# Patient Record
Sex: Female | Born: 1980 | Race: White | Hispanic: Yes | Marital: Married | Smoking: Never smoker
Health system: Southern US, Community
[De-identification: ages and names within clinical notes are randomized; demographics above are authoritative.]

## PROBLEM LIST (undated history)

## (undated) ENCOUNTER — Inpatient Hospital Stay (HOSPITAL_COMMUNITY): Payer: Self-pay

## (undated) DIAGNOSIS — G939 Disorder of brain, unspecified: Secondary | ICD-10-CM

## (undated) DIAGNOSIS — L723 Sebaceous cyst: Secondary | ICD-10-CM

## (undated) DIAGNOSIS — H02409 Unspecified ptosis of unspecified eyelid: Secondary | ICD-10-CM

## (undated) DIAGNOSIS — O24419 Gestational diabetes mellitus in pregnancy, unspecified control: Secondary | ICD-10-CM

## (undated) DIAGNOSIS — E559 Vitamin D deficiency, unspecified: Secondary | ICD-10-CM

## (undated) DIAGNOSIS — E039 Hypothyroidism, unspecified: Secondary | ICD-10-CM

## (undated) DIAGNOSIS — Z8632 Personal history of gestational diabetes: Secondary | ICD-10-CM

## (undated) HISTORY — DX: Personal history of gestational diabetes: Z86.32

## (undated) HISTORY — DX: Disorder of brain, unspecified: G93.9

## (undated) HISTORY — DX: Hypothyroidism, unspecified: E03.9

## (undated) HISTORY — PX: EYE SURGERY: SHX253

## (undated) HISTORY — DX: Vitamin D deficiency, unspecified: E55.9

## (undated) HISTORY — DX: Unspecified ptosis of unspecified eyelid: H02.409

## (undated) HISTORY — DX: Sebaceous cyst: L72.3

---

## 2004-06-01 ENCOUNTER — Encounter: Admission: RE | Admit: 2004-06-01 | Discharge: 2004-06-01 | Payer: Self-pay | Admitting: Obstetrics and Gynecology

## 2004-08-10 ENCOUNTER — Ambulatory Visit (HOSPITAL_COMMUNITY): Admission: RE | Admit: 2004-08-10 | Discharge: 2004-08-10 | Payer: Self-pay | Admitting: *Deleted

## 2004-08-10 ENCOUNTER — Ambulatory Visit: Payer: Self-pay | Admitting: *Deleted

## 2004-08-12 ENCOUNTER — Inpatient Hospital Stay (HOSPITAL_COMMUNITY): Admission: AD | Admit: 2004-08-12 | Discharge: 2004-08-12 | Payer: Self-pay | Admitting: Obstetrics & Gynecology

## 2004-08-14 ENCOUNTER — Inpatient Hospital Stay (HOSPITAL_COMMUNITY): Admission: AD | Admit: 2004-08-14 | Discharge: 2004-08-16 | Payer: Self-pay | Admitting: Obstetrics & Gynecology

## 2004-09-06 ENCOUNTER — Inpatient Hospital Stay (HOSPITAL_COMMUNITY): Admission: AD | Admit: 2004-09-06 | Discharge: 2004-09-06 | Payer: Self-pay | Admitting: Obstetrics and Gynecology

## 2006-06-13 ENCOUNTER — Emergency Department (HOSPITAL_COMMUNITY): Admission: EM | Admit: 2006-06-13 | Discharge: 2006-06-13 | Payer: Self-pay | Admitting: Family Medicine

## 2006-07-22 ENCOUNTER — Encounter (INDEPENDENT_AMBULATORY_CARE_PROVIDER_SITE_OTHER): Payer: Self-pay | Admitting: *Deleted

## 2006-08-01 ENCOUNTER — Ambulatory Visit: Payer: Self-pay | Admitting: Family Medicine

## 2007-01-18 DIAGNOSIS — N926 Irregular menstruation, unspecified: Secondary | ICD-10-CM | POA: Insufficient documentation

## 2007-01-18 DIAGNOSIS — G5600 Carpal tunnel syndrome, unspecified upper limb: Secondary | ICD-10-CM | POA: Insufficient documentation

## 2007-01-19 ENCOUNTER — Encounter (INDEPENDENT_AMBULATORY_CARE_PROVIDER_SITE_OTHER): Payer: Self-pay | Admitting: *Deleted

## 2007-02-21 ENCOUNTER — Encounter: Payer: Self-pay | Admitting: Family Medicine

## 2007-02-27 ENCOUNTER — Ambulatory Visit: Payer: Self-pay | Admitting: Family Medicine

## 2007-02-27 DIAGNOSIS — L723 Sebaceous cyst: Secondary | ICD-10-CM | POA: Insufficient documentation

## 2007-02-27 HISTORY — DX: Sebaceous cyst: L72.3

## 2007-02-27 LAB — CONVERTED CEMR LAB
Hemoglobin: 14.1 g/dL
MCV: 86.8 fL
RBC: 4.77 M/uL
WBC: 7 10*3/uL

## 2007-03-01 LAB — CONVERTED CEMR LAB: Glucose, Bld: 93 mg/dL (ref 70–99)

## 2007-04-10 ENCOUNTER — Telehealth: Payer: Self-pay | Admitting: *Deleted

## 2007-04-11 ENCOUNTER — Ambulatory Visit: Payer: Self-pay | Admitting: Family Medicine

## 2007-04-11 LAB — CONVERTED CEMR LAB
Glucose, Urine, Semiquant: NEGATIVE
Ketones, urine, test strip: NEGATIVE
Nitrite: NEGATIVE
Urobilinogen, UA: 0.2
pH: 6

## 2007-08-07 ENCOUNTER — Encounter: Payer: Self-pay | Admitting: Family Medicine

## 2007-08-07 ENCOUNTER — Ambulatory Visit: Payer: Self-pay | Admitting: Family Medicine

## 2007-08-10 ENCOUNTER — Encounter: Payer: Self-pay | Admitting: Family Medicine

## 2008-04-03 ENCOUNTER — Telehealth: Payer: Self-pay | Admitting: *Deleted

## 2008-04-04 ENCOUNTER — Encounter (INDEPENDENT_AMBULATORY_CARE_PROVIDER_SITE_OTHER): Payer: Self-pay | Admitting: *Deleted

## 2008-04-15 ENCOUNTER — Telehealth: Payer: Self-pay | Admitting: *Deleted

## 2008-04-18 ENCOUNTER — Ambulatory Visit: Payer: Self-pay | Admitting: Family Medicine

## 2008-04-18 ENCOUNTER — Encounter (INDEPENDENT_AMBULATORY_CARE_PROVIDER_SITE_OTHER): Payer: Self-pay | Admitting: *Deleted

## 2008-04-18 DIAGNOSIS — N898 Other specified noninflammatory disorders of vagina: Secondary | ICD-10-CM | POA: Insufficient documentation

## 2008-04-18 DIAGNOSIS — N912 Amenorrhea, unspecified: Secondary | ICD-10-CM

## 2008-04-18 LAB — CONVERTED CEMR LAB
Beta hcg, urine, semiquantitative: NEGATIVE
GC Probe Amp, Genital: NEGATIVE
Whiff Test: NEGATIVE

## 2008-04-21 ENCOUNTER — Encounter (INDEPENDENT_AMBULATORY_CARE_PROVIDER_SITE_OTHER): Payer: Self-pay | Admitting: *Deleted

## 2008-06-19 ENCOUNTER — Ambulatory Visit: Payer: Self-pay | Admitting: Family Medicine

## 2008-06-19 ENCOUNTER — Telehealth: Payer: Self-pay | Admitting: *Deleted

## 2008-09-30 ENCOUNTER — Ambulatory Visit: Payer: Self-pay | Admitting: Gynecology

## 2008-09-30 ENCOUNTER — Encounter: Payer: Self-pay | Admitting: Gynecology

## 2008-09-30 ENCOUNTER — Other Ambulatory Visit: Admission: RE | Admit: 2008-09-30 | Discharge: 2008-09-30 | Payer: Self-pay | Admitting: Gynecology

## 2008-12-09 ENCOUNTER — Ambulatory Visit: Payer: Self-pay | Admitting: Gynecology

## 2008-12-12 ENCOUNTER — Ambulatory Visit: Payer: Self-pay | Admitting: Gynecology

## 2008-12-16 ENCOUNTER — Ambulatory Visit: Payer: Self-pay | Admitting: Gynecology

## 2009-01-14 ENCOUNTER — Encounter: Payer: Self-pay | Admitting: Family Medicine

## 2009-03-03 ENCOUNTER — Ambulatory Visit (HOSPITAL_COMMUNITY): Admission: RE | Admit: 2009-03-03 | Discharge: 2009-03-03 | Payer: Self-pay | Admitting: Family Medicine

## 2009-07-03 ENCOUNTER — Ambulatory Visit: Payer: Self-pay | Admitting: Obstetrics & Gynecology

## 2009-07-09 ENCOUNTER — Inpatient Hospital Stay (HOSPITAL_COMMUNITY): Admission: AD | Admit: 2009-07-09 | Discharge: 2009-07-12 | Payer: Self-pay | Admitting: Family Medicine

## 2009-07-09 ENCOUNTER — Ambulatory Visit: Payer: Self-pay | Admitting: Obstetrics & Gynecology

## 2009-07-10 ENCOUNTER — Encounter: Payer: Self-pay | Admitting: Obstetrics & Gynecology

## 2009-11-13 ENCOUNTER — Telehealth: Payer: Self-pay | Admitting: Family Medicine

## 2010-05-21 ENCOUNTER — Ambulatory Visit: Payer: Self-pay | Admitting: Gynecology

## 2010-05-21 ENCOUNTER — Other Ambulatory Visit: Admission: RE | Admit: 2010-05-21 | Discharge: 2010-05-21 | Payer: Self-pay | Admitting: Gynecology

## 2010-08-12 ENCOUNTER — Ambulatory Visit: Payer: Self-pay | Admitting: Gynecology

## 2010-09-03 ENCOUNTER — Ambulatory Visit: Payer: Self-pay | Admitting: Gynecology

## 2010-10-06 ENCOUNTER — Ambulatory Visit: Payer: Self-pay | Admitting: Gynecology

## 2010-12-26 ENCOUNTER — Encounter: Payer: Self-pay | Admitting: *Deleted

## 2011-01-31 ENCOUNTER — Ambulatory Visit (INDEPENDENT_AMBULATORY_CARE_PROVIDER_SITE_OTHER): Payer: Self-pay | Admitting: Family Medicine

## 2011-01-31 ENCOUNTER — Encounter: Payer: Self-pay | Admitting: Family Medicine

## 2011-01-31 VITALS — BP 105/71 | HR 75 | Temp 98.0°F | Ht 63.0 in | Wt 163.0 lb

## 2011-01-31 DIAGNOSIS — L290 Pruritus ani: Secondary | ICD-10-CM | POA: Insufficient documentation

## 2011-01-31 MED ORDER — HYDROCORTISONE 2.5 % EX CREA: TOPICAL_CREAM | Freq: Two times a day (BID) | CUTANEOUS | Status: DC

## 2011-01-31 NOTE — Progress Notes (Signed)
  Subjective:    Patient ID: Shelly Greene, female    DOB: 04-09-1981, 30 y.o.   MRN: [id number]  HPI  Rectal itching, comes and goes, worried that she has pin worms.  Itching at night but also during the day, hemorrhoids in past with tags.  Children do not have symptoms.    Review of Systems  Constitutional: Negative for fever.  Genitourinary: Negative for dysuria and vaginal discharge.       Objective:   Physical Exam  Constitutional: She appears well-developed and well-nourished.  Genitourinary:       Rectal exam:  Small tags and minor inflammation          Assessment & Plan:

## 2011-01-31 NOTE — Patient Instructions (Signed)
Clean well with water or wipes (Tucks) Use hydrocortisone for 1-2 weeks then as needed

## 2011-01-31 NOTE — Assessment & Plan Note (Signed)
Discussed cleaning after BM, recommended Tucks, added prn hydrocortisone 2.5%

## 2011-02-26 LAB — CBC
HCT: 35.3 % — ABNORMAL LOW (ref 36.0–46.0)
MCHC: 33.2 g/dL (ref 30.0–36.0)
MCV: 89.6 fL (ref 78.0–100.0)
RDW: 13.6 % (ref 11.5–15.5)

## 2011-04-05 NOTE — Op Note (Signed)
NAME:  Shelly Greene, Shelly Greene         ACCOUNT NO.:  [id number]   MEDICAL RECORD NO.:  [id number]          PATIENT TYPE:  INP   LOCATION:  9124                          FACILITY:  [facility]   PHYSICIAN:  Allie Bossier, MD        DATE OF BIRTH:  Feb 03, 1981   DATE OF PROCEDURE:  07/10/2009  DATE OF DISCHARGE:                               OPERATIVE REPORT   PREOPERATIVE DIAGNOSIS:  Retained placenta after vacuum-assisted vaginal  delivery.   POSTOPERATIVE DIAGNOSIS:  Retained placenta after vacuum-assisted  vaginal delivery.   PROCEDURE:  Manual extraction of placenta.   ANESTHESIA:  Epidural.   ANESTHESIOLOGIST:  Belva Agee, MD   COMPLICATIONS:  None.   ESTIMATED BLOOD LOSS:  800 mL.   SPECIMENS:  Placenta.   DETAILED PROCEDURE AND FINDINGS:  Risks, benefits, and alternatives of  surgery were explained, understood, and accepted.  Consents were signed.  She was taken to the operating room.  Her epidural was bolused for  surgery.  Her vagina was prepped and draped in usual sterile fashion  after adequate anesthesia was assured, I reached then and pulled the  placenta out.  During the prepping process, it was obvious that she was  having some bleeding.  She bled about 300 mL during the vaginal prepping  and then immediately after the placenta was delivered, she bled another  300 or 400 mL.  Pitocin and Methergine quickly controlled this vaginal  bleeding.  The uterus firmed up nicely.  She was taken to recovery room  in stable condition.  Instrument, sponge, and needle counts were  correct.  She tolerated the procedure well.  Please note that I believe  she has the uterine abnormality.  This child and her last child were  both carried on her right hand side.  Leopold's showed no evidence of  baby anywhere near the left side of her abdomen.  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  She was  taken to recovery room in stable condition.      Allie Bossier, MD  Electronically Signed     MCD/MEDQ  D:  07/10/2009  T:  07/11/2009  Job:  [id number]

## 2011-04-11 ENCOUNTER — Ambulatory Visit (INDEPENDENT_AMBULATORY_CARE_PROVIDER_SITE_OTHER): Payer: 59 | Admitting: Family Medicine

## 2011-04-11 ENCOUNTER — Encounter: Payer: Self-pay | Admitting: Family Medicine

## 2011-04-11 VITALS — BP 107/75 | HR 73 | Temp 100.4°F | Ht 63.0 in | Wt 154.9 lb

## 2011-04-11 DIAGNOSIS — J020 Streptococcal pharyngitis: Secondary | ICD-10-CM

## 2011-04-11 DIAGNOSIS — J029 Acute pharyngitis, unspecified: Secondary | ICD-10-CM

## 2011-04-11 LAB — POCT RAPID STREP A (OFFICE): Rapid Strep A Screen: POSITIVE — AB

## 2011-04-11 MED ORDER — AMOXICILLIN-POT CLAVULANATE 875-125 MG PO TABS: 1.0000 | ORAL_TABLET | Freq: Two times a day (BID) | ORAL | Status: AC

## 2011-04-11 NOTE — Patient Instructions (Signed)
Take the medication for 1 week.  We will do a referral for you to talk with an Ears Nose Throat doctor to discuss getting a tonsillectomy.

## 2011-04-11 NOTE — Assessment & Plan Note (Addendum)
Pt has a strep throat. She is concerned about having it so often (has had it multiple times in her lifetime and twice in the last 1 year). She is interested in talking to an ENT MD to discuss tonsillectomy.  ENT referral done.  Pt will take Augmentin for the infection and Chloraseptic spray for the pain.

## 2011-04-11 NOTE — Progress Notes (Signed)
Throat pain: Pt started having some throat pain over the weekend (3 days). Her son was sick 4-5 days ago with a sore throat as well but he never had a hard time eating and already feels better and is back in school. She is having a lot of pain with swallowing, has a fever x 1 day and is currently febrile to 100.4. She has a h/o of strep pharyngitis and has to often get antibiotics.   ROS: neg except as noted in HPI.   PE:  Gen: pt is sittng comfortably, has a speech that sound slightly "hot potatoe" voice.  HEENT: NA/AT, EOMI, Ears have clear canals without TM bulging bilaterally, large swollen tonsils with exudates seen, LAD in tonsillar lymph nodes noted CV: RRR, no murmur Pulm: CTAB, no wheezing or crackles.

## 2011-05-24 ENCOUNTER — Encounter (INDEPENDENT_AMBULATORY_CARE_PROVIDER_SITE_OTHER): Payer: 59 | Admitting: Gynecology

## 2011-05-24 ENCOUNTER — Other Ambulatory Visit: Payer: Self-pay | Admitting: Gynecology

## 2011-05-24 ENCOUNTER — Other Ambulatory Visit (HOSPITAL_COMMUNITY)
Admission: RE | Admit: 2011-05-24 | Discharge: 2011-05-24 | Disposition: A | Discharge status: Home / Self Care | Payer: 59 | Source: Ambulatory Visit | Attending: Gynecology | Admitting: Gynecology

## 2011-05-24 DIAGNOSIS — Z833 Family history of diabetes mellitus: Secondary | ICD-10-CM

## 2011-05-24 DIAGNOSIS — Z124 Encounter for screening for malignant neoplasm of cervix: Secondary | ICD-10-CM | POA: Insufficient documentation

## 2011-05-24 DIAGNOSIS — Z01419 Encounter for gynecological examination (general) (routine) without abnormal findings: Secondary | ICD-10-CM

## 2011-06-08 ENCOUNTER — Ambulatory Visit: Payer: 59 | Admitting: Gynecology

## 2011-06-08 ENCOUNTER — Other Ambulatory Visit (INDEPENDENT_AMBULATORY_CARE_PROVIDER_SITE_OTHER): Payer: 59

## 2011-06-08 DIAGNOSIS — O99810 Abnormal glucose complicating pregnancy: Secondary | ICD-10-CM

## 2011-06-08 DIAGNOSIS — R82998 Other abnormal findings in urine: Secondary | ICD-10-CM

## 2011-06-08 DIAGNOSIS — Z13220 Encounter for screening for lipoid disorders: Secondary | ICD-10-CM

## 2011-06-08 DIAGNOSIS — E039 Hypothyroidism, unspecified: Secondary | ICD-10-CM

## 2011-10-19 ENCOUNTER — Other Ambulatory Visit: Payer: Self-pay | Admitting: Gynecology

## 2012-01-04 ENCOUNTER — Other Ambulatory Visit (INDEPENDENT_AMBULATORY_CARE_PROVIDER_SITE_OTHER): Payer: Self-pay | Admitting: Otolaryngology

## 2012-01-04 DIAGNOSIS — R42 Dizziness and giddiness: Secondary | ICD-10-CM

## 2012-01-10 ENCOUNTER — Ambulatory Visit (INDEPENDENT_AMBULATORY_CARE_PROVIDER_SITE_OTHER): Payer: 59 | Admitting: Family Medicine

## 2012-01-10 ENCOUNTER — Encounter: Payer: Self-pay | Admitting: Family Medicine

## 2012-01-10 ENCOUNTER — Telehealth: Payer: Self-pay | Admitting: Family Medicine

## 2012-01-10 VITALS — BP 110/73 | HR 91 | Temp 99.3°F | Ht 63.0 in | Wt 165.0 lb

## 2012-01-10 DIAGNOSIS — B950 Streptococcus, group A, as the cause of diseases classified elsewhere: Secondary | ICD-10-CM

## 2012-01-10 DIAGNOSIS — J029 Acute pharyngitis, unspecified: Secondary | ICD-10-CM

## 2012-01-10 DIAGNOSIS — J020 Streptococcal pharyngitis: Secondary | ICD-10-CM

## 2012-01-10 LAB — POCT RAPID STREP A (OFFICE): Rapid Strep A Screen: POSITIVE — AB

## 2012-01-10 MED ORDER — AMOXICILLIN 875 MG PO TABS: 875.0000 mg | ORAL_TABLET | Freq: Two times a day (BID) | ORAL | Status: AC

## 2012-01-10 MED ORDER — CHLORPHENIRAMINE-PSEUDOEPH 12-100 MG PO CP24: 1.0000 | ORAL_CAPSULE | Freq: Two times a day (BID) | ORAL | Status: AC

## 2012-01-10 MED ORDER — CHLORPHENIRAMINE-PSEUDOEPH 12-100 MG PO CP24: 1.0000 | ORAL_CAPSULE | Freq: Two times a day (BID) | ORAL | Status: DC

## 2012-01-10 MED ORDER — AMOXICILLIN-POT CLAVULANATE 875-125 MG PO TABS: 1.0000 | ORAL_TABLET | Freq: Two times a day (BID) | ORAL | Status: DC

## 2012-01-10 NOTE — Assessment & Plan Note (Signed)
Although patient had a cough making strep low on the differential, rapid strep was done as point of care triage lab and was found to be positive. Was initially planning on treating for sinusitis since symptoms have been lasting for 3 weeks with waxing and waning potentially with a second sickness syndrome. 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

## 2012-01-10 NOTE — Telephone Encounter (Signed)
Called patient and appointment is scheduled for 4:00 PM today.

## 2012-01-10 NOTE — Telephone Encounter (Signed)
Has fever/chills/congestion/cough - getting worse - has had for 2 weeks Wants to know what she should do. No appt available today

## 2012-01-10 NOTE — Patient Instructions (Signed)
Since you have been having symptoms for 3 weeks, this could be a bacterial sinusitis that we are going to treat with antibiotics that you need to take for 5 days twice daily. Also, you can take a pill called chlorpheniramine with pseudoephedrine that will help with the congestion.

## 2012-01-10 NOTE — Progress Notes (Signed)
Patient ID: Shelly Greene, female   DOB: 09/04/1981, 31 y.o.   MRN: [id number] Patient ID: Shelly Greene    DOB: Apr 08, 1981, 31 y.o.   MRN: [id number] --- Subjective:  Shelly Greene is a 31 y.o.female who presents with 3 week history of cough, congestion, chest congestion and who started having chills, body aches and temporal headache this morning. She has not taken her temperature but thinks she has had chills. She has been eating and drinking normally. Feels like the symptoms wax and wane in terms of severity. Has taken ibuprofen which has stopped being as effective for headache. Started taking zyrtec a couple of days ago. She has not had the flu shot this season.    Objective: Filed Vitals:   01/10/12 1610  BP: 110/73  Pulse: 91  Temp: 99.3 F (37.4 C)    Physical Examination:   General appearance - alert, well appearing, and in no distress. Visibly congested.  Ears - bilateral TM's and external ear canals normal, TM a little dull but no pus seen Nose - normal and patent, no erythema, erythematous turbinates with drainage Mouth - mucous membranes moist, pharynx normal without lesions, no tonsillar exudates, erythematous oropharynx. Neck - supple, no significant adenopathy Face: no pain along sinuses Chest - clear to auscultation, no wheezes, rales or rhonchi, symmetric air entry Heart - normal rate, regular rhythm, normal S1, S2, no murmurs, rubs, clicks or gallops Abdomen - soft, nontender, nondistended, no masses or organomegaly Extremities - peripheral pulses normal, no pedal edema, no clubbing or cyanosis

## 2012-01-11 ENCOUNTER — Other Ambulatory Visit: Payer: 59

## 2012-01-19 ENCOUNTER — Ambulatory Visit
Admission: RE | Admit: 2012-01-19 | Discharge: 2012-01-19 | Disposition: A | Discharge status: Home / Self Care | Payer: 59 | Source: Ambulatory Visit | Attending: Otolaryngology | Admitting: Otolaryngology

## 2012-01-19 DIAGNOSIS — R42 Dizziness and giddiness: Secondary | ICD-10-CM

## 2012-01-19 MED ORDER — GADOBENATE DIMEGLUMINE 529 MG/ML IV SOLN
15.0000 mL | Freq: Once | INTRAVENOUS | Status: AC | PRN
  Administered 2012-01-19: 15 mL via INTRAVENOUS

## 2012-01-23 ENCOUNTER — Other Ambulatory Visit: Payer: Self-pay | Admitting: Gynecology

## 2012-03-01 ENCOUNTER — Telehealth: Payer: Self-pay | Admitting: Family Medicine

## 2012-03-01 NOTE — Telephone Encounter (Signed)
Called pt and she reports, that she has been taking the amoxicillin and finished it. Still congested. Denies fever, sore throat. I advised the pt that we do not prescribe ABX without office visit. Pt does not want to be seen. Advised pt to try OTC medications for congestion. Pt agreed. Lorenda Hatchet, Renato Battles

## 2012-03-01 NOTE — Telephone Encounter (Signed)
Patient says she was seen about a month ago and was given a rx for sinuses but she never got it filled and has lost rx.  She wants to know if she can get another prescription because she is still having sinus problems.

## 2012-03-22 ENCOUNTER — Other Ambulatory Visit: Payer: Self-pay | Admitting: *Deleted

## 2012-03-22 DIAGNOSIS — G9389 Other specified disorders of brain: Secondary | ICD-10-CM

## 2012-03-29 ENCOUNTER — Ambulatory Visit
Admission: RE | Admit: 2012-03-29 | Discharge: 2012-03-29 | Disposition: A | Discharge status: Home / Self Care | Payer: 59 | Source: Ambulatory Visit | Attending: *Deleted | Admitting: *Deleted

## 2012-03-29 ENCOUNTER — Other Ambulatory Visit: Payer: 59

## 2012-03-29 DIAGNOSIS — G9389 Other specified disorders of brain: Secondary | ICD-10-CM

## 2012-03-29 MED ORDER — GADOBENATE DIMEGLUMINE 529 MG/ML IV SOLN
15.0000 mL | Freq: Once | INTRAVENOUS | Status: AC | PRN
  Administered 2012-03-29: 15 mL via INTRAVENOUS

## 2012-03-31 ENCOUNTER — Ambulatory Visit
Admission: RE | Admit: 2012-03-31 | Discharge: 2012-03-31 | Disposition: A | Discharge status: Home / Self Care | Payer: 59 | Source: Ambulatory Visit | Attending: *Deleted | Admitting: *Deleted

## 2012-03-31 DIAGNOSIS — G9389 Other specified disorders of brain: Secondary | ICD-10-CM

## 2012-03-31 MED ORDER — GADOBENATE DIMEGLUMINE 529 MG/ML IV SOLN
15.0000 mL | Freq: Once | INTRAVENOUS | Status: AC | PRN
  Administered 2012-03-31: 15 mL via INTRAVENOUS

## 2012-05-22 ENCOUNTER — Encounter: Payer: Self-pay | Admitting: Family Medicine

## 2012-05-22 ENCOUNTER — Ambulatory Visit (INDEPENDENT_AMBULATORY_CARE_PROVIDER_SITE_OTHER): Payer: 59 | Admitting: Family Medicine

## 2012-05-22 VITALS — BP 99/63 | HR 87 | Temp 98.0°F | Ht 63.0 in | Wt 158.3 lb

## 2012-05-22 DIAGNOSIS — J029 Acute pharyngitis, unspecified: Secondary | ICD-10-CM

## 2012-05-22 DIAGNOSIS — J020 Streptococcal pharyngitis: Secondary | ICD-10-CM

## 2012-05-22 LAB — POCT RAPID STREP A (OFFICE): Rapid Strep A Screen: POSITIVE — AB

## 2012-05-22 MED ORDER — AMOXICILLIN 875 MG PO TABS
875.0000 mg | ORAL_TABLET | Freq: Two times a day (BID) | ORAL | Status: AC
Start: 2012-05-22 — End: 2012-06-01

## 2012-05-22 NOTE — Assessment & Plan Note (Addendum)
Today's presentation clinically consistent with strep pharyngitis.  Will treat amoxicillin 875 bid x 10 days.  Discussed with patient, may consider testing rapid strep next time she is in the office without sore throat to determine if she is a carrier- may be able to avoid repeated antibiotics for cases more consistent with viral pharyngitis.

## 2012-05-22 NOTE — Patient Instructions (Addendum)

## 2012-05-22 NOTE — Progress Notes (Signed)
  Subjective:    Patient ID: Shelly Greene, female    DOB: 1981-07-15, 31 y.o.   MRN: [id number]  HPI Work in for sore throat  Started last night. Woke up this morning with worsening soreness.  No fever, chills, cough.  Feels postnasal drainage, bad taste in mouth.  Has history of positive strep in February with 10 day course of amoxicillin.  Also had a positive in May 2012.  No household contacts sick.    Review of Systemssee HPI     Objective:   Physical Exam GEN: Alert & Oriented, No acute distress HEENT: [place]/AT. EOMI, PERRLA, no conjunctival injection or scleral icterus.  Bilateral tympanic membranes intact without erythema or effusion.  .  Nares without edema or rhinorrhea.  Oropharynx is with erythema , tonsillar swelling,  Left tonsillar exudate.  No anterior or posterior cervical lymphadenopathy. CV:  Regular Rate & Rhythm, no murmur Respiratory:  Normal work of breathing, CTAB Abd:  + BS, soft, no tenderness to palpation Ext: no pre-tibial edema       Assessment & Plan:

## 2012-08-06 ENCOUNTER — Ambulatory Visit (INDEPENDENT_AMBULATORY_CARE_PROVIDER_SITE_OTHER): Payer: 59 | Admitting: Gynecology

## 2012-08-06 ENCOUNTER — Encounter: Payer: Self-pay | Admitting: Gynecology

## 2012-08-06 VITALS — BP 104/72 | Ht 64.0 in | Wt 151.0 lb

## 2012-08-06 DIAGNOSIS — E039 Hypothyroidism, unspecified: Secondary | ICD-10-CM

## 2012-08-06 DIAGNOSIS — G9389 Other specified disorders of brain: Secondary | ICD-10-CM

## 2012-08-06 DIAGNOSIS — G939 Disorder of brain, unspecified: Secondary | ICD-10-CM | POA: Insufficient documentation

## 2012-08-06 DIAGNOSIS — Z01419 Encounter for gynecological examination (general) (routine) without abnormal findings: Secondary | ICD-10-CM

## 2012-08-06 DIAGNOSIS — R635 Abnormal weight gain: Secondary | ICD-10-CM

## 2012-08-06 LAB — CBC WITH DIFFERENTIAL/PLATELET
Basophils Absolute: 0.1 10*3/uL (ref 0.0–0.1)
Eosinophils Absolute: 0.6 10*3/uL (ref 0.0–0.7)
Eosinophils Relative: 10 % — ABNORMAL HIGH (ref 0–5)
Lymphocytes Relative: 33 % (ref 12–46)
MCH: 29.5 pg (ref 26.0–34.0)
MCV: 86.5 fL (ref 78.0–100.0)
Neutrophils Relative %: 49 % (ref 43–77)
Platelets: 216 10*3/uL (ref 150–400)
RDW: 13.3 % (ref 11.5–15.5)
WBC: 5.8 10*3/uL (ref 4.0–10.5)

## 2012-08-06 LAB — LIPID PANEL: LDL Cholesterol: 113 mg/dL — ABNORMAL HIGH (ref 0–99)

## 2012-08-06 NOTE — Patient Instructions (Addendum)
Control del colesterol  Los niveles de colesterol en el organismo estn determinados significativamente por su dieta. Los niveles de colesterol tambin se relacionan con la enfermedad cardaca. El material que sigue ayuda a explicar esta relacin y a analizar qu puede hacer para mantener su corazn sano. No todo el colesterol es malo. Las lipoprotenas de baja densidad (LDL) forman el colesterol "malo". El colesterol malo puede ocasionar depsitos de grasa que se acumulan en el interior de las arterias. Las lipoprotenas de alta densidad (HDL) es el colesterol "bueno". Ayuda a remover el colesterol LDL "malo" de la sangre. El colesterol es un factor de riesgo muy importante para la enfermedad cardaca. Otros factores de riesgo son la hipertensin arterial, el hbito de fumar, el estrs, la herencia y el peso.  El msculo cardaco obtiene el suministro de sangre a travs de las arterias coronarias. Si su colesterol LDL ("malo") est elevado y el HDL ("bueno") es bajo, tiene un factor de riesgo para que se formen depsitos de grasa en las arterias coronarias (los vasos sanguneos que suministran sangre al corazn). Esto hace que haya menos lugar para que la sangre circule. Sin la suficiente sangre y oxgeno, el msculo cardaco no puede funcionar correctamente, y usted podr sentir dolores en el pecho (angina pectoris). Cuando una arteria coronaria se cierra completamente, una parte del msculo cardaco puede morir (infarto de miocardio). CONTROL DEL COLESTEROL Cuando el profesional que lo asiste enva la sangre al laboratorio para conocer el nivel de colesterol, puede realizarle tambin un perfil completo de los lpidos. Con esta prueba, se puede determinar la cantidad total de colesterol, as como los niveles de LDL y HDL. Los triglicridos son un tipo de grasa que circula en la sangre y que tambin puede utilizarse para determinar el riesgo de enfermedad  cardaca. En la siguiente tabla se establecen los nmeros ideales: Prueba: Colesterol total  Menos de 200 mg/dl.  Prueba: LDL "colesterol malo"  Menos de 100 mg/dl.   Menos de 70 mg/dl si tiene riesgo muy elevado de sufrir un ataque cardaco o muerte cardaca sbita.  Prueba: HDL "colesterol bueno"  Mujeres: Ms de 50 mg/dl.   Hombres: Ms de 40 mg/dl.  Prueba: Trigliceridos  Menos de 150 mg/dl.  CONTROL DEL COLESTEROL CON DIETA Aunque factores como el ejercicio y el estilo de vida son importantes, la "primera lnea de ataque" es la dieta. Esto se debe a que se sabe que ciertos alimentos hacen subir el colesterol y otros lo bajan. El objetivo debe ser equilibrar los alimentos, de modo que tengan un efecto sobre el colesterol y, an ms importante, reemplazar las grasas saturadas y trans con otros tipos de grasas, como las monoinsaturadas y las poliinsaturadas y cidos grasos omega-3 . En promedio, una persona no debe consumir ms de 15 a 17 g de grasas saturadas por da. Las grasas saturadas y trans se consideran grasas "malas", ya que elevan el colesterol LDL. Las grasas saturadas se encuentran principalmente en productos animales como carne, manteca y crema. Pero esto no significa que usted debe sacrificar todas sus comidas favoritas. Actualmente, como lo muestra el cuadro que figura al final de este documento, hay sustitutos de buen sabor, bajos en grasas y en colesterol, para la mayora de los alimentos que a usted le gusta comer. Elija aquellos alimentos alternativos que sean bajos en grasas o sin grasas. Elija cortes de carne del cuarto trasero o lomo ya que estos cortes son los que tienen menor cantidad de grasa y colesterol. El pollo (  sin piel), el pescado, la carne de ternera, y la pechuga de pavo molida son excelentes opciones. Elimine las carnes grasosas como los hotdogs o el salami. Los mariscos tienen poco o nada de grasas saturadas. Cuando consuma carne magra, carne de aves de  corral, o pescado, hgalo en porciones de 85 gramos (3 onzas). Las grasas trans tambin se llaman "aceites parcialmente hidrogenados". Son aceites manipulados cientficamente de modo que son slidos a temperatura ambiente, tienen una larga vida y mejoran el sabor y la textura de los alimentos a los que se agregan. Las grasas trans se encuentran en la margarina, masitas, crackers y alimentos horneados.  Para hornear y cocinar, el aceite es un excelente sustituto para la mantequilla. Los aceites monoinsaturados tienen un beneficio particular, ya que se cree que disminuyen el colesterol LDL (colesterol malo) y elevan el HDL. Deber evitar los aceites tropicales saturados como el de coco y el de palma.  Recuerde, adems, que puede comer sin restricciones los grupos de alimentos que son naturalmente libres de grasas saturadas y grasas trans, entre los que se incluyen el pescado, las frutas (excepto el aguacate), verduras, frijoles, cereales (cebada, arroz, cuzcuz, trigo) y las pastas (sin salsas con crema)  IDENTIFIQUE LOS ALIMENTOS QUE DISMINUYEN EL COLESTEROL  Pueden disminuir el colesterol las fibras solubles que estn en las frutas, como las manzanas, en los vegetales como el brcoli, las patatas y las zanahorias; en las legumbres como frijoles, guisantes y lentejas; y en los cereales como la cebada. Los alimentos fortificados con fitosteroles tambin pueden disminuir el colesterol. Debe consumir al menos 2 g de estos alimentos a diario para obtener el efecto de disminucin de colesterol.  En el supermercado, lea las etiquetas de los envases para identificar los alimentos bajos en grasas saturadas, libres de grasas trans y bajos en grasas, . Elija quesos que tengan solo de 2 a 3 g de grasa saturada por onza (28,35 g). Use una margarina que no dae el corazn, libre de grasas trans o aceite parcialmente hidrogenado. Al comprar alimentos horneados (galletitas dulces y galletas) evite el aceite parcialmente  hidrogenado. Los panes y bollos debern ser de granos enteros (harina de maz o de avena entera, en lugar de "harina" o "harina enriquecida"). Compre sopas en lata que no sean cremosas, con bajo contenido de sal y sin grasas adicionadas.  TCNICAS DE PREPARACIN DE LOS ALIMENTOS  Nunca fra los alimentos en aceite abundante. Si debe frer, hgalo en poco aceite y removiendo constantemente, porque as se utilizan muy pocas grasas, o utilice un spray antiadherente. Cuando le sea posible, hierva, hornee o ase las carnes y cocine los vegetales al vapor. En vez de aderezar los vegetales con mantequilla o margarina, utilice limn y hierbas, pur de manzanas y canela (para las calabazas y batatas), yogurt y salsa descremados y aderezos para ensaladas bajos en contenido graso.  BAJO EN GRASAS SATURADAS / SUSTITUTOS BAJOS EN GRASA  Carnes / Grasas saturadas (g)  Evite: Bife, corte graso (3 oz/85 g) / 11 g   Elija: Bife, corte magro (3 oz/85 g) / 4 g   Evite: Hamburguesa (3 oz/85 g) / 7 g   Elija:  Hamburguesa magra (3 oz/85 g) / 5 g   Evite: Jamn (3 oz/85 g) / 6 g   Elija:  Jamn magro (3 oz/85 g) / 2.4 g   Evite: Pollo, con piel (3 oz/85 g), Carne oscura / 4 g   Elija:  Pollo, sin piel (3 oz/85 g), Carne oscura / 2   g   Evite: Pollo, con piel (3 oz/85 g), Carne magra / 2.5 g   Elija: Pollo, sin piel (3 oz/85 g), Carne magra / 1 g  Lcteos / Grasas saturadas (g)  Evite: Leche entera (1 taza) / 5 g   Elija: Leche con bajo contenido de grasa, 2% (1 taza) / 3 g   Elija: Leche con bajo contenido de grasa, 1% (1 taza) / 1.5 g   Elija: Leche descremada (1 taza) / 0.3 g   Evite: Queso duro (1 oz/28 g) / 6 g   Elija: Queso descremado (1 oz/28 g) / 2-3 g   Evite: Queso cottage, 4% grasa (1 taza)/ 6.5 g   Elija: Queso cottage con bajo contenido de grasa, 1% grasa (1 taza)/ 1.5 g   Evite: Helado (1 taza) / 9 g   Elija: Sorbete (1 taza) / 2.5 g   Elija: Yogurt helado sin contenido de grasa  (1 taza) / 0.3 g   Elija: Barras de fruta congeladas / vestigios   Evite: Crema batida (1 cucharada) / 3.5 g   Elija: Batidos glac sin lcteos (1 cucharada) / 1 g  Condimentos / Grasas saturadas (g)  Evite: Mayonesa (1 cucharada) / 2 g   Elija: Mayonesa con bajo contenido de grasa (1 cucharada) / 1 g   Evite: Manteca (1 cucharada) / 7 g   Elija: Margarina extra light (1 cucharada) / 1 g   Evite: Aceite de coco (1 cucharada) / 11.8 g   Elija: Aceite de oliva (1 cucharada) / 1.8 g   Elija: Aceite de maz (1 cucharada) / 1.7 g   Elija: Aceite de crtamo (1 cucharada) / 1.2 g   Elija: Aceite de girasol (1 cucharada) / 1.4 g   Elija: Aceite de soja (1 cucharada) / 2.4 g   Elija: Aceite de canola (1 cucharada) / 1 g  Document Released: 11/07/2005 Document Revised: 07/20/2011 ExitCare Patient Information 2012 [employer].  Ejercicios para perder peso (Exercise to Lose Weight) La actividad fsica y una dieta saludable ayudan a perder peso. El mdico podr sugerirle ejercicios especficos. IDEAS Y CONSEJOS PARA HACER EJERCICIOS  Elija opciones econmicas que disfrute hacer , como caminar, andar en bicicleta o los vdeos para ejercitarse.   Utilice las escaleras en lugar del ascensor.   Camine durante la hora del almuerzo.   Estacione el auto lejos del lugar de trabajo o estudio.   Concurra a un gimnasio o tome clases de gimnasia.   Comience con 5  10 minutos de actividad fsica por da. Ejercite hasta 30 minutos, 4 a 6 das por semana.   Utilice zapatos que tengan un buen soporte y ropas cmodas.   Elongue antes y despus de ejercitar.   Ejercite hasta que aumente la respiracin y el corazn palpite rpido.   Beba agua extra cuando ejercite.   No haga ejercicio hasta lastimarse, sentirse mareado o que le falte mucho el aire.  La actividad fsica puede quemar alrededor de 150 caloras.  Correr 20 cuadras en 15 minutos.   Jugar vley durante 45 a 60 minutos.     Limpiar y encerar el auto durante 45 a 60 minutos.   Jugar ftbol americano de toque.   Caminar 25 cuadras en 35 minutos.   Empujar un cochecito 20 cuadras en 30 minutos.   Jugar baloncesto durante 30 minutos.   Rastrillar hojas secas durante 30 minutos.   Andar en bicicleta 80 cuadras en 30 minutos.   Caminar 30 cuadras en   30 minutos.   Bailar durante 30 minutos.   Quitar la nieve con una pala durante 15 minutos.   Nadar vigorosamente durante 20 minutos.   Subir escaleras durante 15 minutos.   Andar en bicicleta 60 cuadras durante 15 minutos.   Arreglar el jardn entre 30 y 45 minutos.   Saltar a la soga durante 15 minutos.   Limpiar vidrios o pisos durante 45 a 60 minutos.  Document Released: 02/11/2011 Document Revised: 07/20/2011 ExitCare Patient Information 2012 [employer].    Vacuna contra la difteria, el ttanos, y la tos ferina Lo que usted necesita saber (Diphtheria, Tetanus, and Pertussis [DTaP] Vaccine) POR QU VACUNARSE? La difteria, el ttano y la tos ferina son enfermedades graves provocadas por bacterias. La difteria y la tos ferina se contagian de persona a persona. El ttano ingresa al cuerpo a travs de cortes o heridas. La difteria produce un recubrimiento denso en la parte trasera de la garganta.  Puede producir problemas para respirar, parlisis, insuficiencia cardaca, e incluso la muerte.  El ttanos causa contracturas dolorosas de los msculos, a menudo en todo el cuerpo.  Puede ocasionar un "bloqueo" de la mandbula, de modo que es imposible abrir la boca o tragar. El ttanos produce la muerte en alrededor de 2 cada 10 casos.  La tos ferina produce ataques de tos tan fuertes que, en nios, imposibilita comer, beber o respirar. Estos ataques pueden durar semanas.  Puede producir neumona, convulsiones (ataques de espasmos o ausencias), dao cerebral, y la muerte.  La vacuna para la difteria, el ttano y la tos ferina (DTPa) puede  ayudar a prevenir estas enfermedades. La mayor parte de los nios que la reciben estarn protegidos durante toda su niez. Muchos ms nios padeceran estas enfermedades si no fueran vacunados. La DTPa es una versin ms segura de una vacuna anterior denominada DTP. La DTP se ha dejado de usar en los [place]. QUIN DEBE RECIBIR ESTA VACUNA Y CUNDO? Los nios deberan recibir 5 dosis de la vacuna DTPa, una dosis en cada una de las siguientes edades:  2 meses.   4 meses.   6 meses.   15 a 18 meses.   4 a 6 aos.  La vacuna DTPa puede darse en simultneo con otras vacunas. ALGUNOS NIOS NO DEBERAN DARSE LA VACUNA DTPA O DEBERAN ESPERAR  Aquellos nios con trastornos menores, tales como resfros, pueden ser vacunados, pero aquellos con trastornos moderados a graves deberan esperar hasta su recuperacin para recibir la vacuna DTPa.   Cualquier nio que haya tenido una reaccin alrgica grave luego de una dosis de DTPa no debera recibir otra dosis.   Cualquier nio que haya sufrido una enfermedad cerebral o del sistema nervioso luego de 7 das de haber recibido una dosis de la vacuna DTPa, no debera recibir otra dosis.   Hable con el mdico si el nio:   Ha tenido convulsiones o sufri un colapso luego de una dosis de DTPa.   Ha llorado sin parar durante 3 horas o ms luego de una dosis de DTPa.   Ha tenido fiebre mayor a 105 F (40.6 C) luego de una dosis de DTPa.   Pida ms informacin al profesional que lo asiste. Algunos de estos nios podrn recibir una vacuna que no protege para la tos ferina, llamada DT.  NIOS DE MAYOR EDAD Y ADULTOS  La vacuna DTPa no se administra en adolescentes, adultos, o nios mayores a los 7 aos de edad.   Sin embargo, estas personas an   requieren proteccin. Existe una vacuna llamada Tdap, que es similar a la DTPa. Se recomienda una dosis nica de Tdap en personas desde los 11 a los 64 aos de edad. Otra vacuna, llamada Td, provee  proteccin contra el ttanos y la difteria, pero no contra la tos ferina. Se recomienda su aplicacin cada 10 aos.  CULES SON LOS RIESGOS DE LA VACUNA DTPA?  Enfermarse de difteria, ttanos o pertusis es mucho ms peligroso que recibir la vacuna DTPa.   Sin embargo, una vacuna, como cualquier otro medicamento, puede causar problemas serios, como reacciones alrgicas graves. El riesgo de que la vacuna DTPa cause daos graves o la muerte es extremadamente pequeo.  Problemas leves (comunes)  Fiebre (en hasta 1 de cada 4 nios).   Enrojecimiento o inflamacin en el lugar en el que se dio la inyeccin (en hasta 1 de cada 4 nios).   Dolor o sensibilidad en el lugar en el que se dio la inyeccin (en hasta 1 de cada 4 nios).  Estos problemas ocurren ms a menudo luego de la cuarta y quinta dosis de vacuna DTPa que en las dosis anteriores. En ocasiones luego de la cuarta o quinta dosis se observa la inflamacin de la pierna o brazo completo en que se ha dado la inyeccin, y puede durar de 1 a 7 das (en hasta 1 nio de cada 30). Otros problemas leves incluyen:  Irritabilidad (en hasta 1 de cada 3 nios).   Cansancio o falta de apetito (en hasta 1 de cada 10 nios).   Vmitos (en hasta 1 de cada 50 nios).  Estos problemas ocurren generalmente de 1 a 3 das luego de la inyeccin. Problemas moderados (poco frecuentes)  Convulsiones (sacudones o fijacin de la mirada) (en hasta 1 de cada 14.000 nios).   Llanto sin parar durante 3 horas o ms (en hasta 1 nio de cada 1.000).   Fiebre alta, mayor a 105 F (40.6 C) (alrededor de 1 nio cada 16.000).  Problemas graves (muy raros)  Reaccin alrgica grave (menos de 1 por milln de dosis).   Se han informado varios otros problemas graves luego de la aplicacin de la vacuna DTPa. Estos incluyen:   Convulsiones a largo plazo, coma, o reduccin de la conciencia.   Dao permanente al cerebro.  Estos son casos tan poco frecuentes que resulta  difcil saber si fueron provocados por la vacuna. Controlar la fiebre es particularmente importante para los nios que han tenido convulsiones, por cualquier motivo. Tambin es importante si otro miembro de la familia ha tenido convulsiones. Puede reducir la fiebre y el dolor dando al nio un analgsico sin aspirina al recibir la vacuna, y durante las siguientes 24 horas, segn las instrucciones del medicamento. QU PASA SI HAY UNA REACCIN MODERADA O GRAVE? A qu debo prestar atencin? Cualquier cosa extraa o poco comn, como una reaccin alrgica, fiebre alta o comportamiento extrao. Es muy poco comn que ocurran reacciones alrgicas graves con cualquier vacuna. Si se produjera una, sera dentro de los primeros minutos hasta algunas horas luego de la inyeccin. Podr observar dificultad para respirar, ronquera o silbidos al respirar, ronchas, palidez, debilidad, frecuencia cardaca elevada, o mareos. Si ocurrieran fiebre o convulsiones, normalmente sera dentro de la primera semana luego de la inyeccin. Qu debo hacer?  Comunquese con el mdico o lleve inmediatamente a la persona a un mdico.   Diga al mdico lo que ocurri, la fecha y hora en que ocurri, y cundo recibi la vacuna.   Pida al   mdico, enfermera, o al servicio de salud que complete el informe sobre los efectos adversos de la vacuna (Vaccine Adverse Event Reporting System, VAERS). O, bien puede completar el informe a travs del sitio web de VAERS en www.vaers.hhs.gov o llamando al 1-800-822-7967.  VAERS no proporciona consejos mdicos. EL PROGRAMA NACIONAL DE COMPENSACIN POR LESIONES CAUSADAS POR VACUNAS (NATIONAL VACCINE INJURY COMPENSATION PROGRAM)  En el raro caso en que usted o su hijo hayan tenido una reaccin grave a una vacuna, se ha creado un programa federal para ayudarlo a pagar la atencin de los lesionados.   Para obtener detalles acerca del Programa Nacional de Compensacin por Lesiones Causadas por Vacunas,  llame al 1-800-338-2382 o visite el sitio web del programa en www.hrsa.gov/vaccinecompensation  CMO OBTENER MS INFORMACIN?  Consulte con el profesional que lo asiste. Podr darle el prospecto de la vacuna o sugerirle otras fuentes de informacin.   Llame al programa de vacunacin del departamento de salud local o estatal.   Comunquese con los Centers for Disease Control and Prevention (Centros para el control y la prevencin de enfermedades, CDC).   Llame al 1-800-232-4636 (1-800-CDC-INFO).   Visite el sitio web del Programa Nacional de Vacunacin, en www.cdc.gov/vaccines  CDC Diphtheria, Tetanus, and Pertussis-Spanish VIS (04/06/06) Document Released: 02/03/2009 Document Revised: 10/27/2011 ExitCare Patient Information 2012 [employer]. 

## 2012-08-06 NOTE — Progress Notes (Signed)
Shelly Greene 11-14-1981 [id number]   History:    31 y.o.  for annual gyn exam who had a Mirena IUD placed in 2010 and has done well. Patient states that she frequently does her self breast examination. Patient has a history of hypothyroidism for which she is on Synthroid 50 mcg daily. Patient was seen by [facility] neurosurgeon who eventually referred the patient to [facility] as a result of a right cerebellar and brainstem lesion as a result of patient's double vision and dizziness. She scheduled to followup with them in October of this year. Patient has been sitting on the fence whether to proceed with surgery or radiation. She has a followup MRI next month. Patient would know prior history of abnormal Pap smears her last normal Pap smear was in 2012.  Past medical history,surgical history, family history and social history were all reviewed and documented in the EPIC chart.  Gynecologic History No LMP recorded. Patient is not currently having periods (Reason: IUD). Contraception: IUD Last Pap: 2012. Results were: normal Last mammogram: No prior study. Results were: No prior study  Obstetric History OB History    Grav Para Term Preterm Abortions TAB SAB Ect Mult Living   2 2 2       2      # Outc Date GA Lbr Len/2nd Wgt Sex Del Anes PTL Lv   1 TRM            2 TRM                ROS: A ROS was performed and pertinent positives and negatives are included in the history.  GENERAL: No fevers or chills. HEENT: No change in vision, no earache, sore throat or sinus congestion. NECK: No pain or stiffness. CARDIOVASCULAR: No chest pain or pressure. No palpitations. PULMONARY: No shortness of breath, cough or wheeze. GASTROINTESTINAL: No abdominal pain, nausea, vomiting or diarrhea, melena or bright red blood per rectum. GENITOURINARY: No urinary frequency, urgency, hesitancy or dysuria. MUSCULOSKELETAL: No joint or muscle pain, no back pain, no recent trauma. DERMATOLOGIC:  No rash, no itching, no lesions. ENDOCRINE: No polyuria, polydipsia, no heat or cold intolerance. No recent change in weight. HEMATOLOGICAL: No anemia or easy bruising or bleeding. NEUROLOGIC: No headache, seizures, numbness, tingling or weakness. PSYCHIATRIC: No depression, no loss of interest in normal activity or change in sleep pattern.     Exam: chaperone present  BP 104/72  Ht 5\' 4"  (1.626 m)  Wt 151 lb (68.493 kg)  BMI 25.92 kg/m2  Body mass index is 25.92 kg/(m^2).  General appearance : Well developed well nourished female. No acute distress HEENT: Neck supple, trachea midline, no carotid bruits, no thyroidmegaly Lungs: Clear to auscultation, no rhonchi or wheezes, or rib retractions  Heart: Regular rate and rhythm, no murmurs or gallops Breast:Examined in sitting and supine position were symmetrical in appearance, no palpable masses or tenderness,  no skin retraction, no nipple inversion, no nipple discharge, no skin discoloration, no axillary or supraclavicular lymphadenopathy Abdomen: no palpable masses or tenderness, no rebound or guarding Extremities: no edema or skin discoloration or tenderness  Pelvic:  Bartholin, Urethra, Skene Glands: Within normal limits             Vagina: No gross lesions or discharge  Cervix: No gross lesions or discharge, IUD string seen  Uterus  anteverted, normal size, shape and consistency, non-tender and mobile  Adnexa  Without masses or tenderness  Anus and perineum  normal  Rectovaginal  normal sphincter tone without palpated masses or tenderness             Hemoccult not done   MRI report from May 2013:  IMPRESSION:  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     Assessment/Plan:  31 y.o. female for annual exam with history  of hypothyroidism and recent weight gain. The following labs will be drawn today: TSH, CBC, blood sugar, lipid panel, and urinalysis. We discussed the new Pap smear screening guidelines and she will not need one today. I have asked her to obtain a copy of her next month visit with the neurosurgeon at [facility] outlining her full diagnosis and their  plan of followup so that we can update our records. She was encouraged to do her monthly self breast examination.    Ok Edwards MD, 12:21 PM 08/06/2012

## 2012-08-07 LAB — URINALYSIS W MICROSCOPIC + REFLEX CULTURE
Casts: NONE SEEN
Crystals: NONE SEEN
Leukocytes, UA: NEGATIVE
Nitrite: NEGATIVE
Specific Gravity, Urine: 1.022 (ref 1.005–1.030)
pH: 5.5 (ref 5.0–8.0)

## 2012-08-09 ENCOUNTER — Other Ambulatory Visit: Payer: Self-pay | Admitting: Gynecology

## 2012-08-27 ENCOUNTER — Telehealth: Payer: Self-pay | Admitting: *Deleted

## 2012-08-27 NOTE — Telephone Encounter (Signed)
Pt is called requesting to speak with you regarding her neurosurgeon at [facility]. Call back # [phone] pt cell #

## 2012-08-28 NOTE — Telephone Encounter (Signed)
Spoke with patient today as per my conversation with her neurosurgeon at [facility] who has been following patient for brain lesion. Since patient is from [place] and also this being an endemic area for parasites (recent CBC with  Eosinophils elevated 50% of normal) the possibility of this lesion being one of neurocysticercosis should be considered in the differential diagnoses. Patient has a followup MRI first week of November and they will be making arrangements to discuss her case with infectious disease specialist there. I have spoken with infectious disease here in [place] at [place] and Dr. Ninetta Lights will be in contact with a world known authority on cysticercosis as well. I have related all this information to the patient and we will wait after her appointment with the neurosurgeon and infectious disease at [facility] next month.

## 2012-12-08 ENCOUNTER — Encounter: Payer: Self-pay | Admitting: Family Medicine

## 2013-08-14 ENCOUNTER — Encounter: Payer: Self-pay | Admitting: Gynecology

## 2013-08-14 ENCOUNTER — Ambulatory Visit (INDEPENDENT_AMBULATORY_CARE_PROVIDER_SITE_OTHER): Payer: 59 | Admitting: Gynecology

## 2013-08-14 VITALS — BP 120/78 | Ht 63.75 in | Wt 153.0 lb

## 2013-08-14 DIAGNOSIS — R5381 Other malaise: Secondary | ICD-10-CM

## 2013-08-14 DIAGNOSIS — R5383 Other fatigue: Secondary | ICD-10-CM

## 2013-08-14 DIAGNOSIS — Z01419 Encounter for gynecological examination (general) (routine) without abnormal findings: Secondary | ICD-10-CM

## 2013-08-14 DIAGNOSIS — E039 Hypothyroidism, unspecified: Secondary | ICD-10-CM

## 2013-08-14 DIAGNOSIS — M6289 Other specified disorders of muscle: Secondary | ICD-10-CM

## 2013-08-14 DIAGNOSIS — R29898 Other symptoms and signs involving the musculoskeletal system: Secondary | ICD-10-CM

## 2013-08-14 LAB — COMPREHENSIVE METABOLIC PANEL
AST: 19 U/L (ref 0–37)
BUN: 10 mg/dL (ref 6–23)
Calcium: 9.4 mg/dL (ref 8.4–10.5)
Chloride: 105 mEq/L (ref 96–112)
Creat: 0.64 mg/dL (ref 0.50–1.10)

## 2013-08-14 LAB — CBC WITH DIFFERENTIAL/PLATELET
Basophils Absolute: 0 10*3/uL (ref 0.0–0.1)
Basophils Relative: 0 % (ref 0–1)
Eosinophils Relative: 1 % (ref 0–5)
HCT: 38.3 % (ref 36.0–46.0)
Lymphocytes Relative: 35 % (ref 12–46)
MCHC: 35.2 g/dL (ref 30.0–36.0)
MCV: 86.5 fL (ref 78.0–100.0)
Monocytes Absolute: 0.4 10*3/uL (ref 0.1–1.0)
RDW: 13.3 % (ref 11.5–15.5)

## 2013-08-14 LAB — LIPID PANEL
Cholesterol: 158 mg/dL (ref 0–200)
HDL: 51 mg/dL (ref >39)
Total CHOL/HDL Ratio: 3.1 Ratio
Triglycerides: 81 mg/dL (ref <150)

## 2013-08-14 LAB — TSH: TSH: 1.319 u[IU]/mL (ref 0.350–4.500)

## 2013-08-14 MED ORDER — LEVOTHYROXINE SODIUM 50 MCG PO TABS: 50.0000 ug | ORAL_TABLET | Freq: Every day | ORAL | Status: DC

## 2013-08-14 NOTE — Progress Notes (Signed)
Shelly Greene 07-Apr-1981 [id number]   History:    32 y.o.  for annual gyn exam who had a Mirena IUD placed in 2010 and has done well. Patient states that she frequently does her self breast examination. Patient has a history of hypothyroidism for which she is on Synthroid 50 mcg daily. Patient was seen by [facility] neurosurgeon who eventually referred the patient to [facility] as a result of a right cerebellar and brainstem lesion as a result of patient's double vision and dizziness.  Patient recently seen in January this year at the [facility] by neurosurgery department and had an MRI in the following was the assessment:  "MAGING STUDIES AND LABS:  Her MRI from 11/29/12 is stable compared to 01/19/12. There remains a contrast enhancing lesion mostly centered in the right middle cerebellar peduncle.  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 Imaging most consistent with benign neoplasm. Biopsy or resection very high risk, so will continue observation for now. If symptoms become worrisome, may consider steroids in future, or resection."  Patient scheduled for followup in January. Her symptoms are essentially the same. She has complain of tiredness and fatigue. No major weight changes reported. Sleeping no problem. Patient would know prior history of abnormal Pap smears.  Past medical history,surgical history, family history and social history were all reviewed and documented in the EPIC chart.  Gynecologic History No LMP recorded. Patient is not currently having periods (Reason: IUD). Contraception: IUD Last Pap: 2012. Results were: normal Last mammogram: none indicated. Results were: none indicated  Obstetric History OB History  Gravida Para Term Preterm AB SAB TAB Ectopic Multiple Living  2  2 2       2     # Outcome Date GA Lbr Len/2nd Weight Sex Delivery Anes PTL Lv  2 TRM           1 TRM                ROS: A ROS was performed and pertinent positives and negatives are included in the history.  GENERAL: No fevers or chills. HEENT: No change in vision, no earache, sore throat or sinus congestion. NECK: No pain or stiffness. CARDIOVASCULAR: No chest pain or pressure. No palpitations. PULMONARY: No shortness of breath, cough or wheeze. GASTROINTESTINAL: No abdominal pain, nausea, vomiting or diarrhea, melena or bright red blood per rectum. GENITOURINARY: No urinary frequency, urgency, hesitancy or dysuria. MUSCULOSKELETAL: No joint or muscle pain, no back pain, no recent trauma. DERMATOLOGIC: No rash, no itching, no lesions. ENDOCRINE: No polyuria, polydipsia, no heat or cold intolerance. No recent change in weight. HEMATOLOGICAL: No anemia or easy bruising or bleeding. NEUROLOGIC: No headache, seizures, numbness, tingling or weakness. PSYCHIATRIC: No depression, no loss of interest in normal activity or change in sleep pattern.     Exam: chaperone present  BP 120/78  Ht 5' 3.75" (1.619 m)  Wt 153 lb (69.4 kg)  BMI 26.48 kg/m2  Body mass index is 26.48 kg/(m^2).  General appearance : Well developed well nourished female. No acute distress HEENT: Neck supple, trachea midline, no carotid bruits, no thyroidmegaly Lungs: Clear to auscultation, no rhonchi or wheezes, or rib retractions  Heart: Regular rate and rhythm, no murmurs or gallops Breast:Examined in sitting and supine position were symmetrical in appearance, no palpable masses or tenderness,  no  skin retraction, no nipple inversion, no nipple discharge, no skin discoloration, no axillary or supraclavicular lymphadenopathy Abdomen: no palpable masses or tenderness, no rebound or guarding Extremities: no edema or skin discoloration or tenderness  See above in the history of present illness for  symptoms  Pelvic:  Bartholin, Urethra, Skene Glands: Within normal limits             Vagina: No gross lesions or discharge  Cervix: No gross lesions or dischargeIUD seen  Uterus  Anteverted, normal size, shape and consistency, non-tender and mobile  Adnexa  Without masses or tenderness  Anus and perineum  normal   Rectovaginal  normal sphincter tone without palpated masses or tenderness             Hemoccult none indicated     Assessment/Plan:  31 y.o. female with history of hypothyroidism. Patient needs MRI changed next year. Patient with a right middle cerebral peduncle lesion being followed by a neurosurgeon at [facility]. Followup MRI January 2015. The following labs ordered today: CBC, TSH, comprehensive metabolic panel, fasting lipid profile urinalysis, and vitamin D. Pap smear not done today the new screening guidelines discussed.  Ok Edwards MD, 11:39 AM 08/14/2013

## 2013-08-14 NOTE — Patient Instructions (Addendum)
Autoexamen de mamas  (Breast Self-Awareness) El autoexamen de mamas puede detectar problemas de manera temprana, prevenir complicaciones mdicas significativas y posiblemente salvar su vida. Al hacerlo, podr familiarizarse con el aspecto y forma de sus mamas, y observar cambios. Esto le permite descubrir cambios de manera precoz. Este autoexamen mensual le ofrece la tranquilidad de que sus senos estn en buen estado de salud. Una forma de aprender qu es normal para sus mamas y si sufren modificaciones es hacer un autoexamen.   Si encuentra un bulto o algo que no estaba presente anteriormente, lo mejor es ponerse en contacto con su mdico inmediatamente. Otro hallazgo que debe ser evaluado por su mdico es la secrecin del pezn, especialmente si es con sangre; cambios en la piel o enrojecimiento; reas donde la piel parece estar tironeada (retrada) o nuevos bultos o protuberancias. El dolor en los senos es rara vez se asocia con el cncer (malignidad), pero tambin debe ser evaluado por un mdico.  CMO REALIZAR EL AUTOEXAMEN DE MAMAS  El mejor momento para examinar sus mamas es a los 5 a 7 das despus de finalizado el perodo menstrual. Durante la menstruacin, las mamas estn ms abultadas y puede haber ms dificultad para encontrar modificaciones. Si no menstra, ha llegado a la menopausia, o le han extirpado el tero (histerectomia), usted debe examinar sus senos a intervalos regulares, por ejemplo cada mes. Si est amamantando, examine sus senos despus de alimentar al beb o despus de usar un extractor de leche. Los implantes mamarios no disminuyen el riesgo de bultos o tumores, por lo que debe seguir realizando el autoexamen de mamas como se recomienda. Hable con su mdico acerca de cmo determinar la diferencia entre el implante y el tejido mamario. Adems, debe consultar cuanta presin debe hacer durante el examen. Con el tiempo se familiarizar con las variaciones de las mamas y se sentir ms  cmoda para realizar el examen. Para el autoexamen deber quitarse toda la ropa de la cintura para arriba.  1.  Observe sus senos y pezones. Prese frente a un espejo en una habitacin con buena iluminacin. Con las manos en las caderas, presione las manos firmemente hacia abajo. Busque diferencias en la forma, el contorno y el tamao de un pecho al otro (asimetras). Entre las asimetras se incluyen arrugas, depresiones o protuberancias. Tambin, busque cambios en la piel, como reas enrojecidas o escamosas. Busque cambios en los pezones, como secreciones, hoyuelos, cambios en la posicin, o enrojecimiento. 2. Palpe cuidadosamente sus senos. Es mucho mejor hacerlo en la ducha o en la baera, mientras usa jabn o cuando est recostada sobre su espalda. Coloque el brazo (en el lado de la mama que se examina) por arriba de la cabeza. Use las yemas (no las puntas) de los tres dedos centrales de la mano opuesta para palpar. Comience en la zona de la axila, haga crculos de  de pulgada (2 cm) y vaya superponindolos. Utilice 3 niveles diferentes de presin (ligero, medio y firme) en cada crculo antes de pasar al siguiente. Se necesita una presin ligera para sentir los tejidos ms cercanos a la piel. La presin media ayudar a sentir el tejido mamario un poco ms profundo, mientras que se necesita una presin firme para palpar el tejido que se encuentra cerca de las costillas. Continuar superponiendo crculos y vaya hacia abajo, hasta sentir las costillas, por debajo del pecho. Luego mueva un espacio del ancho de un dedo hacia el centro del cuerpo. Siga con los crculos del  de pulgada (  2 cm) mientras va lentamente hacia la clavcula, cerca de la base del cuello. Contine con el examen hacia arriba y hacia abajo con las 3 intensidades de presin hasta llegar a la mitad del pecho. Hgalo con cada seno cuidadosamente, buscando bultos o modificaciones. 3. Debe llevar un registro escrito con los cambios o los hallazgos  normales que encuentre para cada seno. Si registra esta informacin, no tiene que depender slo de la memoria para recordar el tamao, la sensibilidad o la ubicacin de los hallazgos. Anote en qu momento se encuentra del ciclo menstrual, si usted todava est menstruando. El tejido mamario puede tener algunos bultos o tejidos engrosados. Sin embargo, consulte a su mdico si usted encuentra algo que le preocupa.   SOLICITE ATENCIN MDICA SI:   Observa cambios en la forma, en el contorno o el tamao de las mamas o los pezones.   Hay modificaciones en la piel, como zonas enrojecidas o escamosas en las mamas o en los pezones.   Tiene una secrecin anormal en los pezones.   Siente un nuevo bulto o reas engrosadas de manera anormal.  Document Released: 11/07/2005 Document Revised: 10/24/2012 Exit[employer] Patient Information 2014 [employer].  

## 2013-08-15 LAB — URINALYSIS W MICROSCOPIC + REFLEX CULTURE
Bilirubin Urine: NEGATIVE
Casts: NONE SEEN
Glucose, UA: NEGATIVE mg/dL
Ketones, ur: NEGATIVE mg/dL
WBC, UA: >50 WBC/hpf — AB (ref <3)
pH: 5.5 (ref 5.0–8.0)

## 2013-08-15 LAB — VITAMIN D 25 HYDROXY (VIT D DEFICIENCY, FRACTURES): Vit D, 25-Hydroxy: 26 ng/mL — ABNORMAL LOW (ref 30–89)

## 2014-01-08 ENCOUNTER — Encounter: Payer: Self-pay | Admitting: Gynecology

## 2014-01-08 ENCOUNTER — Ambulatory Visit (INDEPENDENT_AMBULATORY_CARE_PROVIDER_SITE_OTHER): Payer: 59 | Admitting: Gynecology

## 2014-01-08 VITALS — BP 122/76

## 2014-01-08 DIAGNOSIS — R300 Dysuria: Secondary | ICD-10-CM

## 2014-01-08 DIAGNOSIS — N390 Urinary tract infection, site not specified: Secondary | ICD-10-CM

## 2014-01-08 LAB — URINALYSIS W MICROSCOPIC + REFLEX CULTURE
Bilirubin Urine: NEGATIVE
CRYSTALS: NONE SEEN
Casts: NONE SEEN
Glucose, UA: NEGATIVE mg/dL
KETONES UR: NEGATIVE mg/dL
NITRITE: NEGATIVE
PH: 5.5 (ref 5.0–8.0)
Protein, ur: 100 mg/dL — AB
SPECIFIC GRAVITY, URINE: 1.025 (ref 1.005–1.030)
UROBILINOGEN UA: 0.2 mg/dL (ref 0.0–1.0)

## 2014-01-08 MED ORDER — CIPROFLOXACIN HCL 250 MG PO TABS: 250.0000 mg | ORAL_TABLET | Freq: Two times a day (BID) | ORAL | Status: DC

## 2014-01-08 NOTE — Progress Notes (Signed)
   Patient is a 33 year old who presented to the office today complaining of the past several days with dysuria and frequency and some mild low back pain. Patient denied any fever chills, nausea, or vomiting. Patient denied any vaginal discharge. She has a Mirena IUD which needs to be changed later this year.  Exam: Abdomen: Suprapubic tenderness was noted but the rest of the abdomen was otherwise soft and nontender without rebound or guarding. Back. No CVA tenderness Pelvic exam: Not done  Urinalysis: WBC: To numerous to count RBC: Too numerous to count Bacteria: Many  Assessment/plan: Urinary tract infection. Patient will be prescribed Cipro 250 mg twice a day for 3 days. Patient will be given samples of  Uribell anti-spasmodic agent one by mouth 4 times a day for the next 2 days.

## 2014-01-08 NOTE — Patient Instructions (Signed)
  Infeccin urinaria  (Urinary Tract Infection)  La infeccin urinaria puede ocurrir en cualquier lugar del tracto urinario. El tracto urinario es un sistema de drenaje del cuerpo por el que se eliminan los desechos y el exceso de agua. El tracto urinario est formado por dos riones, dos urteres, la vejiga y la uretra. Los riones son rganos que tienen forma de frijol. Cada rin tiene aproximadamente el tamao del puo. Estn situados debajo de las costillas, uno a cada lado de la columna vertebral CAUSAS  La causa de la infeccin son los microbios, que son organismos microscpicos, que incluyen hongos, virus, y bacterias. Estos organismos son tan pequeos que slo pueden verse a travs del microscopio. Las bacterias son los microorganismos que ms comnmente causan infecciones urinarias.  SNTOMAS  Los sntomas pueden variar segn la edad y el sexo del paciente y por la ubicacin de la infeccin. Los sntomas en las mujeres jvenes incluyen la necesidad frecuente e intensa de orinar y una sensacin dolorosa de ardor en la vejiga o en la uretra durante la miccin. Las mujeres y los hombres mayores podrn sentir cansancio, temblores y debilidad y sentir dolores musculares y dolor abdominal. Si tiene fiebre, puede significar que la infeccin est en los riones. Otros sntomas son dolor en la espalda o en los lados debajo de las costillas, nuseas y vmitos.  DIAGNSTICO  Para diagnosticar una infeccin urinaria, el mdico le preguntar acerca de sus sntomas. Tambin le solicitar una muestra de orina. La muestra de orina se analiza para detectar bacterias y glbulos blancos de la sangre. Los glbulos blancos se forman en el organismo para ayudar a combatir las infecciones.  TRATAMIENTO  Por lo general, las infecciones urinarias pueden tratarse con medicamentos. Debido a que la mayora de las infecciones son causadas por bacterias, por lo general pueden tratarse con antibiticos. La eleccin del  antibitico y la duracin del tratamiento depender de sus sntomas y el tipo de bacteria causante de la infeccin.  INSTRUCCIONES PARA EL CUIDADO EN EL HOGAR   Si le recetaron antibiticos, tmelos exactamente como su mdico le indique. Termine el medicamento aunque se sienta mejor despus de haber tomado slo algunos.  Beba gran cantidad de lquido para mantener la orina de tono claro o color amarillo plido.  Evite la cafena, el t y las bebidas gaseosas. Estas sustancias irritan la vejiga.  Vaciar la vejiga con frecuencia. Evite retener la orina durante largos perodos.  Vace la vejiga antes y despus de tener relaciones sexuales.  Despus de mover el intestino, las mujeres deben higienizarse la regin perineal desde adelante hacia atrs. Use slo un papel tissue por vez. SOLICITE ATENCIN MDICA SI:   Siente dolor en la espalda.  Le sube la fiebre.  Los sntomas no mejoran luego de 3 das. SOLICITE ATENCIN MDICA DE INMEDIATO SI:   Siente dolor intenso en la espalda o en la zona inferior del abdomen.  Comienza a sentir escalofros.  Tiene nuseas o vmitos.  Tiene una sensacin continua de quemazn o molestias al orinar. ASEGRESE DE QUE:   Comprende estas instrucciones.  Controlar su enfermedad.  Solicitar ayuda de inmediato si no mejora o empeora. Document Released: 08/17/2005 Document Revised: 08/01/2012 Exit[employer] Patient Information 2014 [employer].  

## 2014-01-09 LAB — URINE CULTURE: Colony Count: >=100000

## 2014-01-28 ENCOUNTER — Telehealth: Payer: Self-pay | Admitting: Family Medicine

## 2014-01-28 NOTE — Telephone Encounter (Signed)
Pt states was seen by an Urgent Care yesterday.  She has a headache and wants to make sure she can take Ibuprofen, which is something she normally takes for a headache, with the Amoxicillin she was prescribed.  I told patient that should be just fine.  Pt verbalized understanding.  Amaad Byers, Loralyn [place], [place]

## 2014-01-28 NOTE — Telephone Encounter (Signed)
Starting taking antibotic yesterday but is still running fever. Is this normal? Please advise

## 2014-01-29 NOTE — Telephone Encounter (Signed)
Agree with RN plan as documented.

## 2014-02-17 ENCOUNTER — Ambulatory Visit (INDEPENDENT_AMBULATORY_CARE_PROVIDER_SITE_OTHER): Payer: 59 | Admitting: Family Medicine

## 2014-02-17 ENCOUNTER — Encounter: Payer: Self-pay | Admitting: Family Medicine

## 2014-02-17 VITALS — BP 109/73 | HR 75 | Temp 98.1°F | Ht 64.0 in | Wt 160.0 lb

## 2014-02-17 DIAGNOSIS — H9210 Otorrhea, unspecified ear: Secondary | ICD-10-CM

## 2014-02-17 DIAGNOSIS — H9221 Otorrhagia, right ear: Secondary | ICD-10-CM | POA: Insufficient documentation

## 2014-02-17 MED ORDER — OFLOXACIN 0.3 % OT SOLN: 5.0000 [drp] | Freq: Every day | OTIC | Status: DC

## 2014-02-17 NOTE — Progress Notes (Signed)
   Subjective:    Patient ID: Shelly Greene, female    DOB: 04/02/1981, 33 y.o.   MRN: [id number]  HPI: Pt presents to clinic for SDA for right ear bleeding. Pt reports being treated for recent URI with amoxicillin a few weeks ago; reports having had congestion, cough, fever, sinus pressure / drainage, etc, with complete resolution after treatment. Pt noted this morning some "dripping" blood from her right ear that stopped with placing cotton swab in her ear; she had no brisk bleeding or other drainage from her ear. Pt does describe some sensation of poor hearing / "buzzing" in her ears a few days ago, but it resolved spontaneously. She denies ear pain, loss of hearing, further bleeding since it stopped, headache, or left-sided symptoms.  Of note, pt has a brainstem lesion for which she follows at [facility]. She chronic right-sided symptoms and some chronic balance / dizziness issues, none of which have been worse than usual or new. She is due for f/u with [facility] since the beginning of this year but has not followed up with them due to insurance problems.  Review of Systems: As above.     Objective:   Physical Exam BP 109/73  Pulse 75  Temp(Src) 98.1 F (36.7 C) (Oral)  Ht 5\' 4"  (1.626 m)  Wt 160 lb (72.576 kg)  BMI 27.45 kg/m2 Gen: well-appearing adult female in NAD HEENT: [place], AT, posterior oropharynx and nasal mucosae clear, EOMI, PERRLA  TM's intact and clear bilaterally with no hemotympanum, bulging, or drainage  Right exterior auditory canal with small amount of dried / clotted blood, easily removed  No brisk bleeding, no definite cut / abrasion but with clot further back in canal too deep to easily remove  Left exterior auditory canal normal Cardio: RRR, no murmur Pulm: CTAB, no wheezes Ext: warm, well-perfused, no LE edema     Assessment & Plan:

## 2014-02-17 NOTE — Patient Instructions (Signed)
Thank you for coming in, today!  I think you have a small dried / cracked area in your ear that is bleeding. Your eardrum itself is not bleeding and there is no blood behind it.  I want you to use ofloxacin ear drops. This is an antibiotic. Put 5 drops in your right ear, once a day, for 7 days. You can drop white vinegar mixed equally with water into your ear a few times per day. This might sting slightly but it will help keep the ear canal clean and help it heal.  Follow up with Dr. Ree Kida (your regular doctor) as needed. Also make sure you follow up with your neurologists, as well. Please feel free to call with any questions or concerns at any time, at [phone]. --Dr. Venetia Maxon

## 2014-02-17 NOTE — Assessment & Plan Note (Signed)
A: Normal TM's bilaterally and no signs / symptoms to suggest new or recurrent infection. Expect bleeding represents broken skin or abrasion of external canal; no definite history of trauma and pt denies scratching inside ear. No brisk bleeding, regardless. Also no new neurologic symptoms to suggest change in chronic issues around brainstem lesion.  P: Discussed with Dr. Lindell Noe. Rx for ofloxacin otic solution drops for 1 week and suggested otherwise using vinegar mixed 1:1 with water to flush ear canals as needed. Reviewed red flags that would prompt immediate return to clinic. F/u with PCP Dr. Ree Kida as needed; will route this note to him, as well.

## 2014-06-24 ENCOUNTER — Telehealth: Payer: Self-pay | Admitting: *Deleted

## 2014-06-24 NOTE — Telephone Encounter (Signed)
Pt called with questions about lower back discomfort, asking which doctor should she see. Pt has annual scheduled with sept. She will follow up with questions at annual.

## 2014-08-18 ENCOUNTER — Other Ambulatory Visit (HOSPITAL_COMMUNITY)
Admission: RE | Admit: 2014-08-18 | Discharge: 2014-08-18 | Disposition: A | Discharge status: Home / Self Care | Payer: 59 | Source: Ambulatory Visit | Attending: Gynecology | Admitting: Gynecology

## 2014-08-18 ENCOUNTER — Ambulatory Visit (INDEPENDENT_AMBULATORY_CARE_PROVIDER_SITE_OTHER): Payer: 59 | Admitting: Gynecology

## 2014-08-18 ENCOUNTER — Encounter: Payer: Self-pay | Admitting: Gynecology

## 2014-08-18 VITALS — BP 120/70 | Ht 64.0 in | Wt 146.0 lb

## 2014-08-18 DIAGNOSIS — E039 Hypothyroidism, unspecified: Secondary | ICD-10-CM

## 2014-08-18 DIAGNOSIS — Z1151 Encounter for screening for human papillomavirus (HPV): Secondary | ICD-10-CM | POA: Diagnosis present

## 2014-08-18 DIAGNOSIS — B9689 Other specified bacterial agents as the cause of diseases classified elsewhere: Secondary | ICD-10-CM

## 2014-08-18 DIAGNOSIS — Z8639 Personal history of other endocrine, nutritional and metabolic disease: Secondary | ICD-10-CM

## 2014-08-18 DIAGNOSIS — A499 Bacterial infection, unspecified: Secondary | ICD-10-CM

## 2014-08-18 DIAGNOSIS — Z30432 Encounter for removal of intrauterine contraceptive device: Secondary | ICD-10-CM

## 2014-08-18 DIAGNOSIS — G939 Disorder of brain, unspecified: Secondary | ICD-10-CM

## 2014-08-18 DIAGNOSIS — G9389 Other specified disorders of brain: Secondary | ICD-10-CM

## 2014-08-18 DIAGNOSIS — Z01419 Encounter for gynecological examination (general) (routine) without abnormal findings: Secondary | ICD-10-CM | POA: Diagnosis not present

## 2014-08-18 DIAGNOSIS — N760 Acute vaginitis: Secondary | ICD-10-CM

## 2014-08-18 LAB — WET PREP FOR TRICH, YEAST, CLUE
Trich, Wet Prep: NONE SEEN
Yeast Wet Prep HPF POC: NONE SEEN

## 2014-08-18 MED ORDER — TINIDAZOLE 500 MG PO TABS: ORAL_TABLET | ORAL | Status: DC

## 2014-08-18 NOTE — Patient Instructions (Addendum)
Influenza Virus Vaccine injection (Fluarix) Qu es este medicamento? La VACUNA ANTIGRIPAL ayuda a disminuir el riesgo de contraer la influenza, tambin conocida como la gripe. La vacuna solo ayuda a protegerle contra algunas cepas de influenza. Esta vacuna no ayuda a reducir Catering manager de contraer influenza pandmica H1N1. Este medicamento puede ser utilizado para otros usos; si tiene alguna pregunta consulte con su proveedor de atencin mdica o con su farmacutico. MARCAS COMERCIALES DISPONIBLES: Fluarix, Fluzone Qu le debo informar a mi profesional de la salud antes de tomar este medicamento? Necesita saber si usted presenta alguno de los siguientes problemas o situaciones: -trastorno de sangrado como hemofilia -fiebre o infeccin -sndrome de Guillain-Barre u otros problemas neurolgicos -problemas del sistema inmunolgico -infeccin por el virus de la inmunodeficiencia humana (VIH) o SIDA -niveles bajos de plaquetas en la sangre -esclerosis mltiple -una Risk analyst o inusual a las vacunas antigripales, a los huevos, protenas de pollo, al ltex, a la gentamicina, a otros medicamentos, alimentos, colorantes o conservantes -si est embarazada o buscando quedar embarazada -si est amamantando a un beb Cmo debo utilizar este medicamento? Esta vacuna se administra mediante inyeccin por va intramuscular. Lo administra un profesional de KB Home	[place]. Recibir una copia de informacin escrita sobre la vacuna antes de cada vacuna. Asegrese de leer este folleto cada vez cuidadosamente. Este folleto puede cambiar con frecuencia. Hable con su pediatra para informarse acerca del uso de este medicamento en nios. Puede requerir atencin especial. Sobredosis: Pngase en contacto inmediatamente con un centro toxicolgico o una sala de urgencia si usted cree que haya tomado demasiado medicamento. ATENCIN: [employer] es solo para usted. No comparta este medicamento con nadie. Qu sucede  si me olvido de una dosis? No se aplica en este caso. Qu puede interactuar con este medicamento? -quimioterapia o radioterapia -medicamentos que suprimen el sistema inmunolgico, tales como etanercept, anakinra, infliximab y adalimumab -medicamentos que tratan o previenen cogulos sanguneos, como warfarina -fenitona -medicamentos esteroideos, como la prednisona o la cortisona -teofilina -vacunas Puede ser que esta lista no menciona todas las posibles interacciones. Informe a su profesional de KB Home	[place] de [employer] productos a base de hierbas, medicamentos de [place] [place] o suplementos nutritivos que est tomando. Si usted fuma, consume bebidas alcohlicas o si utiliza drogas ilegales, indqueselo tambin a su profesional de KB Home	[place]. Algunas sustancias pueden interactuar con su medicamento. A qu debo estar atento al usar Coca[employer]Cola? Informe a su mdico o a Barrister's clerk de la [employer] todos los efectos secundarios que persistan despus de 3 das. Llame a su proveedor de atencin mdica si se presentan sntomas inusuales dentro de las 6 semanas posteriores a la vacunacin. Es posible que todava pueda contraer la gripe, pero la enfermedad no ser tan fuerte como normalmente. No puede contraer la gripe de esta vacuna. La vacuna antigripal no le protege contra resfros u otras enfermedades que pueden causar [place]. Debe vacunarse cada ao. Qu efectos secundarios puedo tener al [employer] este medicamento? Efectos secundarios que debe informar a su mdico o a Barrister's clerk de la salud tan pronto como sea posible: -reacciones alrgicas como erupcin cutnea, picazn o urticarias, hinchazn de la cara, labios o lengua Efectos secundarios que, por lo general, no requieren atencin mdica (debe informarlos a su mdico o a su profesional de la salud si persisten o si son molestos): -fiebre -dolor de cabeza -molestias y dolores musculares -dolor, sensibilidad, enrojecimiento o Database administrator de la inyeccin -cansancio o debilidad Puede ser que [employer]  no menciona todos los posibles efectos secundarios. Comunquese a su mdico por asesoramiento mdico [employer]. Usted puede informar los efectos secundarios a la FDA por telfono al 1-800-FDA-1088. Dnde debo guardar mi medicina? Esta vacuna se administra solamente en clnicas, farmacias, consultorio mdico u otro consultorio de un profesional de la salud y no Sports coach en su domicilio. ATENCIN: Este folleto es un resumen. Puede ser que no cubra toda la posible informacin. Si usted tiene preguntas acerca de esta medicina, consulte con su mdico, su farmacutico o su profesional de Technical sales engineer.  2015, Elsevier/Gold Standard. (2010-05-11 15:31:40) Vaginosis bacteriana (Bacterial Vaginosis) La vaginosis bacteriana es una infeccin vaginal que perturba el equilibrio normal de las bacterias que se encuentran en la vagina. Es el resultado de un crecimiento excesivo de ciertas bacterias. Esta es la infeccin vaginal ms frecuente en mujeres en edad reproductiva. El tratamiento es importante para prevenir complicaciones, especialmente en mujeres embarazadas, dado que puede causar un parto prematuro. CAUSAS  La vaginosis bacteriana se origina por un aumento de bacterias nocivas que, generalmente, estn presentes en cantidades ms pequeas en la vagina. Varios tipos diferentes de bacterias pueden causar esta afeccin. Sin embargo, la causa de su desarrollo no se comprende totalmente. [place] o comportamientos pueden exponerlo a un mayor riesgo de desarrollar vaginosis bacteriana, entre los que se incluyen:  Tener una nueva pareja sexual o mltiples parejas sexuales.  Las duchas vaginales  El uso del DIU (dispositivo intrauterino) como mtodo anticonceptivo. El contagio no se produce en baos, por ropas de cama, en piscinas o por contacto con objetos. SIGNOS Y SNTOMAS   Algunas mujeres que padecen vaginosis bacteriana no presentan signos ni sntomas. Los sntomas ms comunes son:  Secrecin vaginal de color grisceo.  Secrecin vaginal con olor similar al [employer], especialmente despus de Retail banker.  Picazn o sensacin de ardor en la vagina o la vulva.  Ardor o dolor al [employer]. DIAGNSTICO  Su mdico analizar su historia clnica y le examinar la vagina para detectar signos de vaginosis bacteriana. Puede tomarle Truddie Coco de flujo vaginal. Su mdico examinar esta muestra con un microscopio para controlar las bacterias y clulas anormales. Tambin puede realizarse un anlisis del pH vaginal.  TRATAMIENTO  La vaginosis bacteriana puede tratarse con antibiticos, en forma de comprimidos o de crema vaginal. Puede indicarse una segunda tanda de antibiticos si la afeccin se repite despus del tratamiento.  [place] solo medicamentos de venta libre o recetados, segn las indicaciones del mdico.  Si le han recetado antibiticos, tmelos como se le indic. Asegrese de que finaliza la prescripcin completa aunque se sienta mejor.  No mantenga relaciones sexuales Animator.  Comunique a sus compaeros sexuales que sufre una infeccin vaginal. Deben consultar a su mdico y recibir tratamiento si tienen problemas, como picazn o una erupcin cutnea leve.  Practique el sexo seguro usando preservativos y tenga un nico compaero sexual. SOLICITE ATENCIN MDICA SI:   Sus sntomas no mejoran despus de 3 das de [place].  Aumenta la secrecin o Conservation officer, historic buildings.  Tiene fiebre. ASEGRESE DE QUE:   Comprende estas instrucciones.  Controlar su afeccin.  Recibir ayuda de inmediato si no mejora o si empeora. PARA OBTENER MS INFORMACIN  Centros para el control y la prevencin de Probation officer for Disease Control and Prevention, CDC): AppraiserFraud.fi Asociacin  Estadounidense de la Salud Sexual (American Sexual Health Association, SHA): www.ashastd.org  Document Released:  02/14/2008 Document Revised: 08/28/2013 ExitCare Patient Information 2015 [place], [place]. This information is not intended to replace advice given to you by your health care provider. Make sure you discuss any questions you have with your health care provider.

## 2014-08-18 NOTE — Progress Notes (Addendum)
Shelly Greene 26-Jun-1981 [id number]   History:    33 y.o.  for annual gyn exam requesting that her Mirena IUD be removed. It was placed in 2010. She will use barrier contraception until her husband gets a vasectomy.Patient has a history of hypothyroidism for which she is on Synthroid 50 mcg daily. Patient was seen by [facility] neurosurgeon who eventually referred the patient to [facility] as a result of a right cerebellar and brainstem lesion as a result of patient's double vision and dizziness.  Patient has been seen at the [facility] department of neurosurgery with the following assessments:  Her MRI from 11/29/12 is stable compared to 01/19/12. There remains a contrast enhancing lesion mostly centered in the right middle cerebellar peduncle.  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 Imaging most consistent with benign neoplasm. Biopsy or resection very high risk, so will continue observation for now. If symptoms become worrisome, may consider steroids in future, or resection." She was seen this June and no change in her MRI and she is going to be referred next year to the [place] brain tumor clinic. Patient is due to go the ophthalmologist this year is recommended for annual visual exams.  Patient with no past history of abnormal Pap smear. She has had history vitamin D deficiency last year.   Past medical history,surgical history, family history and social history were all reviewed and documented in the EPIC chart.  Gynecologic History No LMP recorded. Patient is not currently having periods (Reason: IUD). Contraception: condoms Last Pap: 2012. Results were: normal Last mammogram: Not indicated. Results were: Not indicated  Obstetric History OB History    Gravida Para Term Preterm AB SAB TAB Ectopic Multiple Living  2 2 2       2     # Outcome Date GA Lbr Len/2nd Weight Sex Delivery Anes PTL Lv  2 TRM           1 TRM                ROS: A ROS was performed and pertinent positives and negatives are included in the history.  GENERAL: No fevers or chills. HEENT: No change in vision, no earache, sore throat or sinus congestion. NECK: No pain or stiffness. CARDIOVASCULAR: No chest pain or pressure. No palpitations. PULMONARY: No shortness of breath, cough or wheeze. GASTROINTESTINAL: No abdominal pain, nausea, vomiting or diarrhea, melena or bright red blood per rectum. GENITOURINARY: No urinary frequency, urgency, hesitancy or dysuria. MUSCULOSKELETAL: No joint or muscle pain, no back pain, no recent trauma. DERMATOLOGIC: No rash, no itching, no lesions. ENDOCRINE: No polyuria, polydipsia, no heat or cold intolerance. No recent change in weight. HEMATOLOGICAL: No anemia or easy bruising or bleeding. NEUROLOGIC: No headache, seizures, numbness, tingling or weakness. PSYCHIATRIC: No depression, no loss of interest in normal activity or change in sleep pattern.     Exam: chaperone present  BP 120/70  Ht 5\' 4"  (1.626 m)  Wt 146 lb (66.225 kg)  BMI 25.05 kg/m2  Body mass index is 25.05 kg/(m^2).  General appearance : Well developed well nourished female. No acute distress HEENT: Neck supple, trachea midline, no carotid bruits, no thyroidmegaly Lungs: Clear to auscultation, no rhonchi or wheezes, or rib retractions  Heart: Regular rate and rhythm, no murmurs or gallops Breast:Examined in sitting and supine position were  symmetrical in appearance, no palpable masses or tenderness,  no skin retraction, no nipple inversion, no nipple discharge, no skin discoloration, no axillary or supraclavicular lymphadenopathy Abdomen: no palpable masses or tenderness, no rebound or guarding Extremities: no edema or skin discoloration or  tenderness  Pelvic:  Bartholin, Urethra, Skene Glands: Within normal limits             Vagina: No gross lesions or discharge, foul odor  Cervix: No gross lesions or discharge  Uterus  anteverted, normal size, shape and consistency, non-tender and mobile  Adnexa  Without masses or tenderness  Anus and perineum  normal   Rectovaginal  normal sphincter tone without palpated masses or tenderness             Hemoccult not indicated   Wet prep Amine positive, clue cells moderate, and the VBAC few, bacteria too numerous to count  The IUD string was grasped with a [place] clamp retrieved and shown to the patient and discarded  Assessment/Plan:  32 y.o. female for annual exam with clinical evidence of bacterial vaginosis. She will be placed on Tindamax 500 mg tablet she is to take her today and repeat in 24 hours. Patient with history of hypothyroidism we will be checking her TSH today as well for vitamin D level and CBC, comprehensive metabolic panel, fasting lipid profile, urinalysis and Pap smear. . Patient with a right middle cerebral peduncle lesion being followed by a neurosurgeon at [facility] recent MRI interpretation no change from previous findings. We discussed importance of monthly self breast examinations.     Terrance Mass MD, 9:34 AM 08/18/2014

## 2014-08-18 NOTE — Addendum Note (Signed)
Addended by: Nelva Nay on: 08/18/2014 10:10 AM   Modules accepted: Orders

## 2014-08-19 LAB — COMPREHENSIVE METABOLIC PANEL
ALT: 11 U/L (ref 0–35)
AST: 19 U/L (ref 0–37)
Albumin: 4.5 g/dL (ref 3.5–5.2)
Alkaline Phosphatase: 50 U/L (ref 39–117)
BILIRUBIN TOTAL: 0.7 mg/dL (ref 0.2–1.2)
BUN: 12 mg/dL (ref 6–23)
CALCIUM: 9.6 mg/dL (ref 8.4–10.5)
CHLORIDE: 100 meq/L (ref 96–112)
CO2: 30 meq/L (ref 19–32)
CREATININE: 0.84 mg/dL (ref 0.50–1.10)
Glucose, Bld: 86 mg/dL (ref 70–99)
Potassium: 5 mEq/L (ref 3.5–5.3)
Sodium: 138 mEq/L (ref 135–145)
Total Protein: 7.1 g/dL (ref 6.0–8.3)

## 2014-08-19 LAB — CBC WITH DIFFERENTIAL/PLATELET
Basophils Absolute: 0 10*3/uL (ref 0.0–0.1)
Basophils Relative: 0 % (ref 0–1)
EOS ABS: 0.1 10*3/uL (ref 0.0–0.7)
EOS PCT: 1 % (ref 0–5)
HEMATOCRIT: 40.1 % (ref 36.0–46.0)
HEMOGLOBIN: 13.3 g/dL (ref 12.0–15.0)
LYMPHS ABS: 2.1 10*3/uL (ref 0.7–4.0)
Lymphocytes Relative: 30 % (ref 12–46)
MCH: 29.4 pg (ref 26.0–34.0)
MCHC: 33.2 g/dL (ref 30.0–36.0)
MCV: 88.5 fL (ref 78.0–100.0)
MONO ABS: 0.4 10*3/uL (ref 0.1–1.0)
Monocytes Relative: 6 % (ref 3–12)
Neutro Abs: 4.5 10*3/uL (ref 1.7–7.7)
Neutrophils Relative %: 63 % (ref 43–77)
PLATELETS: 196 10*3/uL (ref 150–400)
RBC: 4.53 MIL/uL (ref 3.87–5.11)
RDW: 13.7 % (ref 11.5–15.5)
WBC: 7.1 10*3/uL (ref 4.0–10.5)

## 2014-08-19 LAB — URINALYSIS W MICROSCOPIC + REFLEX CULTURE
BACTERIA UA: NONE SEEN
Bilirubin Urine: NEGATIVE
CASTS: NONE SEEN
CRYSTALS: NONE SEEN
Glucose, UA: NEGATIVE mg/dL
Hgb urine dipstick: NEGATIVE
Ketones, ur: NEGATIVE mg/dL
NITRITE: NEGATIVE
PH: 6 (ref 5.0–8.0)
Protein, ur: NEGATIVE mg/dL
SPECIFIC GRAVITY, URINE: 1.021 (ref 1.005–1.030)
UROBILINOGEN UA: 0.2 mg/dL (ref 0.0–1.0)

## 2014-08-19 LAB — LIPID PANEL
CHOL/HDL RATIO: 2.9 ratio
Cholesterol: 157 mg/dL (ref 0–200)
HDL: 54 mg/dL (ref >39)
LDL Cholesterol: 86 mg/dL (ref 0–99)
Triglycerides: 84 mg/dL (ref <150)
VLDL: 17 mg/dL (ref 0–40)

## 2014-08-19 LAB — VITAMIN D 25 HYDROXY (VIT D DEFICIENCY, FRACTURES): VIT D 25 HYDROXY: 34 ng/mL (ref 30–89)

## 2014-08-19 LAB — TSH: TSH: 2.381 u[IU]/mL (ref 0.350–4.500)

## 2014-08-20 LAB — URINE CULTURE: Colony Count: 35000

## 2014-08-20 LAB — CYTOLOGY - PAP

## 2014-09-11 ENCOUNTER — Ambulatory Visit (INDEPENDENT_AMBULATORY_CARE_PROVIDER_SITE_OTHER): Payer: 59 | Admitting: Gynecology

## 2014-09-11 ENCOUNTER — Encounter: Payer: Self-pay | Admitting: Gynecology

## 2014-09-11 VITALS — BP 124/78

## 2014-09-11 DIAGNOSIS — N912 Amenorrhea, unspecified: Secondary | ICD-10-CM

## 2014-09-11 DIAGNOSIS — R350 Frequency of micturition: Secondary | ICD-10-CM

## 2014-09-11 DIAGNOSIS — L292 Pruritus vulvae: Secondary | ICD-10-CM

## 2014-09-11 DIAGNOSIS — Z113 Encounter for screening for infections with a predominantly sexual mode of transmission: Secondary | ICD-10-CM

## 2014-09-11 LAB — URINALYSIS W MICROSCOPIC + REFLEX CULTURE
BILIRUBIN URINE: NEGATIVE
Glucose, UA: NEGATIVE mg/dL
Hgb urine dipstick: NEGATIVE
KETONES UR: NEGATIVE mg/dL
Leukocytes, UA: NEGATIVE
NITRITE: NEGATIVE
PROTEIN: NEGATIVE mg/dL
SPECIFIC GRAVITY, URINE: 1.025 (ref 1.005–1.030)
UROBILINOGEN UA: 0.2 mg/dL (ref 0.0–1.0)
pH: 7.5 (ref 5.0–8.0)

## 2014-09-11 LAB — WET PREP FOR TRICH, YEAST, CLUE: Trich, Wet Prep: NONE SEEN

## 2014-09-11 LAB — PREGNANCY, URINE: PREG TEST UR: NEGATIVE

## 2014-09-11 MED ORDER — METRONIDAZOLE 500 MG PO TABS: 500.0000 mg | ORAL_TABLET | Freq: Two times a day (BID) | ORAL | Status: DC

## 2014-09-11 NOTE — Progress Notes (Signed)
   Patient presented to the office today stating the past few days she's been having frequency and some vulvar pruritus and a yellowish discharge. She states that she is in a monogamous relationship. Last month we'll remove the Mirena IUD. She has not been using any form of contraception. She was counseled on the importance of taking prenatal vitamin for neural tube defect prophylaxis. Her urine pregnancy test was negative today. Her urinalysis essentially negative today.  Exam: Abdomen: Soft nontender no rebound or guarding Pelvic: Bartholin urethra Skene was within normal limits Vagina: Clear yellow discharge is noted. Cervix: No lesions or discharge GC and Chlamydia culture obtained along with wet prep Uterus: Anteverted normal size shape and consistency Adnexa: No palpable masses or tenderness rectal exam not done  Wet prep:  Amine Pos, moderate clue cells, few WBC, too numerous to count bacteria  Assessment/plan: Clinical evidence of bacterial vaginosis patient will be prescribed Flagyl 500 mg one by mouth twice a day for 7 days. GC and Chlamydia culture pending at time of this dictation.

## 2014-09-11 NOTE — Patient Instructions (Addendum)
Vaginosis bacteriana (Bacterial Vaginosis) La vaginosis bacteriana es una infeccin de la vagina. Se produce cuando una cantidad excesiva de ciertos grmenes (bacterias) crece en la vagina. [place] los medicamentos tal como se lo indic su mdico.  Finalice la prescripcin completa, aunque comience a sentirse mejor.  No mantenga relaciones sexuales hasta que finalice sus medicamentos y se International aid/development worker.  Comunique a sus compaeros sexuales que sufre una infeccin. Deben consultar a su mdico para iniciar un tratamiento.  Practique el sexo seguro. Use preservativos. Tenga solo un compaero sexual. SOLICITE AYUDA SI:  No mejora luego de 3 das de [place].  Observa una secrecin (prdida) de color gris ms abundante que proviene de la vagina.  Siente ms dolor que antes.  Tiene fiebre. ASEGRESE DE QUE:   Comprende estas instrucciones.  Controlar su afeccin.  Recibir ayuda de inmediato si no mejora o si empeora. Document Released: 02/03/2009 Document Revised: 08/28/2013 [facility] Patient Information 2015 [place]. This information is not intended to replace advice given to you by your health care provider. Make sure you discuss any questions you have with your health care provider. Metronidazole tablets or capsules Qu es este medicamento? El METRONIDAZOL es un antiinfeccioso. Se utiliza en el tratamiento de ciertos tipos de infecciones bacterianas y por protozoos. No es efectivo para resfros, gripe u otras infecciones de origen viral. Este medicamento puede ser utilizado para otros usos; si tiene alguna pregunta consulte con su proveedor de atencin mdica o con su farmacutico. MARCAS COMERCIALES DISPONIBLES: Flagyl Qu le debo informar a mi profesional de la salud antes de tomar este medicamento? Necesita saber si usted presenta alguno de los siguientes problemas o situaciones: -anemia u otros trastornos sanguneos -enfermedad del sistema  nervioso -infeccin mictica o por levadura -si consume bebidas alcohlicas -enfermedad heptica -convulsiones -una reaccin alrgica o inusual al metronidazol, a otros medicamentos, alimentos, colorantes o conservantes -si est embarazada o buscando quedar embarazada -si est amamantando a un beb Cmo debo utilizar este medicamento? Tome este medicamento por va oral con un vaso lleno de agua. Siga las instrucciones de la etiqueta del [place]. Tome sus dosis a intervalos regulares. No tome su medicamento con una frecuencia mayor a la indicada. Complete todo el tratamiento con el medicamento como recetado aun si se siente mejor. No omita ninguna dosis o suspenda el uso de su medicamento antes de lo indicado. Hable con su pediatra para informarse acerca del uso de este medicamento en nios. Puede requerir atencin especial. Sobredosis: Pngase en contacto inmediatamente con un centro toxicolgico o una sala de urgencia si usted cree que haya tomado demasiado medicamento. ATENCIN: [employer] es solo para usted. No comparta este medicamento con nadie. Qu sucede si me olvido de una dosis? Si olvida una dosis, tmela lo antes posible. Si es casi la hora de la prxima dosis, tome slo esa dosis. No tome dosis adicionales o dobles. Qu puede interactuar con este medicamento? No tome esta medicina con ninguno de los siguientes medicamentos: -alcohol o cualquier producto que contiene alcohol -solucin oral de amprenavir -cisapride -disulfiram -dofetilida -dronedarona -inyeccin de paclitaxel -pimozida -solucin oral de ritonavir -solucin oral de sertralina -inyeccin de sulfametoxasol-trimetoprima -tioridazina -ziprasidona Esta medicina tambin puede interactuar con los siguientes medicamentos: -pldoras anticonceptivas -cimetidina -litio -otros medicamentos que prolongan el intervalo QT (provoca un ritmo cardiaco anormal) -fenobarbital -fenitona -warfarina Puede ser  que esta lista no menciona todas las posibles interacciones. Informe a su profesional de la salud de [employer] productos a base de  hierbas, medicamentos de venta libre o suplementos nutritivos que est tomando. Si usted fuma, consume bebidas alcohlicas o si utiliza drogas ilegales, indqueselo tambin a su profesional de [employer]	[place]. Algunas sustancias pueden interactuar con su medicamento. A qu debo estar atento al usar Coca-Cola? Consulte a su mdico o a su profesional de la salud si sus sntomas no mejoran o si empeoran. Puede experimentar mareos o somnolencia. No conduzca ni utilice maquinaria ni haga nada que Associate Professor en estado de alerta hasta que sepa cmo le afecta este medicamento. No se siente ni se ponga de pie con rapidez, especialmente si es un paciente de edad avanzada. Esto reduce el riesgo de mareos o [employer]. Evite las bebidas alcohlicas durante el tratamiento con este medicamento y [employer] tres das siguientes. El alcohol puede causarle mareos o hacerlo sentir enfermo o ruborizado. Si est recibiendo tratamiento para una enfermedad de transmisin sexual, no tenga relaciones sexuales hasta que haya completado el [place]. Es posible que su pareja tambin necesite [place]. Qu efectos secundarios puedo tener al [employer] este medicamento? Efectos secundarios que debe informar a su mdico o a Barrister's clerk de la salud tan pronto como sea posible: -Chief of Staff como erupcin cutnea o urticarias, hinchazn de la cara, labios o lengua -confusin, torpeza -dificultad para hablar -mareos -decoloracin o dolor de la boca -fiebre, infeccin -entumecimiento, hormigueo, dolor o debilidad en las manos o los pies -dificultad para orinar o cambios en el volumen de orina -enrojecimiento, formacin de ampollas, descamacin o distensin de la piel, inclusive dentro de la boca -convulsiones -cansancio o debilidad inusual -irritacin, resequedad o flujo de la  vagina Efectos secundarios que, por lo general, no requieren atencin mdica (debe informarlos a su mdico o a su profesional de la salud si persisten o si son molestos): -diarrea -dolor de cabeza -irritabilidad -sabor metlico -nuseas -calambres o dolores estomacales -dificultad para conciliar el sueo Puede ser que esta lista no menciona todos los posibles efectos secundarios. Comunquese a su mdico por asesoramiento mdico [employer]. Usted puede informar los efectos secundarios a la FDA por telfono al 1-800-FDA-1088. Dnde debo guardar mi medicina? Mantngala fuera del alcance de los nios. [place]dela a [employer], menos de 25 grados C (77 grados F). Protjala de la luz. Mantenga el envase bien cerrado. Deseche todo el medicamento que no haya utilizado, despus de la fecha de vencimiento. ATENCIN: Este folleto es un resumen. Puede ser que no cubra toda la posible informacin. Si usted tiene preguntas acerca de esta medicina, consulte con su mdico, su farmacutico o su profesional de Technical sales engineer.  2015, Elsevier/Gold Standard. (2013-09-09 17:54:08)

## 2014-09-12 LAB — GC/CHLAMYDIA PROBE AMP
CT PROBE, AMP APTIMA: NEGATIVE
GC Probe RNA: NEGATIVE

## 2014-09-22 ENCOUNTER — Encounter: Payer: Self-pay | Admitting: Gynecology

## 2014-12-14 ENCOUNTER — Other Ambulatory Visit: Payer: Self-pay | Admitting: Gynecology

## 2014-12-25 ENCOUNTER — Telehealth: Payer: Self-pay | Admitting: *Deleted

## 2014-12-25 NOTE — Telephone Encounter (Signed)
Pt informed with the below note. 

## 2014-12-25 NOTE — Telephone Encounter (Signed)
Pt had positive UPT test this am asked if okay to continue taking synthroid 50 mcg? Pt aware to schedule OV. Please advise

## 2014-12-25 NOTE — Telephone Encounter (Signed)
Yes

## 2015-01-05 ENCOUNTER — Encounter: Payer: Self-pay | Admitting: Gynecology

## 2015-01-05 ENCOUNTER — Ambulatory Visit (INDEPENDENT_AMBULATORY_CARE_PROVIDER_SITE_OTHER): Payer: 59 | Admitting: Gynecology

## 2015-01-05 VITALS — BP 126/78

## 2015-01-05 DIAGNOSIS — T7589XA Other specified effects of external causes, initial encounter: Secondary | ICD-10-CM

## 2015-01-05 DIAGNOSIS — G939 Disorder of brain, unspecified: Secondary | ICD-10-CM

## 2015-01-05 DIAGNOSIS — N912 Amenorrhea, unspecified: Secondary | ICD-10-CM

## 2015-01-05 DIAGNOSIS — Z331 Pregnant state, incidental: Secondary | ICD-10-CM

## 2015-01-05 DIAGNOSIS — Z3490 Encounter for supervision of normal pregnancy, unspecified, unspecified trimester: Secondary | ICD-10-CM | POA: Insufficient documentation

## 2015-01-05 DIAGNOSIS — E038 Other specified hypothyroidism: Secondary | ICD-10-CM

## 2015-01-05 LAB — PREGNANCY, URINE: Preg Test, Ur: POSITIVE

## 2015-01-05 NOTE — Progress Notes (Addendum)
   Patient is a 34 year old gravida 3 para 2 who presented to the office today as a result of a positive home urine pregnancy test. Patient's having some mild nausea but no vomiting. She states that she had her Mirena IUD removed in September but did not have it replaced. She had no cycle in October. In November 11 she bled for 8 days. She did not have any cycle in December but blood for 2 days in January. Her 2  previous children were delivered vaginally. She had gestational diabetes with her second pregnancy. 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 Last MRI 11/29/12 is stable compared to 01/19/12. There remains a contrast enhancing lesion mostly centered in the right middle cerebellar peduncle. 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 Imaging most consistent with benign neoplasm. Biopsy or resection very high risk, so will continue observation for now. If symptoms become worrisome, may consider steroids in future, or resection."  MRI scan May 2015 They had reported that the right brain stem cerebellar peduncle lesion was stable with no change from previous scan of 2013. They had reported that because of the location of this lesion it was not recommended to proceed with resection or other treatments at the time. They did recommend follow-up MRI in one year.  Except for some mild nausea patient is doing well.  Exam: Abdomen: Soft nontender no rebound or guarding Pelvic: Bartholin urethra Skene was within normal limits Vagina: No lesions or  discharge Cervix: No lesions or discharge Uterus 8-10 weeks size nontender Adnexa: No palpable masses or tenderness Rectal exam: Not done  Urine pregnancy test positive in office today  Assessment/plan: #1 first trimester pregnancy dates uncertain based on exam 8-10 weeks. Quantitative beta-hCG today we'll repeat at the end of the week and follow-up ultrasound next week to pending on these results. Patient has started prenatal vitamins. 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 She was counseled to stay away from cat litter since she is pregnant.

## 2015-01-05 NOTE — Patient Instructions (Signed)
toxoplasmEmbarazo y toxoplasmosis (Pregnancy and Toxoplasmosis) La toxoplasmosis es una infeccin causada por un parsito. En general, el organismo lucha contra la infeccin sin que usted tenga sntomas. Si se contagia toxoplasmosis durante el embarazo, existe la posibilidad de que la infeccin llegue al beb. Si esto ocurre, el beb podra tener problemas mdicos graves, como ceguera. Algunos de estos problemas podran tardar aos en aparecer. CMO SE CONTAGIA LA TOXOPLASMOSIS? Puede contagiarse toxoplasmosis si:  Toca un objeto contaminado con heces de gato y luego se toca la boca.  Come carne poco cocida de un animal infectado.  Come frutas y vegetales plantados en suelo contaminado. Si usted se infecta durante el embarazo o poco antes del [place], el beb puede contagiarse toxoplasmosis a travs del suministro de [place]. CMO ME PUEDO 1 A M Y A MI BEB CONTRA LA TOXOPLASMOSIS?   No adopte un nuevo gato.  No toque gatos callejeros.  Si tiene un arenero, cbralo cuando no est en uso.  Evite trabajar con suelos donde los gatos podran Landscape architect.  Utilice guantes cuando trabaje con el suelo. Una vez que termine, lvese las manos con agua y [place].  Si tiene un gato:  Solicite que otra persona limpie la bandeja sanitaria del [place]. Esta persona debe lavarse las manos al terminar.  No permita que su gato salga de la casa.  No alimente a su gato con carne cruda.  No coma carne poco cocida, especialmente si nunca ha sido congelada.  Lave y pele todas las frutas y verduras antes de consumirlas.  Si se contagia toxoplasmosis y no est embarazada, espere al menos seis meses antes de [place]. CMO PUEDO SABER SI TENGO TOXOPLASMOSIS? La nica forma de estar segura es con un anlisis. Las personas con toxoplasmosis no siempre tienen sntomas. Si se presentan sntomas, estos pueden ser:  Cristy Hilts.  Ganglios inflamados.  Dolores musculares.  Dolores de  [place]. Puede sentirse resfriada o engripada. Si cree que tiene toxoplasmosis o que puede haber estado expuesta, comunquese con su mdico. CMO SE DIAGNOSTICA LA TOXOPLASMOSIS? Al Maurilio Lovely, su mdico puede solicitarle un anlisis de sangre para verificar si alguna vez tuvo toxoplasmosis. Si ha tenido toxoplasmosis en algn momento, ya no podr contagiarse. Si nunca ha tenido toxoplasmosis, su mdico podra repetir el anlisis ms adelante. Si se contagia durante el [place], su mdico puede hacer otros estudios para saber si la infeccin lleg al beb. Estos estudios pueden ser Ardelia Mems amniocentesis y Earl Lagos. CUL ES EL TRATAMIENTO PARA LA TOXOPLASMOSIS? La toxoplasmosis puede tratarse con antibiticos y otros medicamentos. Algunos de estos medicamentos pueden disminuir la posibilidad de que el beb tenga complicaciones en el futuro. Es posible que deba tomar los medicamentos durante un ao.  QU DEBO HACER EN EL HOGAR SI ME DIAGNOSTICAN TOXOPLASMOSIS?  Tome los medicamentos solamente como se lo haya indicado el [place] los antibiticos segn las indicaciones. Finalice la prescripcin completa, aunque se sienta mejor.  Concurra a todas las visitas de control. Document Released: 08/17/2005 Document Revised: 03/24/2014 [facility] Patient Information 2015 [place]. This information is not intended to replace advice given to you by your health care provider. Make sure you discuss any questions you have with your health care provider. Primer trimestre de Media planner (First Trimester of Pregnancy) El primer trimestre de Media planner se extiende desde la semana1 hasta el final de la semana12 (mes1 al mes3). Una semana despus de que un espermatozoide fecunda un vulo, este se implantar en la pared uterina. Este embrin comenzar a desarrollarse  hasta convertirse en un beb. Sus genes y los de su pareja forman el beb. Los genes del varn determinan si ser un nio o una nia.  Entre la semana6 y [place], se forman los ojos y [place], y los latidos del corazn pueden verse en la ecografa. Al final de las 12semanas, todos los rganos del beb estn formados.  Ahora que est embarazada, querr hacer todo lo que est a su alcance para tener un beb sano. Dos de las cosas ms importantes son Lucilla Edin buena atencin prenatal y seguir las indicaciones del mdico. La atencin prenatal incluye toda la asistencia mdica que usted recibe antes del nacimiento del beb. Esta ayudar a prevenir, Hydrographic surveyor y tratar cualquier problema durante el embarazo y [place]. CAMBIOS EN EL ORGANISMO Su organismo atraviesa por muchos cambios durante el [place], y estos varan de Ardelia Mems mujer a Theatre manager.   Al principio, puede aumentar o bajar algunos kilos.  Puede tener Higher education careers adviser (nuseas) y vomitar. Si no puede controlar los vmitos, llame al mdico.  Puede cansarse con facilidad.  Es posible que tenga dolores de cabeza que pueden aliviarse con los medicamentos que el mdico le permita tomar.  Puede orinar con mayor frecuencia. El dolor al orinar puede significar que usted tiene una infeccin de la vejiga.  Debido al Glennis Brink, puede tener acidez estomacal.  Puede estar estreida, ya que ciertas hormonas enlentecen los movimientos de los msculos que [employer] desechos a travs de los intestinos.  Pueden aparecer hemorroides o abultarse e hincharse las venas (venas varicosas).  Las [employer] Corporation pueden empezar a Engineer, site y Scientist, forensic. Los pezones pueden sobresalir ms, y el tejido que los rodea (areola) tornarse ms oscuro.  Las Production manager y estar sensibles al cepillado y al hilo dental.  Pueden aparecer zonas oscuras o manchas (cloasma, mscara del Media planner) en el rostro que probablemente se atenuarn despus del nacimiento del beb.  Los perodos menstruales se interrumpirn.  Tal vez no tenga apetito.  Puede sentir un fuerte deseo de consumir ciertos  alimentos.  Puede tener cambios a Engineer, site a da, por ejemplo, por momentos puede estar emocionada por el Media planner y por otros preocuparse porque algo pueda salir mal con el embarazo o el beb.  Tendr sueos ms vvidos y extraos.  Tal vez haya cambios en el cabello que pueden incluir su engrosamiento, crecimiento rpido y cambios en la textura. A algunas mujeres tambin se les cae el cabello durante o despus del [place], o tienen el cabello seco o fino. Lo ms probable es que el cabello se le normalice despus del nacimiento del beb. QU DEBE ESPERAR EN LAS CONSULTAS PRENATALES Durante una visita prenatal de rutina:  La pesarn para asegurarse de que usted y el beb estn creciendo normalmente.  Le controlarn la presin arterial.  Le medirn el abdomen para controlar el desarrollo del beb.  Se escucharn los latidos cardacos a partir de la semana10 o la12 de embarazo, aproximadamente.  Se analizarn los resultados de los estudios solicitados en visitas anteriores. El mdico puede preguntarle:  Cmo se siente.  Si siente los movimientos del beb.  Si ha tenido sntomas anormales, como prdida de lquido, [place], dolores de cabeza intensos o clicos abdominales.  Si tiene [place]. Otros estudios que pueden realizarse durante el primer trimestre incluyen lo siguiente:  Anlisis de sangre para determinar el tipo de sangre y Hydrographic surveyor la presencia de infecciones previas. Adems, se los usar para controlar si los niveles de hierro son  bajos (anemia) y determinar los anticuerpos Rh. En una etapa ms avanzada del [place], se harn anlisis de sangre para saber si tiene diabetes, junto con otros estudios si surgen problemas.  Anlisis de orina para detectar infecciones, diabetes o protenas en la orina.  Una ecografa para confirmar que el beb crece y se desarrolla correctamente.  Una amniocentesis para diagnosticar posibles problemas  genticos.  Estudios del feto para descartar espina bfida y sndrome de [place].  Es posible que necesite otras pruebas adicionales. INSTRUCCIONES PARA EL CUIDADO EN EL HOGAR  Medicamentos:  Siga las indicaciones del mdico en relacin con el uso de medicamentos. Durante el embarazo, hay medicamentos que pueden tomarse y 34 que no.  Tome las vitaminas prenatales como se le indic.  Si est estreida, tome un laxante suave, si el mdico lo [place]. Dieta  Consuma alimentos balanceados. Elija alimentos variados, como carne o protenas de origen vegetal, pescado, leche y productos lcteos descremados, verduras, frutas y panes y Psychologist, prison and probation services. El mdico la ayudar a Office manager cantidad de peso que puede [place].  No coma carne cruda ni quesos sin cocinar. Estos elementos contienen bacterias que pueden causar defectos congnitos en el beb.  La ingesta diaria de cuatro o cinco comidas pequeas en lugar de tres comidas abundantes puede ayudar a K[employer] nuseas y los vmitos. Si empieza a tener nuseas, comer algunas galletas saladas puede ser de [place]. Beber lquidos Lehman Brothers comidas en lugar de tomarlos durante las comidas tambin puede ayudar a Actor las nuseas y los vmitos.  Si est estreida, consuma alimentos con alto contenido de fibra, como verduras y frutas frescas, y Psychologist, prison and probation services. Beba suficiente lquido para Consulting civil engineer orina clara o de color amarillo plido. Actividad y Conservation officer, historic buildings ejercicio solamente como se lo haya indicado el mdico. El ejercicio la ayudar a:  Technical sales engineer.  Mantenerse en forma.  Estar preparada para el trabajo de parto y [place].  Los dolores, los clicos en la parte baja del abdomen o los calambres en la cintura son un buen indicio de que debe dejar de Insurance risk surveyor. Consulte al mdico antes de seguir haciendo ejercicios normales.  Intente no estar de pie Tech Data Corporation. Mueva las piernas con frecuencia si  debe estar de pie en un lugar durante mucho tiempo.  Evite levantar pesos [employer].  Use zapatos de tacones bajos y [place].  Puede seguir teniendo [employer]ot, excepto que el mdico le indique lo contrario. Alivio del dolor o las molestias  Use un sostn que le brinde buen soporte si siente dolor a la palpacin Sempra Energy.  Dese baos de asiento con agua tibia para Best boy o las molestias causadas por las hemorroides. Use crema antihemorroidal si el mdico se lo permite.  Descanse con las piernas elevadas si tiene calambres o dolor de cintura.  Si tiene venas varicosas en las piernas, use medias de descanso. Eleve los pies durante 37minutos, 3 o 4veces por da. Limite la cantidad de sal en su dieta. Cuidados prenatales  Programe las visitas prenatales para la semana12 de [place]. Generalmente se programan cada mes al principio y se hacen ms frecuentes en los 2 ltimos meses antes del parto.  Escriba sus preguntas. Llvelas cuando concurra a las visitas prenatales.  Concurra a todas las visitas prenatales como se lo haya indicado el mdico. Seguridad  Colquese el cinturn de seguridad cuando conduzca.  Haga una lista de los nmeros de telfono de Freight forwarder, que  incluya los nmeros de telfono de familiares, amigos, el hospital y los departamentos de polica y bomberos. Consejos generales  Pdale al mdico que la derive a clases de educacin prenatal en su localidad. Debe comenzar a tomar las clases antes de Dietitian en el mes6 de embarazo.  Pida ayuda si tiene necesidades nutricionales o de asesoramiento Solicitor. El mdico puede aconsejarla o derivarla a especialistas para que la ayuden con diferentes necesidades.  No se d baos de inmersin en agua caliente, baos turcos ni saunas.  No se haga duchas vaginales ni use tampones o toallas higinicas perfumadas.  No mantenga las piernas cruzadas durante mucho  tiempo.  Evite el contacto con las bandejas sanitarias de los gatos y la tierra que estos animales usan. Estos elementos contienen bacterias que pueden causar defectos congnitos al beb y la posible prdida del feto debido a un aborto espontneo o muerte fetal.  No fume, no consuma hierbas ni medicamentos que no hayan sido recetados por el mdico. Las sustancias qumicas que estos productos contienen afectan la formacin y el desarrollo del beb.  Programe una cita con el dentista. En su casa, lvese los dientes con un cepillo dental blando y psese el hilo dental con suavidad. SOLICITE ATENCIN MDICA SI:   Tiene mareos.  Siente clicos leves, presin en la pelvis o dolor persistente en el abdomen.  Tiene nuseas, vmitos o diarrea persistentes.  Tiene secrecin vaginal con mal olor.  Siente dolor al Continental Airlines.  Tiene el rostro, las [employer], las piernas o los tobillos ms hinchados. SOLICITE ATENCIN MDICA DE INMEDIATO SI:   Tiene fiebre.  Tiene una prdida de lquido por la vagina.  Tiene sangrado o pequeas prdidas vaginales.  Siente dolor intenso o clicos en el abdomen.  Sube o baja de peso rpidamente.  Vomita sangre de color rojo brillante o material que parezca granos de caf.  Ha estado expuesta a la rubola y no ha sufrido la enfermedad.  Ha estado expuesta a la quinta enfermedad o a la varicela.  Tiene un dolor de cabeza intenso.  Le falta el aire.  Sufre cualquier tipo de traumatismo, por ejemplo, debido a una cada o un accidente automovilstico. Document Released: 08/17/2005 Document Revised: 03/24/2014 [facility] Patient Information 2015 [place], [place]. This information is not intended to replace advice given to you by your health care provider. Make sure you discuss any questions you have with your health care provider.

## 2015-01-06 LAB — HCG, QUANTITATIVE, PREGNANCY: hCG, Beta Chain, Quant, S: 17748.8 m[IU]/mL

## 2015-01-07 LAB — TOXOPLASMA GONDII ANTIBODY, IGM: Toxoplasma Antibody- IgM: <3 IU/mL (ref <8.0)

## 2015-01-08 ENCOUNTER — Other Ambulatory Visit: Payer: 59

## 2015-01-08 DIAGNOSIS — Z3490 Encounter for supervision of normal pregnancy, unspecified, unspecified trimester: Secondary | ICD-10-CM

## 2015-01-09 LAB — HCG, QUANTITATIVE, PREGNANCY: hCG, Beta Chain, Quant, S: 21824.2 m[IU]/mL

## 2015-01-12 ENCOUNTER — Other Ambulatory Visit: Payer: 59

## 2015-01-12 ENCOUNTER — Telehealth: Payer: Self-pay | Admitting: *Deleted

## 2015-01-12 ENCOUNTER — Ambulatory Visit: Payer: 59 | Admitting: Gynecology

## 2015-01-12 ENCOUNTER — Telehealth: Payer: Self-pay | Admitting: Gynecology

## 2015-01-12 NOTE — Telephone Encounter (Signed)
FYI Pt is about 8-10 weeks pregnancy, has ultrasound scheduled on 01/14/15. Noticed some brownish discharge when wiping last night and this am. No bright red blood or bleeding, no pain. Pt said she feels fine, has not noticed any more brownish discharge.  I told pt not abnormal to have brownish discharge to call if pain or if any bleeding should occur. If no further directions pt will see you on Wednesday for ultrasound.

## 2015-01-12 NOTE — Telephone Encounter (Signed)
Sounds good thank you

## 2015-01-12 NOTE — Telephone Encounter (Signed)
On Call Note 9:20 PM : Two episodes of dark brown spotting yesterday and again today.  No pain, cramping, pressure or other symptoms.  Has ultrasound already scheduled in 2 days but is concerned about waiting.  Recommended patient call the office tomorrow AM to be seen.  ASAP call precautions reviewed to be seen tonight in ER.

## 2015-01-13 ENCOUNTER — Encounter: Payer: Self-pay | Admitting: Gynecology

## 2015-01-13 ENCOUNTER — Ambulatory Visit (INDEPENDENT_AMBULATORY_CARE_PROVIDER_SITE_OTHER): Payer: 59

## 2015-01-13 ENCOUNTER — Ambulatory Visit (INDEPENDENT_AMBULATORY_CARE_PROVIDER_SITE_OTHER): Payer: 59 | Admitting: Gynecology

## 2015-01-13 ENCOUNTER — Other Ambulatory Visit: Payer: Self-pay | Admitting: Gynecology

## 2015-01-13 VITALS — BP 112/70

## 2015-01-13 DIAGNOSIS — O468X1 Other antepartum hemorrhage, first trimester: Secondary | ICD-10-CM

## 2015-01-13 DIAGNOSIS — O200 Threatened abortion: Secondary | ICD-10-CM

## 2015-01-13 DIAGNOSIS — O418X10 Other specified disorders of amniotic fluid and membranes, first trimester, not applicable or unspecified: Secondary | ICD-10-CM

## 2015-01-13 DIAGNOSIS — O289 Unspecified abnormal findings on antenatal screening of mother: Secondary | ICD-10-CM

## 2015-01-13 DIAGNOSIS — D252 Subserosal leiomyoma of uterus: Secondary | ICD-10-CM

## 2015-01-13 DIAGNOSIS — O43891 Other placental disorders, first trimester: Secondary | ICD-10-CM

## 2015-01-13 NOTE — Patient Instructions (Signed)
Amenaza de aborto (Threatened Miscarriage) La amenaza de aborto se produce cuando hay hemorragia vaginal durante las primeras 20semanas de [place], pero el embarazo no se interrumpe. Si durante este perodo usted tiene hemorragia vaginal, el mdico le har pruebas para asegurarse de que el embarazo contine. Si las pruebas muestran que usted contina embarazada y que el "beb" en desarrollo (feto) dentro del tero sigue creciendo, se considera que tuvo una [place] de aborto. La amenaza de aborto no implica que el embarazo vaya a Manufacturing engineer, pero s aumenta el riesgo de perder el embarazo (aborto completo). CAUSAS  Por lo general, no se conoce la causa de la amenaza de aborto. Si el resultado final es el aborto completo, la causa ms frecuente es la cantidad anormal de cromosomas del feto. Los cromosomas son las estructuras internas de las clulas que contienen todo Agricultural engineer gentico. Clinical research associate de las causas de hemorragia vaginal que no ocasionan un aborto incluyen:  [place].  [place].  Los cambios hormonales normales durante el [place].  La hemorragia que se produce cuando el vulo se implanta en el tero. FACTORES DE RIESGO Los factores de riesgo de hemorragia al principio del embarazo incluyen:  Obesidad.  Fumar.  El consumo de cantidades excesivas de alcohol o cafena.  El consumo de drogas. SIGNOS Y SNTOMAS  Hemorragia vaginal leve.  Dolor o clicos abdominales leves. DIAGNSTICO  Si tiene hemorragia con o sin dolor abdominal antes de las 20semanas de [place], el mdico le har pruebas para determinar si el embarazo contina. Una prueba importante incluye el uso de ondas sonoras y de una computadora (ecografa) para crear imgenes del interior del tero. Otras pruebas incluyen el examen interno de la vagina y el tero (examen plvico), y el control de la frecuencia cardaca del feto.  Es posible que le diagnostiquen una amenaza de aborto en los  siguientes casos:  La ecografa muestra que el embarazo contina.  La frecuencia cardaca del feto es alta.  El examen plvico muestra que la apertura entre el tero y la vagina (cuello del tero) est cerrada.  Su frecuencia cardaca y su presin arterial estn estables.  Los [employer] Worldwide de sangre confirman que el embarazo contina. TRATAMIENTO  No se ha demostrado que ningn tratamiento evite que una amenaza de aborto se [place] en un aborto completo. Sin embargo, los cuidados KeyCorp hogar son importantes.  INSTRUCCIONES PARA EL CUIDADO EN EL HOGAR   Asegrese de asistir a todas las citas de cuidados prenatales. Esto es PepsiCo.  Descanse lo suficiente.  No tenga relaciones sexuales ni use tampones si tiene hemorragia vaginal.  No se haga duchas vaginales.  No fume ni consuma drogas.  No beba alcohol.  Evite la cafena. SOLICITE ATENCIN MDICA SI:  Tiene una ligera hemorragia o manchado vaginal durante el embarazo.  Tiene dolor o clicos en el abdomen.  Tiene fiebre. SOLICITE ATENCIN MDICA DE INMEDIATO SI:  Tiene una hemorragia vaginal abundante.  Elimina cogulos de sangre por la vagina.  Siente dolor en la parte baja de la espalda o clicos abdominales intensos.  Tiene fiebre, escalofros y dolor abdominal intenso. ASEGRESE DE QUE:  Comprende estas instrucciones.  Controlar su afeccin.  Recibir ayuda de inmediato si no mejora o si empeora. Document Released: 08/17/2005 Document Revised: 11/12/2013 [facility] Patient Information 2015 [place]. This information is not intended to replace advice given to you by your health care provider. Make sure you discuss any questions you have with your health care provider.

## 2015-01-13 NOTE — Progress Notes (Signed)
   Patient is a 34 year old gravida 3 now para 2 who was seen in the office on February 15 as a result of a positive urine pregnancy test. She is here today with a complaint of dark brown discharge for the past few days intermittently changing to bright red. She also was having some mild lower abdominal cramping. She denied any dysuria or any unusual frequency. She denied any back pain. She denied any fever, chills, nausea or vomiting.  Patient reports her last menstrual period as being November 14 which would place her today at approximately 14 weeks into her pregnancy. 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 We did do a toxoplasmosis screen which was negative. 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 Please refer to pass office note for detail.  Patient has had serial quantitative beta-hCGs as follows: Results for SARAIA, PLATNER (MRN [id number]) as of 01/13/2015 10:49  Ref. Range 01/05/2015 14:53 01/08/2015 08:38  hCG, Beta Chain, Quant, S Latest Units: mIU/mL 17748.8 21824.2   Exam today: Blood pressure 112/70 Gen. appearance: Patient was complaining of brownish discharge no acute distress Abdomen: Soft nontender no rebound or guarding Back: No CVA tenderness Pelvic exam: Bartholin urethra Skene was within normal limits Vagina: Brown discharge noted Cervix: Brown discharge no active bleeding cervical os closed Uterus: 6-8 weeks size nontender Adnexa: No palpable masses or tenderness Rectal exam: Not done  Ultrasound: Intrauterine gestational sac was seen with a crown-rump length of 2.6 mm consistent with 5 weeks and 5 days. No cardiac activity was seen. Gestational sac was consistent with 6 weeks and 6 days. Fetal pole was noted. A small right corpus luteum cyst measuring 19 x 14 mm was noted. Left ovary was normal. Cervix is long closed. There was evidence of a subchorionic hematoma  seen distal to the cervix measuring 20 x 11 mm. Patient did have a right subserous fibroid measuring 19 x 17 mm and a second one measuring 34 x 24 mm.  Assessment/plan: First trimester pregnancy. Size less than dates. My ultrasound patient currently 6 weeks and 2 days with her corrected date of confinement 09/06/2015. Pregnancy with evidence of a subchorionic hematoma. Patient will be followed with threatened abortion precautions. She will need to stay off of work and off her feet and resting for 2 weeks. She is to refrain from intercourse for 2 weeks. Patient will return back to the office in 2 weeks for follow-up ultrasound. Instructions were provided in Spanish.

## 2015-01-14 ENCOUNTER — Ambulatory Visit: Payer: 59 | Admitting: Gynecology

## 2015-01-14 ENCOUNTER — Other Ambulatory Visit: Payer: 59

## 2015-01-16 ENCOUNTER — Telehealth: Payer: Self-pay

## 2015-01-16 NOTE — Telephone Encounter (Signed)
Patient called asking if okay for her to drive. I read her Dr. Durenda Guthrie recommendations at her last visit "She will need to stay off of work and off her feet and resting for 2 weeks. She is to refrain from intercourse for 2 weeks."  I told her the  problem with driving is means she is going somewhere to do something and she is not off her feet and resting like Dr. Moshe Salisbury recommended.  She agreed and said she would be driving to her job just to show someone what she does. She said she did not think she would do that seeing how doing what Dr. Moshe Salisbury had recommended she has been doing so well and all bleeding has stopped. I encouraged her to continue resting and no driving and if she needs letter for work to let us know.

## 2015-01-28 ENCOUNTER — Encounter: Payer: Self-pay | Admitting: Gynecology

## 2015-01-28 ENCOUNTER — Ambulatory Visit (INDEPENDENT_AMBULATORY_CARE_PROVIDER_SITE_OTHER): Payer: 59

## 2015-01-28 ENCOUNTER — Telehealth: Payer: Self-pay

## 2015-01-28 ENCOUNTER — Other Ambulatory Visit: Payer: Self-pay | Admitting: Gynecology

## 2015-01-28 ENCOUNTER — Ambulatory Visit (INDEPENDENT_AMBULATORY_CARE_PROVIDER_SITE_OTHER): Payer: 59 | Admitting: Gynecology

## 2015-01-28 DIAGNOSIS — O039 Complete or unspecified spontaneous abortion without complication: Secondary | ICD-10-CM

## 2015-01-28 DIAGNOSIS — O034 Incomplete spontaneous abortion without complication: Secondary | ICD-10-CM | POA: Insufficient documentation

## 2015-01-28 DIAGNOSIS — O021 Missed abortion: Secondary | ICD-10-CM | POA: Diagnosis not present

## 2015-01-28 DIAGNOSIS — G939 Disorder of brain, unspecified: Secondary | ICD-10-CM | POA: Diagnosis not present

## 2015-01-28 DIAGNOSIS — O200 Threatened abortion: Secondary | ICD-10-CM | POA: Diagnosis not present

## 2015-01-28 DIAGNOSIS — E038 Other specified hypothyroidism: Secondary | ICD-10-CM | POA: Diagnosis not present

## 2015-01-28 DIAGNOSIS — O43891 Other placental disorders, first trimester: Secondary | ICD-10-CM

## 2015-01-28 DIAGNOSIS — Z331 Pregnant state, incidental: Secondary | ICD-10-CM | POA: Diagnosis not present

## 2015-01-28 DIAGNOSIS — Z3490 Encounter for supervision of normal pregnancy, unspecified, unspecified trimester: Secondary | ICD-10-CM

## 2015-01-28 DIAGNOSIS — O468X1 Other antepartum hemorrhage, first trimester: Secondary | ICD-10-CM

## 2015-01-28 DIAGNOSIS — Z01818 Encounter for other preprocedural examination: Secondary | ICD-10-CM | POA: Diagnosis not present

## 2015-01-28 DIAGNOSIS — O418X10 Other specified disorders of amniotic fluid and membranes, first trimester, not applicable or unspecified: Secondary | ICD-10-CM

## 2015-01-28 LAB — CBC WITH DIFFERENTIAL/PLATELET
Basophils Absolute: 0 10*3/uL (ref 0.0–0.1)
Basophils Relative: 0 % (ref 0–1)
EOS ABS: 0.1 10*3/uL (ref 0.0–0.7)
EOS PCT: 1 % (ref 0–5)
HCT: 39 % (ref 36.0–46.0)
Hemoglobin: 13.3 g/dL (ref 12.0–15.0)
LYMPHS ABS: 2.4 10*3/uL (ref 0.7–4.0)
Lymphocytes Relative: 29 % (ref 12–46)
MCH: 29.5 pg (ref 26.0–34.0)
MCHC: 34.1 g/dL (ref 30.0–36.0)
MCV: 86.5 fL (ref 78.0–100.0)
MONO ABS: 0.4 10*3/uL (ref 0.1–1.0)
MONOS PCT: 5 % (ref 3–12)
MPV: 11.9 fL (ref 8.6–12.4)
NEUTROS PCT: 65 % (ref 43–77)
Neutro Abs: 5.4 10*3/uL (ref 1.7–7.7)
Platelets: 227 10*3/uL (ref 150–400)
RBC: 4.51 MIL/uL (ref 3.87–5.11)
RDW: 13.4 % (ref 11.5–15.5)
WBC: 8.3 10*3/uL (ref 4.0–10.5)

## 2015-01-28 LAB — TSH: TSH: 1.766 u[IU]/mL (ref 0.350–4.500)

## 2015-01-28 NOTE — Progress Notes (Signed)
Shelly Greene is an 34 y.o. female. Who presented to the office today for follow-up on her threatened AB pregnancy. Patient was seen in the office on favor a 23rd 2016 she is a 34 year old gravida 3 para 2. On that office visit she was complaining of a dark brown discharge for a few days with intermittent with changing to bright red bleeding and some slight cramping in the lower abdomen. She denied any back pain. She denied any fever, chills, nausea or vomiting.Patient reports her last menstrual period as being November 14 which would've placed her at time of the last office visit approximate 14 weeks into her pregnancy. 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 Please refer to pass office note for detail.Patient was seen by [facility] neurosurgeon who eventually referred the patient to [facility] as a result of a right cerebellar and brainstem lesion as a result of patient's double vision and dizziness.  Patient has been seen at the [facility] department of neurosurgery with the following assessments:  Her MRI from 11/29/12 is stable compared to 01/19/12. There remains a contrast enhancing lesion mostly centered in the right middle cerebellar peduncle.  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 Imaging most consistent with benign neoplasm. Biopsy or resection very high risk, so will continue observation for now. If symptoms become worrisome, may consider steroids in future, or resection." She was seen this June and no change in her MRI and she is going to be referred next year to  the [place] brain tumor clinic. Patient is due to go the ophthalmologist this year is recommended for annual visual exams.  Patient has had serial quantitative beta-hCGs as follows: Results for [name], [name] (MRN [id number]) as of 01/13/2015 10:49  Ref. Range 01/05/2015 14:53 01/08/2015 08:38  hCG, Beta Chain, Quant, S Latest Units: mIU/mL 17748.8 21824.2         An ultrasound done in the last office visit demonstrated the following: Ultrasound: Intrauterine gestational sac was seen with a crown-rump length of 2.6 mm consistent with 5 weeks and 5 days. No cardiac activity was seen. Gestational sac was consistent with 6 weeks and 6 days. Fetal pole was noted. A small right corpus luteum cyst measuring 19 x 14 mm was noted. Left ovary was normal. Cervix is long closed. There was evidence of a subchorionic hematoma seen distal to the cervix measuring 20 x 11 mm. Patient did have a right subserous fibroid measuring 19 x 17 mm and a second one measuring 34 x 24 mm  Her ultrasound today demonstrated a nonliving intrauterine pregnancy, fetal pole consistent with 5 weeks and 4 days with no cardiac activity. Subchorionic hematoma with echogenic fluid surrounding the gestational sac for dimension of 27 x 24 mm was noted. No yolk sac was seen. Cervix is long closed. No change with right left ovary or past history of fibroids.  Patient has continued to have brownish-like discharge. Patient wished to wait until next week to see if she was spontaneously miscarry before doing a dilatation and evacuation. Patient does have history of hypothyroidism and she is on Synthroid 50 g daily.   Pertinent Gynecological  History: Menses: Vaginal bleeding during pregnancy Bleeding: See above Contraception: none DES exposure: unknown Blood transfusions: none Sexually transmitted diseases: no past history Previous GYN Procedures: Vaginal delivery  Last mammogram: Not indicated Date: Not  indicated Last pap: normal Date: 2015 OB History: G 3, P to   Menstrual History: Menarche age: 35 Patient's last menstrual period was 10/04/2014.    Past Medical History  Diagnosis Date  . Brain stem lesion     Being followed [name] Neurosurgical Dept  . GDM (gestational diabetes mellitus)   . Hypothyroidism     Past Surgical History  Procedure Laterality Date  . Eye surgery      Family History  Problem Relation Age of Onset  . Prostate cancer Father     Social History:  reports that she has never smoked. She has never used smokeless tobacco. She reports that she does not drink alcohol or use illicit drugs.  Allergies: No Known Allergies   (Not in a hospital admission)  REVIEW OF SYSTEMS: A ROS was performed and pertinent positives and negatives are included in the history.  GENERAL: No fevers or chills. HEENT: No change in vision, no earache, sore throat or sinus congestion. NECK: No pain or stiffness. CARDIOVASCULAR: No chest pain or pressure. No palpitations. PULMONARY: No shortness of breath, cough or wheeze. GASTROINTESTINAL: No abdominal pain, nausea, vomiting or diarrhea, melena or bright red blood per rectum. GENITOURINARY: No urinary frequency, urgency, hesitancy or dysuria. MUSCULOSKELETAL: No joint or muscle pain, no back pain, no recent trauma. DERMATOLOGIC: No rash, no itching, no lesions. ENDOCRINE: No polyuria, polydipsia, no heat or cold intolerance. No recent change in weight. HEMATOLOGICAL: No anemia or easy bruising or bleeding. NEUROLOGIC: No headache, seizures, numbness, tingling or weakness. PSYCHIATRIC: No depression, no loss of interest in normal activity or change in sleep pattern.     Last menstrual period 10/04/2014.  Physical Exam:  HEENT:unremarkable Neck:Supple, midline, no thyroid megaly, no carotid bruits Lungs:  Clear to auscultation no rhonchi's or wheezes Heart:Regular rate and rhythm, no murmurs or gallops Breast Exam: Not  examined Abdomen: Soft nontender no rebound or guarding Pelvic:BUS within normal limits Vagina: Brownish discharge Cervix: Brownish discharge no active bleeding Uterus: Anteverted 6-8 weeks size Adnexa: No palpable masses or tenderness Extremities: No cords, no edema Rectal: Not done  Assessment/Plan: Patient with first trimester missed AB would like to wait until next week before doing dilatation and evacuation to see if she passes the fetus on her own. We will check her her CBC today along with blood type and Rh and TSH because of her history of hypothyroidism. Patient was counseled on dilatation and evacuation and information was provided in [place].                        Patient was counseled as to the risk of surgery to include the following:  1. Infection (prohylactic antibiotics will be administered)  2. DVT/Pulmonary Embolism (prophylactic pneumo compression stockings will be used)  3.Trauma to internal organs requiring additional surgical procedure to repair any injury to     Internal organs requiring perhaps additional hospitalization days.  4.Hemmorhage requiring transfusion and blood products which carry risks such as             anaphylactic reaction, hepatitis and AIDS  Patient had received literature information on the procedure scheduled and all her questions were answered and fully accepts all risk.   Janeliz Prestwood HMD1:27 PMTD  Terrance Mass 01/28/2015, 1:18 PM  Note: This dictation was prepared with  Dragon/digital dictation along withSmart phrase technology. Any transcriptional errors that result from this process are unintentional.

## 2015-01-28 NOTE — Telephone Encounter (Signed)
I called patient to inform her D&E scheduled for Fri Marh 18. Patient said after discussing with husband and family that she has decided she does want D&E as soon as possible.  To get her in sooner would if be okay to try to schedule her at [facility]?  (I see nothing this week at [facility] for [place].)

## 2015-01-28 NOTE — Telephone Encounter (Signed)
That I will be fine

## 2015-01-29 ENCOUNTER — Other Ambulatory Visit: Payer: Self-pay

## 2015-01-29 LAB — ABO AND RH: Rh Type: POSITIVE

## 2015-01-29 NOTE — Telephone Encounter (Signed)
I will knew it just give her information

## 2015-01-29 NOTE — Telephone Encounter (Addendum)
I spoke with patient about doing surgery 7:30am tomorrow at [facility]. She now has concerns regarding finances as she is out of work and husband to be laid off.  They have insurance but she is concerned they will not be able to pay for the bills that come in or even make payments on them. She discussed w husband and decided to leave it scheduled for next Friday and see if her body will pass it on it's own.    She said she had been reading online about sometimes when pregnancy is early that MD can prescribe pills instead of having to have D&E.  She asked is that a possibilty for her?

## 2015-01-29 NOTE — Telephone Encounter (Signed)
Yes this is a possibility but the cost may be an issue as well. I would need to see her to counsel her for that medical treatment but first call and find out if she can afford the following medicationMi:fepristone 200 mg (2 tablets needed) and one tablet Misoprostol 400 mg. if she can afford [place] she should pick up the medication and make an appointment to see me to discuss procedure.

## 2015-01-29 NOTE — Telephone Encounter (Signed)
I called patient's [facility] but Mifepristone now available to them not even in their system. I called [facility] and the pharmacist is going to check into it and call me back today with info.

## 2015-01-29 NOTE — Telephone Encounter (Signed)
I still had notes from 2 years ago when you wanted to prescribe Mifepristone and it was so expensive that pharmacies did not stock it and would not order a stock bottle of #28 for $8000 just to sell the patient a few of them SO I called her Shelly Greene to check on it. I was told that it is not even in their system and they have not way to get if for patient. That pharmacist suggested I called [facility].  I called [facility]. Pharmacist Roselyn Reef researched it and called me back and said it is not even available through their wholesaler. She guesses because it was so cost prohibitive that they no longer carry it.  I will let patient know this and relay to her how expensive it is. Ok?

## 2015-01-30 ENCOUNTER — Encounter (HOSPITAL_COMMUNITY): Payer: Self-pay | Admitting: Medical

## 2015-01-30 ENCOUNTER — Inpatient Hospital Stay (HOSPITAL_COMMUNITY)
Admission: AD | Admit: 2015-01-30 | Discharge: 2015-01-30 | Disposition: A | Discharge status: Home / Self Care | Payer: 59 | Source: Ambulatory Visit | Attending: Family Medicine | Admitting: Family Medicine

## 2015-01-30 ENCOUNTER — Inpatient Hospital Stay (HOSPITAL_COMMUNITY): Payer: 59

## 2015-01-30 DIAGNOSIS — O209 Hemorrhage in early pregnancy, unspecified: Secondary | ICD-10-CM | POA: Insufficient documentation

## 2015-01-30 DIAGNOSIS — Z3A01 Less than 8 weeks gestation of pregnancy: Secondary | ICD-10-CM | POA: Insufficient documentation

## 2015-01-30 HISTORY — DX: Gestational diabetes mellitus in pregnancy, unspecified control: O24.419

## 2015-01-30 LAB — CBC WITH DIFFERENTIAL/PLATELET
BASOS PCT: 0 % (ref 0–1)
Basophils Absolute: 0 10*3/uL (ref 0.0–0.1)
Eosinophils Absolute: 0.1 10*3/uL (ref 0.0–0.7)
Eosinophils Relative: 1 % (ref 0–5)
HCT: 36.5 % (ref 36.0–46.0)
HEMOGLOBIN: 12.5 g/dL (ref 12.0–15.0)
LYMPHS ABS: 2.2 10*3/uL (ref 0.7–4.0)
Lymphocytes Relative: 23 % (ref 12–46)
MCH: 29.5 pg (ref 26.0–34.0)
MCHC: 34.2 g/dL (ref 30.0–36.0)
MCV: 86.1 fL (ref 78.0–100.0)
Monocytes Absolute: 0.6 10*3/uL (ref 0.1–1.0)
Monocytes Relative: 7 % (ref 3–12)
NEUTROS PCT: 69 % (ref 43–77)
Neutro Abs: 6.6 10*3/uL (ref 1.7–7.7)
PLATELETS: 198 10*3/uL (ref 150–400)
RBC: 4.24 MIL/uL (ref 3.87–5.11)
RDW: 12.3 % (ref 11.5–15.5)
WBC: 9.6 10*3/uL (ref 4.0–10.5)

## 2015-01-30 LAB — URINE MICROSCOPIC-ADD ON

## 2015-01-30 LAB — URINALYSIS, ROUTINE W REFLEX MICROSCOPIC
BILIRUBIN URINE: NEGATIVE
Glucose, UA: NEGATIVE mg/dL
KETONES UR: NEGATIVE mg/dL
NITRITE: NEGATIVE
PH: 8 (ref 5.0–8.0)
PROTEIN: NEGATIVE mg/dL
Specific Gravity, Urine: 1.02 (ref 1.005–1.030)
Urobilinogen, UA: 0.2 mg/dL (ref 0.0–1.0)

## 2015-01-30 LAB — WET PREP, GENITAL
Clue Cells Wet Prep HPF POC: NONE SEEN
TRICH WET PREP: NONE SEEN
Yeast Wet Prep HPF POC: NONE SEEN

## 2015-01-30 LAB — HCG, QUANTITATIVE, PREGNANCY: hCG, Beta Chain, Quant, S: 27647 m[IU]/mL — ABNORMAL HIGH (ref <5)

## 2015-01-30 NOTE — MAU Provider Note (Signed)
Chief Complaint: Vaginal Bleeding   First Provider Initiated Contact with Patient 01/30/15 0919     SUBJECTIVE HPI: Shelly Greene is a 34 y.o. G3P2002 at [redacted]w[redacted]d by LMP who presents with vaginal bleeding and cramping. Patient has noticed minimal brown spotting for 2 weeks, yesterday she had increased bright red vaginal bleeding and a large clot. Denies change in frequency of urine or bowel movements. Denies shortness of breath and claudication. Patient is concerned that this pregnancy does not feel like her previous two in that she does not have the same appetite and has not gained much weight like did with the others.  Past Medical History  Diagnosis Date  . Brain stem lesion     Being followed [facility] Neurosurgical Dept  . Hypothyroidism   . Gestational diabetes    OB History  Gravida Para Term Preterm AB SAB TAB Ectopic Multiple Living  3 2 2       2     # Outcome Date GA Lbr Len/2nd Weight Sex Delivery Anes PTL Lv  3 Current           2 Term           1 Term              Past Surgical History  Procedure Laterality Date  . Eye surgery     History   Social History  . Marital Status: Married    Spouse Name: N/A  . Number of Children: N/A  . Years of Education: N/A   Occupational History  . Not on file.   Social History Main Topics  . Smoking status: Never Smoker   . Smokeless tobacco: Never Used  . Alcohol Use: No  . Drug Use: No  . Sexual Activity: Yes   Other Topics Concern  . Not on file   Social History Narrative   No current facility-administered medications on file prior to encounter.   Current Outpatient Prescriptions on File Prior to Encounter  Medication Sig Dispense Refill  . levothyroxine (SYNTHROID, LEVOTHROID) 50 MCG tablet TAKE ONE TABLET BY MOUTH ONCE DAILY BEFORE  BREAKFAST 30 tablet 3  . Prenatal Vit-Fe Fumarate-FA (PRENATAL MULTIVITAMIN) TABS tablet Take 1 tablet by mouth daily at 12 noon.    . metroNIDAZOLE (FLAGYL) 500 MG tablet  Take 1 tablet (500 mg total) by mouth 2 (two) times daily. (Patient not taking: Reported on 01/05/2015) 14 tablet 0  . tinidazole (TINDAMAX) 500 MG tablet Take four tablets today and four tablets tomorrow at the same time (Patient not taking: Reported on 01/05/2015) 8 tablet 0   No Known Allergies  ROS: Pertinent items in HPI  OBJECTIVE Blood pressure 111/76, pulse 80, temperature 98.3 F (36.8 C), temperature source Oral, resp. rate 16, last menstrual period 10/04/2014. GENERAL: Well-developed, well-nourished female in no acute distress.  HEENT: Normocephalic HEART: normal rate RESP: normal effort, lungs clear to auscultation bilaterally ABDOMEN: Soft, non-tender EXTREMITIES: Nontender, no edema NEURO: Alert and oriented SPECULUM EXAM: NEFG, blood originating from cervix, cervix closed BIMANUAL: cervix closed; no adnexal tenderness or masses  LAB RESULTS Results for orders placed or performed during the hospital encounter of 01/30/15 (from the past 24 hour(s))  Urinalysis, Routine w reflex microscopic     Status: Abnormal   Collection Time: 01/30/15  9:00 AM  Result Value Ref Range   Color, Urine YELLOW YELLOW   APPearance HAZY (A) CLEAR   Specific Gravity, Urine 1.020 1.005 - 1.030   pH 8.0 5.0 - 8.0  Glucose, UA NEGATIVE NEGATIVE mg/dL   Hgb urine dipstick LARGE (A) NEGATIVE   Bilirubin Urine NEGATIVE NEGATIVE   Ketones, ur NEGATIVE NEGATIVE mg/dL   Protein, ur NEGATIVE NEGATIVE mg/dL   Urobilinogen, UA 0.2 0.0 - 1.0 mg/dL   Nitrite NEGATIVE NEGATIVE   Leukocytes, UA SMALL (A) NEGATIVE  Urine microscopic-add on     Status: None   Collection Time: 01/30/15  9:00 AM  Result Value Ref Range   Squamous Epithelial / LPF RARE RARE   WBC, UA 3-6 <3 WBC/hpf   RBC / HPF 7-10 <3 RBC/hpf   Bacteria, UA RARE RARE   Urine-Other MUCOUS PRESENT   Wet prep, genital     Status: Abnormal   Collection Time: 01/30/15 10:10 AM  Result Value Ref Range   Yeast Wet Prep HPF POC NONE SEEN  NONE SEEN   Trich, Wet Prep NONE SEEN NONE SEEN   Clue Cells Wet Prep HPF POC NONE SEEN NONE SEEN   WBC, Wet Prep HPF POC MODERATE (A) NONE SEEN  CBC with Differential/Platelet     Status: None   Collection Time: 01/30/15 11:35 AM  Result Value Ref Range   WBC 9.6 4.0 - 10.5 K/uL   RBC 4.24 3.87 - 5.11 MIL/uL   Hemoglobin 12.5 12.0 - 15.0 g/dL   HCT 36.5 36.0 - 46.0 %   MCV 86.1 78.0 - 100.0 fL   MCH 29.5 26.0 - 34.0 pg   MCHC 34.2 30.0 - 36.0 g/dL   RDW 12.3 11.5 - 15.5 %   Platelets 198 150 - 400 K/uL   Neutrophils Relative % 69 43 - 77 %   Neutro Abs 6.6 1.7 - 7.7 K/uL   Lymphocytes Relative 23 12 - 46 %   Lymphs Abs 2.2 0.7 - 4.0 K/uL   Monocytes Relative 7 3 - 12 %   Monocytes Absolute 0.6 0.1 - 1.0 K/uL   Eosinophils Relative 1 0 - 5 %   Eosinophils Absolute 0.1 0.0 - 0.7 K/uL   Basophils Relative 0 0 - 1 %   Basophils Absolute 0.0 0.0 - 0.1 K/uL  hCG, quantitative, pregnancy     Status: Abnormal   Collection Time: 01/30/15 11:35 AM  Result Value Ref Range   hCG, Beta Chain, Quant, S 53664 (H) <5 mIU/mL    IMAGING US Ob Comp Less 14 Wks  01/30/2015   CLINICAL DATA:  Patient with vaginal bleeding. Cramping and low back pain.  EXAM: OBSTETRIC <14 WK [place] AND TRANSVAGINAL OB US  TECHNIQUE: Both transabdominal and transvaginal ultrasound examinations were performed for complete evaluation of the gestation as well as the maternal uterus, adnexal regions, and pelvic cul-de-sac. Transvaginal technique was performed to assess early pregnancy.  COMPARISON:  None.  FINDINGS: Intrauterine gestational sac: Visualized.  Yolk sac:  Not present  Embryo:  Not present  Cardiac Activity: Not present  MSD: 21.9  mm   7 w   2  d  Maternal uterus/adnexae: Normal right and left ovaries. No free fluid in the pelvis.  IMPRESSION: Gestational sac measures 21.9 mm. Findings are suspicious but not yet definitive for failed pregnancy. Recommend follow-up US in 10-14 days for definitive diagnosis. This  recommendation follows SRU consensus guidelines: Diagnostic Criteria for Nonviable Pregnancy Early in the First Trimester. [place] Med 2013; 403:4742-59.   Electronically Signed   By: Lovey Newcomer M.D.   On: 01/30/2015 11:10   US Ob Transvaginal  01/30/2015   CLINICAL DATA:  Patient with  vaginal bleeding. Cramping and low back pain.  EXAM: OBSTETRIC <14 WK [place] AND TRANSVAGINAL OB US  TECHNIQUE: Both transabdominal and transvaginal ultrasound examinations were performed for complete evaluation of the gestation as well as the maternal uterus, adnexal regions, and pelvic cul-de-sac. Transvaginal technique was performed to assess early pregnancy.  COMPARISON:  None.  FINDINGS: Intrauterine gestational sac: Visualized.  Yolk sac:  Not present  Embryo:  Not present  Cardiac Activity: Not present  MSD: 21.9  mm   7 w   2  d  Maternal uterus/adnexae: Normal right and left ovaries. No free fluid in the pelvis.  IMPRESSION: Gestational sac measures 21.9 mm. Findings are suspicious but not yet definitive for failed pregnancy. Recommend follow-up US in 10-14 days for definitive diagnosis. This recommendation follows SRU consensus guidelines: Diagnostic Criteria for Nonviable Pregnancy Early in the First Trimester. [employer] Med 2013; 161:0960-45.   Electronically Signed   By: Lovey Newcomer M.D.   On: 01/30/2015 11:10   US Ob Transvaginal  01/28/2015   Her ultrasound today demonstrated a nonliving intrauterine pregnancy,  fetal pole consistent with 5 weeks and 4 days with no cardiac activity.  Subchorionic hematoma with echogenic fluid surrounding the gestational sac  for dimension of 27 x 24 mm was noted. No yolk sac was seen. Cervix is  long closed. No change with right left ovary or past history of fibroids.  US Ob Transvaginal  01/23/2015   Ultrasound: Intrauterine gestational sac was seen with a crown-rump length  of 2.6 mm consistent with 5 weeks and 5 days. No cardiac activity was  seen. Gestational sac was consistent  with 6 weeks and 6 days. Fetal pole  was noted. A small right corpus luteum cyst measuring 19 x 14 mm was  noted. Left ovary was normal. Cervix is long closed. There was evidence of  a subchorionic hematoma seen distal to the cervix measuring 20 x 11 mm.  Patient did have a right subserous fibroid measuring 19 x 17 mm and a  second one measuring 34 x 24 mm.    MAU COURSE  - Ultrasound - CBC with differential - hCG - GC/Chlamydia, wet prep  Differential includes subchorionic hematoma, spontaneous abortion, fibroids, cervicitis, and PCOS. [place] results   ASSESSMENT 1. Vaginal bleeding before [redacted] weeks gestation. Leading diagnosis is spontaneous abortion/blighted ovum.     PLAN Gave her option of following up in 10 days or cytotec. Patient decided to wait and return in 10 days. Advised patient to come in sooner if symptoms worsen.      Follow-up Information    Follow up with [facility] On 02/09/2015.   Specialty:  Radiology   Why:  They will call you with an appointment   Contact information:   [address] [id number] [place] [phone]      Follow up with [facility].   Why:  As needed, If symptoms worsen   Contact information:   [address] [id number] [place] [phone]       Medication List    STOP taking these medications        metroNIDAZOLE 500 MG tablet  Commonly known as:  FLAGYL     tinidazole 500 MG tablet  Commonly known as:  TINDAMAX      TAKE these medications        levothyroxine 50 MCG tablet  Commonly known as:  SYNTHROID, LEVOTHROID  TAKE ONE TABLET BY MOUTH ONCE DAILY BEFORE  BREAKFAST     prenatal multivitamin Tabs tablet  Take 1 tablet by mouth daily at 12 noon.         Lajean Manes, Med Student 01/30/2015  1:25 PM

## 2015-01-30 NOTE — Telephone Encounter (Signed)
Left message to call.

## 2015-01-30 NOTE — Discharge Instructions (Signed)
Amenaza de aborto (Threatened Miscarriage) La amenaza de aborto se produce cuando hay hemorragia vaginal durante las primeras 20semanas de [place], pero el embarazo no se interrumpe. El [employer] har pruebas para asegurarse de que el embarazo contine. La causa de la hemorragia puede ser desconocida. Este trastorno no significa que Best boy. Sin embargo, Counsellor riesgo de que el embarazo se interrumpa (aborto completo). CUIDADOS EN EL HOGAR   Asegrese de asistir a todas las citas de cuidados prenatales con el mdico.  Descanse lo suficiente.  No tenga relaciones sexuales ni use tampones si tiene hemorragia vaginal.  No se haga duchas vaginales.  No fume ni consuma drogas.  No beba alcohol.  Evite la cafena. SOLICITE AYUDA SI:  Tiene una hemorragia leve de la vagina.  Tiene dolor o clicos abdominales.  Tiene fiebre. SOLICITE AYUDA DE INMEDIATO SI:   Tiene hemorragia abundante de la vagina.  Elimina cogulos de sangre por la vagina.  Tiene mucho dolor en el abdomen o la parte baja de la espalda, clicos abdominales o calambres en la parte baja de la espalda.  Tiene fiebre, escalofros y mucho dolor abdominal. ASEGRESE DE QUE:   Comprende estas instrucciones.  Controlar su afeccin.  Recibir ayuda de inmediato si no mejora o si empeora. Document Released: 12/10/2010 Document Revised: 11/12/2013 [facility] Patient Information 2015 [place]. This information is not intended to replace advice given to you by your health care provider. Make sure you discuss any questions you have with your health care provider.

## 2015-01-30 NOTE — Telephone Encounter (Signed)
I called patient to relay the information about Mifepristone to her. Before I could she told me that she had been to [facility] today and they did u/s and they are going to take care of the rest of this situation with her pregnancy. She said they are going to give her 10 days to see if anything grows in sac and if not they are going to "do pills".  She said she has not been happy with her care here in relation to this pregnancy and after pregnancy issues resolved she will be finding another doctor.  She felt like [facility] gave her more information and is more comfortable going there for her treatment.  Ok to cancel D&E scheduled for next Friday?

## 2015-01-30 NOTE — MAU Provider Note (Signed)
History     CSN: [id number]  Arrival date and time: 01/30/15 0847   First Provider Initiated Contact with Patient 01/30/15 0919      No chief complaint on file.  HPI  Ms. Shelly Greene is a 34 y.o. G3P2002 at [redacted]w[redacted]d who presents to MAU today with complaint of vaginal bleeding since last night. The patient states off and on brown spotting x weeks. She states last normal period was in December. She had IUD removed in October and then had a normal period in November and December. She states mild lower back cramping. She denies fever. She has had mild nausea without vomiting. She denies UTI symptoms. Patient has seen Dr. Toney Rakes previously for this pregnancy but does not desire his care from now on. She plans to go to [facility] for prenatal care.   OB History    Gravida Para Term Preterm AB TAB SAB Ectopic Multiple Living   3 2 2       2       Past Medical History  Diagnosis Date  . 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 Hypothyroidism   . Gestational diabetes     Past Surgical History  Procedure Laterality Date  . Eye surgery      Family History  Problem Relation Age of Onset  . Prostate cancer Father     History  Substance Use Topics  . Smoking status: Never Smoker   . Smokeless tobacco: Never Used  . Alcohol Use: No    Allergies: No Known Allergies  No prescriptions prior to admission    Review of Systems  Constitutional: Negative for fever and malaise/fatigue.  Gastrointestinal: Positive for nausea. Negative for vomiting and abdominal pain.  Genitourinary: Negative for dysuria, urgency and frequency.       + vaginal bleeding  Neurological: Negative for weakness.   Physical Exam   Blood pressure 111/76, pulse 80, temperature 98.3 F (36.8 C), temperature source Oral, resp. rate 16, last menstrual period 10/04/2014.  Physical Exam  Constitutional: She is oriented to person, place, and time. She appears well-developed and  well-nourished. No distress.  HENT:  Head: Normocephalic.  Cardiovascular: Normal rate.   Respiratory: Effort normal.  GI: Soft. She exhibits no distension and no mass. There is no tenderness. There is no rebound and no guarding.  Genitourinary: Uterus is not enlarged and not tender. Cervix exhibits no motion tenderness, no discharge and no friability. Right adnexum displays no mass and no tenderness. Left adnexum displays no mass and no tenderness. There is bleeding (small amount of dark red blood in the vaginal vault) in the vagina. No vaginal discharge found.  Cervix: closed, thick  Neurological: She is alert and oriented to person, place, and time.  Skin: Skin is warm and dry. No erythema.  Psychiatric: She has a normal mood and affect.   Results for orders placed or performed during the hospital encounter of 01/30/15 (from the past 24 hour(s))  Urinalysis, Routine w reflex microscopic     Status: Abnormal   Collection Time: 01/30/15  9:00 AM  Result Value Ref Range   Color, Urine YELLOW YELLOW   APPearance HAZY (A) CLEAR   Specific Gravity, Urine 1.020 1.005 - 1.030   pH 8.0 5.0 - 8.0   Glucose, UA NEGATIVE NEGATIVE mg/dL   Hgb urine dipstick LARGE (A) NEGATIVE   Bilirubin Urine NEGATIVE NEGATIVE   Ketones, ur NEGATIVE NEGATIVE mg/dL   Protein, ur NEGATIVE NEGATIVE  mg/dL   Urobilinogen, UA 0.2 0.0 - 1.0 mg/dL   Nitrite NEGATIVE NEGATIVE   Leukocytes, UA SMALL (A) NEGATIVE  Urine microscopic-add on     Status: None   Collection Time: 01/30/15  9:00 AM  Result Value Ref Range   Squamous Epithelial / LPF RARE RARE   WBC, UA 3-6 <3 WBC/hpf   RBC / HPF 7-10 <3 RBC/hpf   Bacteria, UA RARE RARE   Urine-Other MUCOUS PRESENT   Wet prep, genital     Status: Abnormal   Collection Time: 01/30/15 10:10 AM  Result Value Ref Range   Yeast Wet Prep HPF POC NONE SEEN NONE SEEN   Trich, Wet Prep NONE SEEN NONE SEEN   Clue Cells Wet Prep HPF POC NONE SEEN NONE SEEN   WBC, Wet Prep HPF POC  MODERATE (A) NONE SEEN  CBC with Differential/Platelet     Status: None   Collection Time: 01/30/15 11:35 AM  Result Value Ref Range   WBC 9.6 4.0 - 10.5 K/uL   RBC 4.24 3.87 - 5.11 MIL/uL   Hemoglobin 12.5 12.0 - 15.0 g/dL   HCT 36.5 36.0 - 46.0 %   MCV 86.1 78.0 - 100.0 fL   MCH 29.5 26.0 - 34.0 pg   MCHC 34.2 30.0 - 36.0 g/dL   RDW 12.3 11.5 - 15.5 %   Platelets 198 150 - 400 K/uL   Neutrophils Relative % 69 43 - 77 %   Neutro Abs 6.6 1.7 - 7.7 K/uL   Lymphocytes Relative 23 12 - 46 %   Lymphs Abs 2.2 0.7 - 4.0 K/uL   Monocytes Relative 7 3 - 12 %   Monocytes Absolute 0.6 0.1 - 1.0 K/uL   Eosinophils Relative 1 0 - 5 %   Eosinophils Absolute 0.1 0.0 - 0.7 K/uL   Basophils Relative 0 0 - 1 %   Basophils Absolute 0.0 0.0 - 0.1 K/uL  hCG, quantitative, pregnancy     Status: Abnormal   Collection Time: 01/30/15 11:35 AM  Result Value Ref Range   hCG, Beta Chain, Quant, S 12458 (H) <5 mIU/mL    US Ob Comp Less 14 Wks  01/30/2015   CLINICAL DATA:  Patient with vaginal bleeding. Cramping and low back pain.  EXAM: OBSTETRIC <14 WK [place] AND TRANSVAGINAL OB US  TECHNIQUE: Both transabdominal and transvaginal ultrasound examinations were performed for complete evaluation of the gestation as well as the maternal uterus, adnexal regions, and pelvic cul-de-sac. Transvaginal technique was performed to assess early pregnancy.  COMPARISON:  None.  FINDINGS: Intrauterine gestational sac: Visualized.  Yolk sac:  Not present  Embryo:  Not present  Cardiac Activity: Not present  MSD: 21.9  mm   7 w   2  d  Maternal uterus/adnexae: Normal right and left ovaries. No free fluid in the pelvis.  IMPRESSION: Gestational sac measures 21.9 mm. Findings are suspicious but not yet definitive for failed pregnancy. Recommend follow-up US in 10-14 days for definitive diagnosis. This recommendation follows SRU consensus guidelines: Diagnostic Criteria for Nonviable Pregnancy Early in the First Trimester. [place] Med  2013; 099:8338-25.   Electronically Signed   By: Lovey Newcomer M.D.   On: 01/30/2015 11:10   US Ob Transvaginal  01/30/2015   CLINICAL DATA:  Patient with vaginal bleeding. Cramping and low back pain.  EXAM: OBSTETRIC <14 WK [place] AND TRANSVAGINAL OB US  TECHNIQUE: Both transabdominal and transvaginal ultrasound examinations were performed for complete evaluation of the gestation  as well as the maternal uterus, adnexal regions, and pelvic cul-de-sac. Transvaginal technique was performed to assess early pregnancy.  COMPARISON:  None.  FINDINGS: Intrauterine gestational sac: Visualized.  Yolk sac:  Not present  Embryo:  Not present  Cardiac Activity: Not present  MSD: 21.9  mm   7 w   2  d  Maternal uterus/adnexae: Normal right and left ovaries. No free fluid in the pelvis.  IMPRESSION: Gestational sac measures 21.9 mm. Findings are suspicious but not yet definitive for failed pregnancy. Recommend follow-up US in 10-14 days for definitive diagnosis. This recommendation follows SRU consensus guidelines: Diagnostic Criteria for Nonviable Pregnancy Early in the First Trimester. [place] Med 2013; 767:2094-70.   Electronically Signed   By: Lovey Newcomer M.D.   On: 01/30/2015 11:10    MAU Course  Procedures None  MDM IUGS and FP seen on [place] 01/28/15 with [facility]. [place] report from office is not official.  CBC, Quant hCG, RPR, HIV, Wet prep, GC/Chlamydia and [place] today Discussed with Dr. Nehemiah Settle. Can offer patient 10 day follow-up US or discuss cytotec. Patient requests follow-up US in 10 days.  Assessment and Plan  A: IUGS at [redacted]w[redacted]d Probably blighted ovum  P: Discharge home Bleeding precautions discussed Patient will follow-up for [place] on 02/09/15 and return to MAU for results afterwards Patient may return to MAU as needed or if her condition were to change or worsen   Luvenia Redden, PA-C  01/30/2015, 3:01 PM

## 2015-01-30 NOTE — MAU Note (Signed)
C/o some intermittent vaginal bleeding since lunchtime yesterday; c/o mild cramping since this AM;

## 2015-01-31 LAB — RPR: RPR: NONREACTIVE

## 2015-01-31 LAB — HIV ANTIBODY (ROUTINE TESTING W REFLEX): HIV Screen 4th Generation wRfx: NONREACTIVE

## 2015-02-01 NOTE — Telephone Encounter (Signed)
Yes go ahead and cancel. I think her unhappiness is how much it was going to cost her and she has no insureance. I have ben over backwards to try to help her financially even with office visits as well as giving her instructions in spanish and giving her time to pass it on her own. We have been compassionate and understanding. That's ok. Go ahead and cancel. Thanks. JF

## 2015-02-02 LAB — GC/CHLAMYDIA PROBE AMP (~~LOC~~) NOT AT ARMC
Chlamydia: NEGATIVE
Neisseria Gonorrhea: NEGATIVE

## 2015-02-02 NOTE — Telephone Encounter (Signed)
She did ask me to cancel her surgery. Surgery cancelled.

## 2015-02-06 ENCOUNTER — Inpatient Hospital Stay (HOSPITAL_COMMUNITY)
Admission: AD | Admit: 2015-02-06 | Discharge: 2015-02-06 | Disposition: A | Discharge status: Home / Self Care | Payer: 59 | Source: Ambulatory Visit | Attending: Obstetrics and Gynecology | Admitting: Obstetrics and Gynecology

## 2015-02-06 ENCOUNTER — Ambulatory Visit (HOSPITAL_COMMUNITY): Admission: RE | Admit: 2015-02-06 | Payer: Self-pay | Source: Ambulatory Visit | Admitting: Gynecology

## 2015-02-06 ENCOUNTER — Ambulatory Visit (HOSPITAL_COMMUNITY)
Admission: RE | Admit: 2015-02-06 | Discharge: 2015-02-06 | Disposition: A | Discharge status: Home / Self Care | Payer: 59 | Source: Ambulatory Visit | Attending: Medical | Admitting: Medical

## 2015-02-06 ENCOUNTER — Encounter (HOSPITAL_COMMUNITY): Admission: RE | Payer: Self-pay | Source: Ambulatory Visit

## 2015-02-06 DIAGNOSIS — R109 Unspecified abdominal pain: Secondary | ICD-10-CM | POA: Diagnosis not present

## 2015-02-06 DIAGNOSIS — Z3A01 Less than 8 weeks gestation of pregnancy: Secondary | ICD-10-CM | POA: Insufficient documentation

## 2015-02-06 DIAGNOSIS — O020 Blighted ovum and nonhydatidiform mole: Secondary | ICD-10-CM | POA: Diagnosis not present

## 2015-02-06 DIAGNOSIS — O209 Hemorrhage in early pregnancy, unspecified: Secondary | ICD-10-CM | POA: Insufficient documentation

## 2015-02-06 DIAGNOSIS — O034 Incomplete spontaneous abortion without complication: Secondary | ICD-10-CM | POA: Diagnosis present

## 2015-02-06 SURGERY — DILATION AND EVACUATION, UTERUS
Anesthesia: General

## 2015-02-06 MED ORDER — MISOPROSTOL 200 MCG PO TABS
800.0000 ug | ORAL_TABLET | Freq: Once | ORAL | Status: AC
  Administered 2015-02-06: 800 ug via VAGINAL
  Filled 2015-02-06: qty 4

## 2015-02-06 MED ORDER — PROMETHAZINE HCL 25 MG PO TABS: 12.5000 mg | ORAL_TABLET | Freq: Four times a day (QID) | ORAL | Status: DC | PRN

## 2015-02-06 MED ORDER — IBUPROFEN 600 MG PO TABS: 600.0000 mg | ORAL_TABLET | Freq: Four times a day (QID) | ORAL | Status: DC | PRN

## 2015-02-06 MED ORDER — IBUPROFEN 600 MG PO TABS
600.0000 mg | ORAL_TABLET | Freq: Once | ORAL | Status: AC
  Administered 2015-02-06: 600 mg via ORAL
  Filled 2015-02-06: qty 1

## 2015-02-06 MED ORDER — MISOPROSTOL 200 MCG PO TABS: 800.0000 ug | ORAL_TABLET | Freq: Once | ORAL | Status: DC

## 2015-02-06 NOTE — MAU Provider Note (Signed)
S: 34 y.o. [id number] @[redacted]w[redacted]d  by LMP/U/S presents to MAU for follow up results after scheduled outpatient ultrasound. She reports vaginal bleeding continues x 3 weeks and is unchanged today. She denies dizziness, n/v, or fever/chills.  Hospital Spanish interpreter used for all communication at pt request.  O: US Ob Transvaginal  02/06/2015   CLINICAL DATA:  Pregnant patient. Patient with increasing bleeding. Patient pelvic cramping also increased. Beta HCG level was 27,006 are 05/28/1939 days ago.  EXAM: TRANSVAGINAL OB ULTRASOUND  TECHNIQUE: Transvaginal ultrasound was performed for complete evaluation of the gestation as well as the maternal uterus, adnexal regions, and pelvic cul-de-sac.  COMPARISON:  01/30/2015  FINDINGS: Intrauterine gestational sac: Irregular gestational sac lies in the upper uterine segment.  Yolk sac:  None  Embryo:  None  Cardiac Activity: None  Heart Rate: Not applicable bpm  MSD: 63.0  mm   7 w   0  d  Maternal uterus/adnexae: No uterine masses. Cervix is closed. There small nabothian cysts. Normal ovaries and adnexa. No pelvic free fluid.  IMPRESSION: 1. Findings remain suspicious, but not yet definitive, for a failed intrauterine pregnancy. There has been no significant change in the gestational sac since the prior ultrasound, perform 7 days earlier. There is no yolk sac or embryo. Based on consensus guidelines, lack of a visible embryo greater than 2 weeks following a scan showing a gestational sac without a yolk sac confirms a nonviable pregnancy. Findings are suspicious but not yet definitive for failed pregnancy. Recommend follow-up US in 10-14 days for definitive diagnosis. This recommendation follows SRU consensus guidelines: Diagnostic Criteria for Nonviable Pregnancy Early in the First Trimester. [place] Med 2013; 160:1093-23. 2. No other abnormalities.  Normal ovaries and adnexa.   Electronically Signed   By: Lajean Manes M.D.   On: 02/06/2015 16:40    A:  1. Blighted  ovum    P: Dr Glo Herring to bedside to answer pt questions about Cytotec vs D&C.  Risks/benefits discussed.  Pt decided to use Cytotec and follow up in [facility] Consent form signed and witnessed for Cytotec Cytotec 800 mcg placed vaginally, Ibuprofen 600 mcg orally given in MAU D/C home Rx for Cytotec 800 mcg x 1 dose to use in 24 hours if no increase in bleeding following dose in MAU Rx for Phenergan 12.5-25 mg PO Q 6 hours and Ibuprofen 600 mg PO Q 6 hours to use as needed Follow up in 1 week in [place], appt made for pt Return to MAU as needed for emergencies   LEFTWICH-KIRBY, Taisha Pennebaker, CNM 7:17 PM

## 2015-02-06 NOTE — MAU Note (Signed)
Spoke with Augustin Schooling, CNM about plan of care.  Will give vaginal cytotec and PO ibuprofen here in MAU then discharge to home.

## 2015-02-06 NOTE — Progress Notes (Signed)
I assisted Dr Glo Herring and Lattie Haw CNM with some questions and information, by Juliann Mule Spanish Interpreter

## 2015-02-06 NOTE — Discharge Instructions (Signed)
Embarazo anembrinico  (Blighted Ovum) Un huevo sin embrin Ecologist) se produce cuando un huevo fertilizado (embrin) se adhiere a la pared uterina, pero el embrin no se desarrolla. El saco embrionario (placenta) sigue creciendo a pesar de que el embrin no crece crece ni se desarrolla.La hormona del embarazo se sigue produciendo poquer la placenta se ha formado. Esto dar como resultado una prueba de Media planner positiva a pesar de Best boy un embarazo anormal. Esto ocurre dentro del primer trimestre, a veces antes que una mujer sepa que est embarazada.  CAUSAS  Generalmente es el resultado de problemas cromosmicos. Una de las causas puede ser una divisin celular anormal o esperma u vulos de baja calidad.  SNTOMAS  Al principio, pueden experimentarse signos de embarazo, como por ejemplo:   Falta del periodo menstrual.  Fatiga.  Ganas de vomitar (nuseas).  Dolor en las [place].  Test de embarazo positivo. Luego, puede haber signos de aborto, como:   Clicos abdominales.  Sangrado o hemorragia vaginal.  Perodo menstrual ms abundante que lo normal. DIAGNSTICO  El diagnstico se realiza con una ecografia que [place] un tero o un saco gestacional vaco.  TRATAMIENTO  El mdico la ayudar a Teacher, adult education cul es el mejor tratamiento para usted. El tratamiento puede ser:   Dejar que el cuerpo elimine naturalmente el tejido del embarazo anembrinico.  Tomar medicamentos para Product/process development scientist.  Realizar un procedimiento llamado dilatacin y curetaje (D & C) para eliminar los tejidos de la placenta.  La D & C puede ser til si desea examinar el tejido para determinar la causa del aborto. Hable con su mdico acerca de los riesgos que implica este procedimiento.  INSTRUCCIONES PARA EL CUIDADO EN EL HOGAR   Haga un seguimiento con su mdico para asegurarse de que la hormona del embarazo vuelve a cero.  Espere por lo menos 1 a 3 ciclos menstruales regulares antes  de intentar quedar embarazada de nuevo, o segn lo recomendado por su mdico. SOLICITE ATENCIN MDICA DE INMEDIATO SI:   Siente un dolor abdominal cada vez ms intenso.  Tiene una hemorragia abundante o [place] 1 a 2 apsitos por hora durante ms de 2 horas.  Se siente confundida, se marea o pierde el conocimiento. Document Released: 07/20/2011 Document Revised: 01/30/2012 [facility] Patient Information 2015 [place]. This information is not intended to replace advice given to you by your health care provider. Make sure you discuss any questions you have with your health care provider.

## 2015-02-09 ENCOUNTER — Ambulatory Visit (HOSPITAL_COMMUNITY): Payer: Self-pay

## 2015-02-09 ENCOUNTER — Telehealth: Payer: Self-pay

## 2015-02-09 MED ORDER — MISOPROSTOL 200 MCG PO TABS: 800.0000 ug | ORAL_TABLET | Freq: Once | ORAL | Status: DC

## 2015-02-09 NOTE — Telephone Encounter (Signed)
Called patient with Shelly Greene. Informed patient of RX at pharmacy and instructions on how to take. Patient verbalized understanding and gratitude. Patient to come to clinic appointment on 02/12/15.

## 2015-02-09 NOTE — Telephone Encounter (Addendum)
Patient called front office staff-- used Larkin Ina as interpreter-- stating she took second dose of cytotec and still has had no bleeding. Reports after dose from MAU she noticed all 4 tablets fell out of vagina. Patient then reports she took second dose of cytotec on 02/07/15 and all 4 of those tablets fell out as well. Is wondering if she can get another dose. Informed patient I would speak to provider and call her back.   Discussed with Dr. Roselie Awkward who agrees to order Cytotec 858mcg with orders to take 457mcg (2 pills) PO and 465mcg intravaginally.

## 2015-02-12 ENCOUNTER — Ambulatory Visit (INDEPENDENT_AMBULATORY_CARE_PROVIDER_SITE_OTHER): Payer: 59 | Admitting: Obstetrics & Gynecology

## 2015-02-12 ENCOUNTER — Encounter: Payer: Self-pay | Admitting: Obstetrics & Gynecology

## 2015-02-12 ENCOUNTER — Other Ambulatory Visit: Payer: Self-pay | Admitting: Obstetrics & Gynecology

## 2015-02-12 VITALS — BP 115/62 | HR 87 | Wt 152.1 lb

## 2015-02-12 DIAGNOSIS — O039 Complete or unspecified spontaneous abortion without complication: Secondary | ICD-10-CM | POA: Diagnosis not present

## 2015-02-12 NOTE — Progress Notes (Signed)
Patient ID: Shelly Greene, female   DOB: Aug 19, 1981, 34 y.o.   MRN: [id number]  Chief Complaint  Patient presents with  . Miscarriage    HPI Shelly Greene is a 34 y.o. female.  [id number] Patient's last menstrual period was 10/04/2014. Pos preg test 12/15/14, 7 week MSD no pole on 2 [place], did not pass tissue after cytotec twice, wishes to have D&C. Canceled D&C 02/06/15 that Dr. Toney Rakes had scheduled  HPI  Past Medical History  Diagnosis Date  . Brain stem lesion     Being followed [facility] Neurosurgical Dept  . Hypothyroidism   . Gestational diabetes     Past Surgical History  Procedure Laterality Date  . Eye surgery      Family History  Problem Relation Age of Onset  . Prostate cancer Father     Social History History  Substance Use Topics  . Smoking status: Never Smoker   . Smokeless tobacco: Never Used  . Alcohol Use: No    No Known Allergies  Current Outpatient Prescriptions  Medication Sig Dispense Refill  . levothyroxine (SYNTHROID, LEVOTHROID) 50 MCG tablet TAKE ONE TABLET BY MOUTH ONCE DAILY BEFORE  BREAKFAST 30 tablet 3  . loratadine (CLARITIN) 10 MG tablet Take 10 mg by mouth daily as needed for allergies.    . Prenatal Vit-Fe Fumarate-FA (PRENATAL MULTIVITAMIN) TABS tablet Take 1 tablet by mouth daily at 12 noon.    Marland Kitchen ibuprofen (ADVIL,MOTRIN) 600 MG tablet Take 1 tablet (600 mg total) by mouth every 6 (six) hours as needed. (Patient not taking: Reported on 02/12/2015) 30 tablet 0  . misoprostol (CYTOTEC) 200 MCG tablet Place 4 tablets (800 mcg total) vaginally once. Take 2 tablets by mouth and place two tablets in the vagina. (Patient not taking: Reported on 02/12/2015) 4 tablet 0  . promethazine (PHENERGAN) 25 MG tablet Take 0.5-1 tablets (12.5-25 mg total) by mouth every 6 (six) hours as needed for nausea. (Patient not taking: Reported on 02/12/2015) 30 tablet 0   No current facility-administered medications for this visit.    Review of  Systems Review of Systems  Constitutional: Negative.   Genitourinary: Positive for vaginal bleeding and pelvic pain (cramps).    Blood pressure 115/62, pulse 87, weight 152 lb 1.6 oz (68.992 kg), last menstrual period 10/04/2014.  Physical Exam Physical Exam  Constitutional: She is oriented to person, place, and time. She appears well-developed. No distress.  Pulmonary/Chest: Effort normal. No respiratory distress.  Abdominal: Soft. There is no tenderness.  Genitourinary: Uterus normal. Vaginal discharge (blood) found.  Not tender uterus 4-6 weeks  Neurological: She is alert and oriented to person, place, and time.  Skin: Skin is warm and dry.  Psychiatric: She has a normal mood and affect. Her behavior is normal.    Data Reviewed  CLINICAL DATA: Pregnant patient. Patient with increasing bleeding. Patient pelvic cramping also increased. Beta HCG level was 27,006 are 05/28/1939 days ago.  EXAM: TRANSVAGINAL OB ULTRASOUND  TECHNIQUE: Transvaginal ultrasound was performed for complete evaluation of the gestation as well as the maternal uterus, adnexal regions, and pelvic cul-de-sac.  COMPARISON: 01/30/2015  FINDINGS: Intrauterine gestational sac: Irregular gestational sac lies in the upper uterine segment.  Yolk sac: None  Embryo: None  Cardiac Activity: None  Heart Rate: Not applicable bpm  MSD: 57.8 mm 7 w 0 d  Maternal uterus/adnexae: No uterine masses. Cervix is closed. There small nabothian cysts. Normal ovaries and adnexa. No pelvic free fluid.  IMPRESSION: 1. Findings remain suspicious, but  not yet definitive, for a failed intrauterine pregnancy. There has been no significant change in the gestational sac since the prior ultrasound, perform 7 days earlier. There is no yolk sac or embryo. Based on consensus guidelines, lack of a visible embryo greater than 2 weeks following a scan showing a gestational sac without a yolk sac confirms  a nonviable pregnancy. Findings are suspicious but not yet definitive for failed pregnancy. Recommend follow-up US in 10-14 days for definitive diagnosis. This recommendation follows SRU consensus guidelines: Diagnostic Criteria for Nonviable Pregnancy Early in the First Trimester. [place] Med 2013; 683:7290-21. 2. No other abnormalities. Normal ovaries and adnexa.   Electronically Signed  By: Lajean Manes M.D.  On: 02/06/2015 16:40  Assessment    Blighted ovum     Plan    Suction D&C posted 02/13/15 1100        Greene,Shelly 02/12/2015, 5:05 PM

## 2015-02-12 NOTE — Progress Notes (Signed)
Pt took cytotec two vaginally and two orally. She doesn't think it is working, nothing is happening.

## 2015-02-12 NOTE — Patient Instructions (Signed)
Aborto incompleto (Incomplete Miscarriage) Un aborto espontneo es la prdida repentina de un beb en gestacin (feto) antes de la semana 20 del embarazo. En un aborto espontneo, partes del feto o la placenta (alumbramiento) permanecen en el cuerpo.  El aborto espontneo puede ser Ardelia Mems experiencia que afecte emocionalmente a Geologist, engineering. Hable con su mdico si tiene preguntas sobre el aborto espontneo, el proceso de duelo y los planes futuros de [place]. CAUSAS   Algunos problemas cromosmicos pueden hacer imposible que el beb se desarrolle normalmente. Los problemas con los genes o cromosomas del beb son, en la mayora de los [place], el resultado de errores que se producen, al azar, cuando el embrin se divide y crece. Estos problemas no se heredan de los [place].  Infeccin en el cuello del tero.  Problemas hormonales.  Problemas en el cuello del tero, como tener un tero incompetente. Esto ocurre cuando los tejidos no son lo suficientemente fuertes como para Risk manager.  Problemas del tero, como un tero con forma anormal, los fibromas o anormalidades congnitas.  Ciertas enfermedades crnicas.  No fume, no beba alcohol, ni consuma drogas.  Traumatismos. SNTOMAS   Sangrado o manchado vaginal, con o sin clicos o dolor.  Dolor o clicos en el abdomen o en la cintura.  Eliminacin de lquido, tejidos o cogulos grandes por la vagina. DIAGNSTICO  El [employer] har un examen fsico. Tambin le indicar una ecografa para confirmar el aborto. Es posible que se realicen anlisis de [place]. TRATAMIENTO   Generalmente se realiza un procedimiento de dilatacin y curetaje (D y C). Durante el procedimiento de dilatacin y curetaje, el cuello del tero se abre (dilata) y se retira todo resto de tejido fetal o placentario del tero.  Si hay una infeccin, le recetarn antibiticos. Posiblemente le receten otros medicamentos para reducir Occupational psychologist) el tamao del tero si hay  mucha hemorragia.  Si su tipo de sangre es Rh negativo y el del beb es Rh positivo, necesitar una inyeccin de inmunoglobulina Rho(D). Esta inyeccin proteger a los futuros bebs de tener problemas de compatibilidad Rh en futuros embarazos.  Probablemente le indiquen reposo. Esto significa que debe quedarse en cama y levantarse nicamente para ir al bao. INSTRUCCIONES PARA EL CUIDADO EN EL HOGAR   Haga reposo segn las indicaciones del mdico.  Limite las actividades segn las indicaciones del mdico. Es posible que se le permita retomar las actividades livianas si no se le realiz un curetaje, Comptroller tratamiento adicional.  Lleve un registro de la cantidad de toallas sanitarias que [place] por da. Observe cun impregnadas (saturadas) estn. Registre esta informacin.  No  use tampones.  No se haga duchas vaginales ni tenga relaciones sexuales hasta que el mdico la autorice.  Asista a todas las citas de seguimiento para una nueva evaluacin y para Chief Strategy Officer.  Slo tome medicamentos de venta libre o recetados para Glass blower/designer, Health and safety inspector o bajar la fiebre, segn las indicaciones de su mdico.  [employer] antibiticos como le indic el mdico. Asegrese de que finaliza la prescripcin completa aunque se sienta mejor. SOLICITE ATENCIN MDICA DE INMEDIATO SI:   Siente calambres intensos en el estmago, en la espalda o en el abdomen.  Le sube la fiebre sin motivo (asegrese de Museum/gallery curator las cifras).  Elimina cogulos grandes o tejidos (consrvelos para que el [place] [employer]).  La hemorragia aumenta.  Se siente mareada, dbil o tiene episodios de desmayo. ASEGRESE DE QUE:   Comprende estas instrucciones.  Controlar su afeccin.  Recibir ayuda de inmediato si no mejora o si empeora. Document Released: 11/07/2005 Document Revised: 08/28/2013 [facility] Patient Information 2015 [place], [place]. This information is not intended to replace advice given  to you by your health care provider. Make sure you discuss any questions you have with your health care provider.  Surgery 02/13/15 at 1100, arrive at 0930, nothing to eat or drink after 1200 [place]  Woodroe Mode, MD

## 2015-02-13 ENCOUNTER — Ambulatory Visit (HOSPITAL_COMMUNITY): Payer: 59 | Admitting: Anesthesiology

## 2015-02-13 ENCOUNTER — Encounter (HOSPITAL_COMMUNITY): Payer: Self-pay | Admitting: Anesthesiology

## 2015-02-13 ENCOUNTER — Encounter (HOSPITAL_COMMUNITY)
Admission: RE | Disposition: A | Discharge status: Home / Self Care | Payer: Self-pay | Source: Ambulatory Visit | Attending: Family Medicine

## 2015-02-13 ENCOUNTER — Ambulatory Visit (HOSPITAL_COMMUNITY)
Admission: RE | Admit: 2015-02-13 | Discharge: 2015-02-13 | Disposition: A | Discharge status: Home / Self Care | Payer: 59 | Source: Ambulatory Visit | Attending: Family Medicine | Admitting: Family Medicine

## 2015-02-13 DIAGNOSIS — E039 Hypothyroidism, unspecified: Secondary | ICD-10-CM | POA: Diagnosis not present

## 2015-02-13 DIAGNOSIS — Z9889 Other specified postprocedural states: Secondary | ICD-10-CM

## 2015-02-13 DIAGNOSIS — O021 Missed abortion: Secondary | ICD-10-CM | POA: Insufficient documentation

## 2015-02-13 DIAGNOSIS — O034 Incomplete spontaneous abortion without complication: Secondary | ICD-10-CM | POA: Diagnosis present

## 2015-02-13 HISTORY — PX: DILATION AND EVACUATION: SHX1459

## 2015-02-13 LAB — CBC
HEMATOCRIT: 37.3 % (ref 36.0–46.0)
HEMOGLOBIN: 12.7 g/dL (ref 12.0–15.0)
MCH: 29.5 pg (ref 26.0–34.0)
MCHC: 34 g/dL (ref 30.0–36.0)
MCV: 86.5 fL (ref 78.0–100.0)
Platelets: 177 10*3/uL (ref 150–400)
RBC: 4.31 MIL/uL (ref 3.87–5.11)
RDW: 12.7 % (ref 11.5–15.5)
WBC: 8.3 10*3/uL (ref 4.0–10.5)

## 2015-02-13 SURGERY — DILATION AND EVACUATION, UTERUS
Anesthesia: Monitor Anesthesia Care

## 2015-02-13 MED ORDER — LIDOCAINE HCL (CARDIAC) 20 MG/ML IV SOLN
INTRAVENOUS | Status: AC
  Filled 2015-02-13: qty 5

## 2015-02-13 MED ORDER — FENTANYL CITRATE 0.05 MG/ML IJ SOLN: 25.0000 ug | INTRAMUSCULAR | Status: DC | PRN

## 2015-02-13 MED ORDER — ONDANSETRON HCL 4 MG/2ML IJ SOLN
INTRAMUSCULAR | Status: AC
  Filled 2015-02-13: qty 2

## 2015-02-13 MED ORDER — BUPIVACAINE-EPINEPHRINE 0.25% -1:200000 IJ SOLN
INTRAMUSCULAR | Status: DC | PRN
  Administered 2015-02-13: 20 mL

## 2015-02-13 MED ORDER — PROPOFOL 10 MG/ML IV EMUL
INTRAVENOUS | Status: DC | PRN
  Administered 2015-02-13: 30 mg via INTRAVENOUS

## 2015-02-13 MED ORDER — DEXAMETHASONE SODIUM PHOSPHATE 10 MG/ML IJ SOLN
INTRAMUSCULAR | Status: DC | PRN
  Administered 2015-02-13: 4 mg via INTRAVENOUS

## 2015-02-13 MED ORDER — OXYCODONE HCL 5 MG/5ML PO SOLN: 5.0000 mg | Freq: Once | ORAL | Status: DC | PRN

## 2015-02-13 MED ORDER — BUPIVACAINE HCL (PF) 0.25 % IJ SOLN
INTRAMUSCULAR | Status: AC
  Filled 2015-02-13: qty 30

## 2015-02-13 MED ORDER — DOXYCYCLINE HYCLATE 100 MG IV SOLR
200.0000 mg | INTRAVENOUS | Status: AC
  Administered 2015-02-13: 200 mg via INTRAVENOUS
  Filled 2015-02-13: qty 200

## 2015-02-13 MED ORDER — ONDANSETRON HCL 4 MG/2ML IJ SOLN
INTRAMUSCULAR | Status: DC | PRN
  Administered 2015-02-13: 4 mg via INTRAVENOUS

## 2015-02-13 MED ORDER — ONDANSETRON HCL 4 MG/2ML IJ SOLN: 4.0000 mg | Freq: Once | INTRAMUSCULAR | Status: DC | PRN

## 2015-02-13 MED ORDER — FENTANYL CITRATE 0.05 MG/ML IJ SOLN
INTRAMUSCULAR | Status: DC | PRN
  Administered 2015-02-13: 100 ug via INTRAVENOUS

## 2015-02-13 MED ORDER — KETOROLAC TROMETHAMINE 30 MG/ML IJ SOLN
INTRAMUSCULAR | Status: DC | PRN
  Administered 2015-02-13: 30 mg via INTRAVENOUS

## 2015-02-13 MED ORDER — 0.9 % SODIUM CHLORIDE (POUR BTL) OPTIME
TOPICAL | Status: DC | PRN
  Administered 2015-02-13: 1000 mL

## 2015-02-13 MED ORDER — PROPOFOL 10 MG/ML IV BOLUS
INTRAVENOUS | Status: AC
  Filled 2015-02-13: qty 20

## 2015-02-13 MED ORDER — PROPOFOL INFUSION 10 MG/ML OPTIME
INTRAVENOUS | Status: DC | PRN
  Administered 2015-02-13: 50 ug/kg/min via INTRAVENOUS

## 2015-02-13 MED ORDER — MIDAZOLAM HCL 2 MG/2ML IJ SOLN
INTRAMUSCULAR | Status: AC
  Filled 2015-02-13: qty 2

## 2015-02-13 MED ORDER — KETOROLAC TROMETHAMINE 30 MG/ML IJ SOLN
INTRAMUSCULAR | Status: AC
  Filled 2015-02-13: qty 1

## 2015-02-13 MED ORDER — FENTANYL CITRATE 0.05 MG/ML IJ SOLN
INTRAMUSCULAR | Status: AC
  Filled 2015-02-13: qty 2

## 2015-02-13 MED ORDER — MEPERIDINE HCL 25 MG/ML IJ SOLN: 6.2500 mg | INTRAMUSCULAR | Status: DC | PRN

## 2015-02-13 MED ORDER — SCOPOLAMINE 1 MG/3DAYS TD PT72: 1.0000 | MEDICATED_PATCH | Freq: Once | TRANSDERMAL | Status: DC

## 2015-02-13 MED ORDER — OXYCODONE HCL 5 MG PO TABS: 5.0000 mg | ORAL_TABLET | Freq: Once | ORAL | Status: DC | PRN

## 2015-02-13 MED ORDER — MIDAZOLAM HCL 2 MG/2ML IJ SOLN
INTRAMUSCULAR | Status: DC | PRN
  Administered 2015-02-13: 2 mg via INTRAVENOUS

## 2015-02-13 MED ORDER — LACTATED RINGERS IV SOLN
INTRAVENOUS | Status: DC
  Administered 2015-02-13 (×2): via INTRAVENOUS

## 2015-02-13 MED ORDER — DEXAMETHASONE SODIUM PHOSPHATE 4 MG/ML IJ SOLN
INTRAMUSCULAR | Status: AC
  Filled 2015-02-13: qty 1

## 2015-02-13 SURGICAL SUPPLY — 19 items

## 2015-02-13 NOTE — Anesthesia Preprocedure Evaluation (Signed)
Anesthesia Evaluation  Patient identified by MRN, date of birth, ID band  Reviewed: Allergy & Precautions, H&P , NPO status , Patient's Chart, lab work & pertinent test results  Airway Mallampati: I  TM Distance: >3 FB Neck ROM: full    Dental no notable dental hx. (+) Teeth Intact   Pulmonary neg pulmonary ROS,    Pulmonary exam normal       Cardiovascular negative cardio ROS      Neuro/Psych negative psych ROS   GI/Hepatic negative GI ROS, Neg liver ROS,   Endo/Other    Renal/GU negative Renal ROS     Musculoskeletal   Abdominal Normal abdominal exam  (+)   Peds  Hematology negative hematology ROS (+)   Anesthesia Other Findings   Reproductive/Obstetrics negative OB ROS                             Anesthesia Physical Anesthesia Plan  ASA: II  Anesthesia Plan: MAC   Post-op Pain Management:    Induction: Intravenous  Airway Management Planned:   Additional Equipment:   Intra-op Plan:   Post-operative Plan:   Informed Consent: I have reviewed the patients History and Physical, chart, labs and discussed the procedure including the risks, benefits and alternatives for the proposed anesthesia with the patient or authorized representative who has indicated his/her understanding and acceptance.     Plan Discussed with: CRNA  Anesthesia Plan Comments:         Anesthesia Quick Evaluation

## 2015-02-13 NOTE — Anesthesia Postprocedure Evaluation (Signed)
Anesthesia Post Note  Patient: Shelly Greene  Procedure(s) Performed: Procedure(s) (LRB): DILATATION AND EVACUATION (N/A)  Anesthesia type: MAC  Patient location: PACU  Post pain: Pain level controlled  Post assessment: Post-op Vital signs reviewed  Last Vitals:  Filed Vitals:   02/13/15 1200  BP: 93/58  Pulse: 63  Temp:   Resp: 13    Post vital signs: Reviewed  Level of consciousness: sedated  Complications: No apparent anesthesia complications

## 2015-02-13 NOTE — Discharge Instructions (Signed)
D&E (Dilation and Evacuation) Dilation and evacuation (D&E) is a minor operation. It involves stretching (dilation) the cervix and evacuation of the uterus. During the procedure, the cervix is dilated and tissue is gently suctioned from the inside of the uterus.  REASONS FOR DOING D&E Removal of retained placenta after giving birth.  Abortion. Miscarriage.  RISKS AND COMPLICATIONS Putting a hole (perforation) in the uterus. Excessive bleeding after the D&E.  Infection of the uterus.  Damage to the cervix.  Developing scar tissue (adhesions) inside the uterus, later causing abnormal bleeding or no monthly bleeding (amenorrhea) or problems with fertility. Complications from general or local anesthetic.     PROCEDURE You may be given a drug to make you sleep (general anesthetic) or a drug that numbs the area (local anesthetic) in and around the cervix.  You will lie on your back with your legs in stirrups.  A curved tool (suction curette) will be used to evacuate the uterus and will then be removed.  This usually takes around 15 to 30 minutes.  AFTER THE PROCEDURE You will rest in the recovery room until you are stable and feel ready to go home.  You may feel sick to your stomach (nauseous) or throw up (vomit) if you had general anesthesia.  You may have light cramping and bleeding for a couple days to 2 weeks after the procedure.  Your uterus needs to make new lining after a D&E. This may make your next period late.   HOME CARE INSTRUCTIONS Do not drive for 24 hours.  Wait 1 week before returning to strenuous activities.  You may resume your usual diet.  Drink enough water and fluids to keep your urine clear or pale yellow.  You should return to your usual bowel function. If constipation occurs, you may:  Take a mild laxative with permission from your caregiver.  Add fruit and bran to your diet.  Take showers instead of baths for two weeks Do not go swimming or use a hot tub until  your caregiver gives you permission.  Have someone with you or available for you the first 24 to 48 hours, especially if you had a general anesthetic.  Do not douche, use tampons, or have intercourse until after your follow-up appointment, or when your caregiver approves.  Only take over-the-counter or prescription medicines for pain, discomfort, or fever as directed by your caregiver. Do not take aspirin. It can cause bleeding.  If a prescription has been given to you, follow your caregiver's directions. You may be given a medicine that kills germs (antibiotic) to prevent an infection.  Keep all your follow-up appointments recommended by your caregiver.   SEEK MEDICAL CARE IF: You have increasing cramps or pain not relieved with medicine.  You develop belly (abdominal) pain, which does not seem to be related to the same area as your earlier cramping and pain.  You feel dizzy or feel like fainting.  You have a bad smelling vaginal discharge.  You develop a rash.  You develop a reaction or allergy to your medicine.   SEEK IMMEDIATE MEDICAL CARE IF: Bleeding is heavier than a normal menstrual period.  You have an oral temperature above 101F, not controlled by medicine.  You develop chest pain.  You develop shortness of breath.  You pass out.  You develop heavy vaginal bleeding with or without blood clots.   MAKE SURE YOU: Understand these instructions.  Will watch your condition.  Will get help right away if you are   shortness of breath.   You pass out.   You develop heavy vaginal bleeding with or without blood clots.   MAKE SURE YOU:  Understand these instructions.   Will watch your condition.   Will get help right away if you are not doing well or get worse.   UPDATED HEALTH PRACTICES  A Pap smear is done to screen for cervical cancer.   The first Pap smear should be done at age 25.   Between ages 62 and 54, Pap smears are repeated every 2 years.   Beginning at age 4, you are advised to have a Pap smear every 3 years as long as your past 3 Pap smears have been normal.   Some women have medical problems  that increase the chance of getting cervical cancer. Talk to your caregiver about these problems. It is especially important to talk to your caregiver if a new problem develops soon after your last Pap smear. In these cases, your caregiver may recommend more frequent screening and Pap smears.   The above recommendations are the same for women who have or have not gotten the vaccine for HPV (human papillomavirus).   If you had a uterus removal (hysterectomy) for a problem that was not a cancer or a condition that could lead to cancer, then you no longer need Pap smears.   If you are between ages 96 and 35, and you have had normal Pap smears going back 10 years, you no longer need Pap smears.   If you have had past treatment for cervical cancer or a condition that could lead to cancer, you need Pap smears and screening for cancer for at least 20 years after your treatment.   Continue monthly breast self-examinations. Your caregiver can provide information and instructions for breast self-examination.  ExitCare Patient Information 2011 [place].    Post Anesthesia Home Care Instructions  Activity: Get plenty of rest for the remainder of the day. A responsible adult should stay with you for 24 hours following the procedure.  For the next 24 hours, DO NOT: -Drive a car -Paediatric nurse -Drink alcoholic beverages -Take any medication unless instructed by your physician -Make any legal decisions or sign important papers.  Meals: Start with liquid foods such as gelatin or soup. Progress to regular foods as tolerated. Avoid greasy, spicy, heavy foods. If nausea and/or vomiting occur, drink only clear liquids until the nausea and/or vomiting subsides. Call your physician if vomiting continues.  Special Instructions/Symptoms: Your throat may feel dry or sore from the anesthesia or the breathing tube placed in your throat during surgery. If this causes discomfort, gargle with warm salt  water. The discomfort should disappear within 24 hours.

## 2015-02-13 NOTE — Op Note (Signed)
Eduardo Osier PROCEDURE DATE: 02/13/2015  PREOPERATIVE DIAGNOSIS: [redacted]w[redacted]d week missed abortion. POSTOPERATIVE DIAGNOSIS: The same. PROCEDURE:     Dilation and Evacuation. SURGEON:  Dr. Nehemiah Settle  INDICATIONS: 34 y.o. [id number] MAB at [redacted]w[redacted]d weeks gestation, needing surgical completion.  Risks of surgery were discussed with the patient including but not limited to: bleeding which may require transfusion; infection which may require antibiotics; injury to uterus or surrounding organs;need for additional procedures including laparotomy or laparoscopy; possibility of intrauterine scarring which may impair future fertility; and other postoperative/anesthesia complications. Written informed consent was obtained.    FINDINGS:  A 8 week size anteverted uterus, moderate amounts of products of conception, specimen sent to pathology.  ANESTHESIA:    Monitored intravenous sedation, paracervical block. INTRAVENOUS FLUIDS:  700 ml of LR ESTIMATED BLOOD LOSS:  Less than 20 ml. SPECIMENS:  Products of conception sent to pathology COMPLICATIONS:  None immediate.  PROCEDURE DETAILS:  The patient received intravenous antibiotics while in the preoperative area.  She was then taken to the operating room where general anesthesia was administered and was found to be adequate.  After an adequate timeout was performed, she was placed in the dorsal lithotomy position and examined; then prepped and draped in the sterile manner.   Her bladder was catheterized for an unmeasured amount of clear, yellow urine. A vaginal speculum was then placed in the patient's vagina and a paracervical block using 1% Marcaine was administered.  A single tooth tenaculum was then applied to the anterior lip of the cervix. The cervix was gently dilated to accommodate a 12 mm suction curette that was gently advanced to the uterine fundus.  The suction device was then activated and curette slowly rotated to clear the uterus of products of  conception.  A sharp curettage was then performed to confirm completer emptying of the uterus.There was minimal bleeding noted and the tenaculum removed with good hemostasis noted.  The patient tolerated the procedure well.  The patient was taken to the recovery area in stable condition.   Truett Mainland, DO 02/13/2015 11:32 AM

## 2015-02-13 NOTE — Transfer of Care (Signed)
Immediate Anesthesia Transfer of Care Note  Patient: Shelly Greene  Procedure(s) Performed: Procedure(s): DILATATION AND EVACUATION (N/A)  Patient Location: PACU  Anesthesia Type:MAC  Level of Consciousness: awake, alert  and oriented  Airway & Oxygen Therapy: Patient Spontanous Breathing and Patient connected to nasal cannula oxygen  Post-op Assessment: Report given to RN, Post -op Vital signs reviewed and stable and Patient moving all extremities  Post vital signs: Reviewed and stable  Last Vitals:  Filed Vitals:   02/13/15 1003  BP: 109/66  Pulse: 82  Temp: 36.7 C  Resp: 18    Complications: No apparent anesthesia complications

## 2015-02-13 NOTE — H&P (Signed)
Preoperative History and Physical  Shelly Greene is a 34 y.o. [id number] here for surgical management of failed pregnancy at [redacted]w[redacted]d.  An attempt was made to manage the failed pregnancy with cytotec, but this failed.  No significant preoperative concerns.  Proposed surgery: Dilation and Evacuation  Past Medical History  Diagnosis Date  . Brain stem lesion     Being followed [facility]  . Hypothyroidism   . Gestational diabetes    Past Surgical History  Procedure Laterality Date  . Eye surgery     OB History    Gravida Para Term Preterm AB TAB SAB Ectopic Multiple Living   3 2 2       2      Patient denies any cervical dysplasia or STIs. No current facility-administered medications on file prior to encounter.   Current Outpatient Prescriptions on File Prior to Encounter  Medication Sig Dispense Refill  . ibuprofen (ADVIL,MOTRIN) 600 MG tablet Take 1 tablet (600 mg total) by mouth every 6 (six) hours as needed. (Patient not taking: Reported on 02/12/2015) 30 tablet 0  . levothyroxine (SYNTHROID, LEVOTHROID) 50 MCG tablet TAKE ONE TABLET BY MOUTH ONCE DAILY BEFORE  BREAKFAST (Patient not taking: Reported on 02/12/2015) 30 tablet 3  . loratadine (CLARITIN) 10 MG tablet Take 10 mg by mouth daily as needed for allergies.    . misoprostol (CYTOTEC) 200 MCG tablet Place 4 tablets (800 mcg total) vaginally once. Take 2 tablets by mouth and place two tablets in the vagina. (Patient not taking: Reported on 02/12/2015) 4 tablet 0  . Prenatal Vit-Fe Fumarate-FA (PRENATAL MULTIVITAMIN) TABS tablet Take 1 tablet by mouth daily at 12 noon.    . promethazine (PHENERGAN) 25 MG tablet Take 0.5-1 tablets (12.5-25 mg total) by mouth every 6 (six) hours as needed for nausea. (Patient not taking: Reported on 02/12/2015) 30 tablet 0   No Known Allergies Social History:   reports that she has never smoked. She has never used smokeless tobacco. She reports that she does not drink  alcohol or use illicit drugs.  Family History  Problem Relation Age of Onset  . Prostate cancer Father     Review of Systems: Noncontributory  PHYSICAL EXAM: Blood pressure 109/66, pulse 82, temperature 98.1 F (36.7 C), temperature source Oral, resp. rate 18, height 5\' 3"  (1.6 m), last menstrual period 10/04/2014, SpO2 100 %. General appearance - alert, well appearing, and in no distress Chest - clear to auscultation, no wheezes, rales or rhonchi, symmetric air entry Heart - normal rate and regular rhythm Abdomen - soft, nontender, nondistended, no masses or organomegaly Pelvic - examination not indicated Extremities - peripheral pulses normal, no pedal edema, no clubbing or cyanosis  Labs: Results for orders placed or performed during the hospital encounter of 02/13/15 (from the past 336 hour(s))  CBC   Collection Time: 02/13/15 10:00 AM  Result Value Ref Range   WBC 8.3 4.0 - 10.5 K/uL   RBC 4.31 3.87 - 5.11 MIL/uL   Hemoglobin 12.7 12.0 - 15.0 g/dL   HCT 37.3 36.0 - 46.0 %   MCV 86.5 78.0 - 100.0 fL   MCH 29.5 26.0 - 34.0 pg   MCHC 34.0 30.0 - 36.0 g/dL   RDW 12.7 11.5 - 15.5 %   Platelets 177 150 - 400 K/uL  Results for orders placed or performed during the hospital encounter of 01/30/15 (from the past 336 hour(s))  CBC with Differential/Platelet   Collection Time: 01/30/15 11:35 AM  Result  Value Ref Range   WBC 9.6 4.0 - 10.5 K/uL   RBC 4.24 3.87 - 5.11 MIL/uL   Hemoglobin 12.5 12.0 - 15.0 g/dL   HCT 36.5 36.0 - 46.0 %   MCV 86.1 78.0 - 100.0 fL   MCH 29.5 26.0 - 34.0 pg   MCHC 34.2 30.0 - 36.0 g/dL   RDW 12.3 11.5 - 15.5 %   Platelets 198 150 - 400 K/uL   Neutrophils Relative % 69 43 - 77 %   Neutro Abs 6.6 1.7 - 7.7 K/uL   Lymphocytes Relative 23 12 - 46 %   Lymphs Abs 2.2 0.7 - 4.0 K/uL   Monocytes Relative 7 3 - 12 %   Monocytes Absolute 0.6 0.1 - 1.0 K/uL   Eosinophils Relative 1 0 - 5 %   Eosinophils Absolute 0.1 0.0 - 0.7 K/uL   Basophils Relative 0  0 - 1 %   Basophils Absolute 0.0 0.0 - 0.1 K/uL  RPR   Collection Time: 02/17/15 11:35 AM  Result Value Ref Range   RPR Ser Ql Non Reactive Non Reactive  hCG, quantitative, pregnancy   Collection Time: Feb 17, 2015 11:35 AM  Result Value Ref Range   hCG, Beta Chain, Quant, S 27647 (H) <5 mIU/mL  HIV antibody   Collection Time: 2015/02/17 11:35 AM  Result Value Ref Range   HIV Screen 4th Generation wRfx Non Reactive Non Reactive    Imaging Studies: US Ob Comp Less 14 Wks  2015-02-17   CLINICAL DATA:  Patient with vaginal bleeding. Cramping and low back pain.  EXAM: OBSTETRIC <14 WK [place] AND TRANSVAGINAL OB US  TECHNIQUE: Both transabdominal and transvaginal ultrasound examinations were performed for complete evaluation of the gestation as well as the maternal uterus, adnexal regions, and pelvic cul-de-sac. Transvaginal technique was performed to assess early pregnancy.  COMPARISON:  None.  FINDINGS: Intrauterine gestational sac: Visualized.  Yolk sac:  Not present  Embryo:  Not present  Cardiac Activity: Not present  MSD: 21.9  mm   7 w   2  d  Maternal uterus/adnexae: Normal right and left ovaries. No free fluid in the pelvis.  IMPRESSION: Gestational sac measures 21.9 mm. Findings are suspicious but not yet definitive for failed pregnancy. Recommend follow-up US in 10-14 days for definitive diagnosis. This recommendation follows SRU consensus guidelines: Diagnostic Criteria for Nonviable Pregnancy Early in the First Trimester. [place] Med 2013; 196:2229-79.   Electronically Signed   By: Lovey Newcomer M.D.   On: 02-17-2015 11:10   US Ob Transvaginal  02/06/2015   CLINICAL DATA:  Pregnant patient. Patient with increasing bleeding. Patient pelvic cramping also increased. Beta HCG level was 27,006 are 05/28/1939 days ago.  EXAM: TRANSVAGINAL OB ULTRASOUND  TECHNIQUE: Transvaginal ultrasound was performed for complete evaluation of the gestation as well as the maternal uterus, adnexal regions, and pelvic  cul-de-sac.  COMPARISON:  Feb 17, 2015  FINDINGS: Intrauterine gestational sac: Irregular gestational sac lies in the upper uterine segment.  Yolk sac:  None  Embryo:  None  Cardiac Activity: None  Heart Rate: Not applicable bpm  MSD: 89.2  mm   7 w   0  d  Maternal uterus/adnexae: No uterine masses. Cervix is closed. There small nabothian cysts. Normal ovaries and adnexa. No pelvic free fluid.  IMPRESSION: 1. Findings remain suspicious, but not yet definitive, for a failed intrauterine pregnancy. There has been no significant change in the gestational sac since the prior ultrasound, perform 7 days earlier. There  is no yolk sac or embryo. Based on consensus guidelines, lack of a visible embryo greater than 2 weeks following a scan showing a gestational sac without a yolk sac confirms a nonviable pregnancy. Findings are suspicious but not yet definitive for failed pregnancy. Recommend follow-up US in 10-14 days for definitive diagnosis. This recommendation follows SRU consensus guidelines: Diagnostic Criteria for Nonviable Pregnancy Early in the First Trimester. [employer] Med 2013; 009:2330-07. 2. No other abnormalities.  Normal ovaries and adnexa.   Electronically Signed   By: Lajean Manes M.D.   On: 02/06/2015 16:40   US Ob Transvaginal  01/30/2015   CLINICAL DATA:  Patient with vaginal bleeding. Cramping and low back pain.  EXAM: OBSTETRIC <14 WK [place] AND TRANSVAGINAL OB US  TECHNIQUE: Both transabdominal and transvaginal ultrasound examinations were performed for complete evaluation of the gestation as well as the maternal uterus, adnexal regions, and pelvic cul-de-sac. Transvaginal technique was performed to assess early pregnancy.  COMPARISON:  None.  FINDINGS: Intrauterine gestational sac: Visualized.  Yolk sac:  Not present  Embryo:  Not present  Cardiac Activity: Not present  MSD: 21.9  mm   7 w   2  d  Maternal uterus/adnexae: Normal right and left ovaries. No free fluid in the pelvis.  IMPRESSION:  Gestational sac measures 21.9 mm. Findings are suspicious but not yet definitive for failed pregnancy. Recommend follow-up US in 10-14 days for definitive diagnosis. This recommendation follows SRU consensus guidelines: Diagnostic Criteria for Nonviable Pregnancy Early in the First Trimester. [employer] Med 2013; 622:6333-54.   Electronically Signed   By: Lovey Newcomer M.D.   On: 01/30/2015 11:10   US Ob Transvaginal  01/28/2015   Her ultrasound today demonstrated a nonliving intrauterine pregnancy,  fetal pole consistent with 5 weeks and 4 days with no cardiac activity.  Subchorionic hematoma with echogenic fluid surrounding the gestational sac  for dimension of 27 x 24 mm was noted. No yolk sac was seen. Cervix is  long closed. No change with right left ovary or past history of fibroids.   Assessment: Patient Active Problem List   Diagnosis Date Noted  . Miscarriage 01/28/2015  . Pregnant 01/05/2015  . Other specified hypothyroidism 01/05/2015  . 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 Rectal itching 01/31/2011  . AMENORRHEA 04/18/2008  . PILAR CYST 02/27/2007  . OVERWEIGHT 01/18/2007  . CARPAL TUNNEL SYNDROME 01/18/2007    Plan: Patient will undergo surgical management with Dilation and evacuation.   The risks of surgery were discussed in detail with the patient including but not limited to: bleeding which may require transfusion or reoperation; infection which may require antibiotics; injury to surrounding organs which may involve bowel, bladder, ureters ; need for additional procedures including laparoscopy or laparotomy; thromboembolic phenomenon, surgical site problems and other postoperative/anesthesia complications. Likelihood of success in alleviating the patient's condition was discussed. Routine postoperative instructions will be reviewed with the patient and her family in detail after surgery.  The patient concurred with the proposed plan, giving informed written consent for the surgery.   Patient has been NPO since last night she will remain NPO for procedure.  Anesthesia and OR aware.  Preoperative prophylactic antibiotics (doxycycline) and SCDs ordered on call to the OR.  To OR when ready.  Truett Mainland, DO  02/13/2015, 10:54 AM

## 2015-02-16 ENCOUNTER — Encounter (HOSPITAL_COMMUNITY): Payer: Self-pay | Admitting: Family Medicine

## 2015-05-11 ENCOUNTER — Other Ambulatory Visit: Payer: Self-pay | Admitting: Gynecology

## 2015-12-05 ENCOUNTER — Encounter (HOSPITAL_COMMUNITY): Payer: Self-pay | Admitting: *Deleted

## 2016-01-28 IMAGING — US US OB COMP LESS 14 WK
1 series · 14 of 28 positions shown · non-contrast
Comparison: None.

CLINICAL DATA: Patient with vaginal bleeding. Cramping and low back
pain.

EXAM:
OBSTETRIC <14 WK US AND TRANSVAGINAL OB US
TECHNIQUE: Both transabdominal and transvaginal ultrasound examinations were
performed for complete evaluation of the gestation as well as the
maternal uterus, adnexal regions, and pelvic cul-de-sac.
Transvaginal technique was performed to assess early pregnancy.

[Series 1: us ob transvaginal · 14 of 41 slices shown]
[im 2/41]
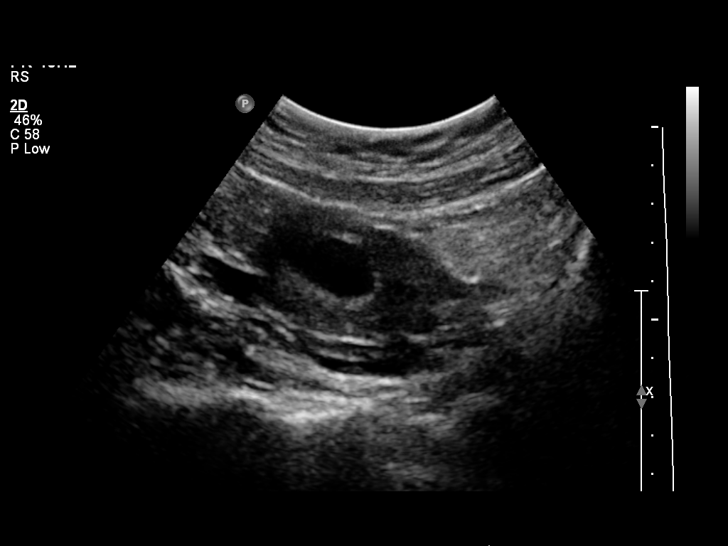
[im 5/41]
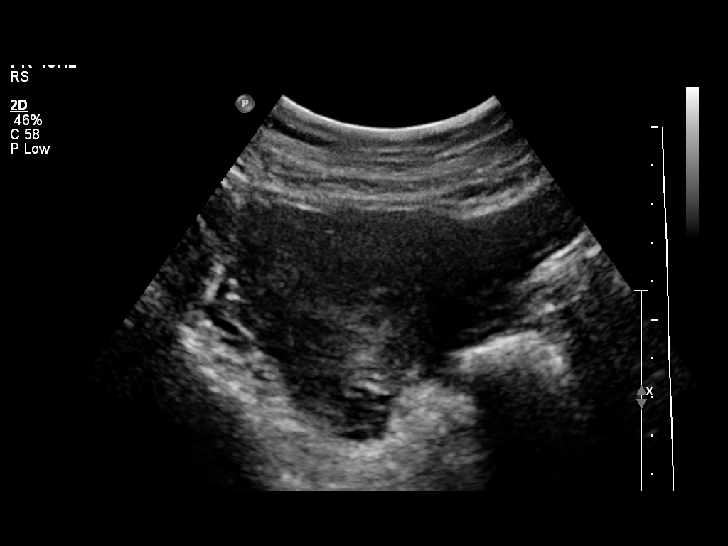
[im 8/41]
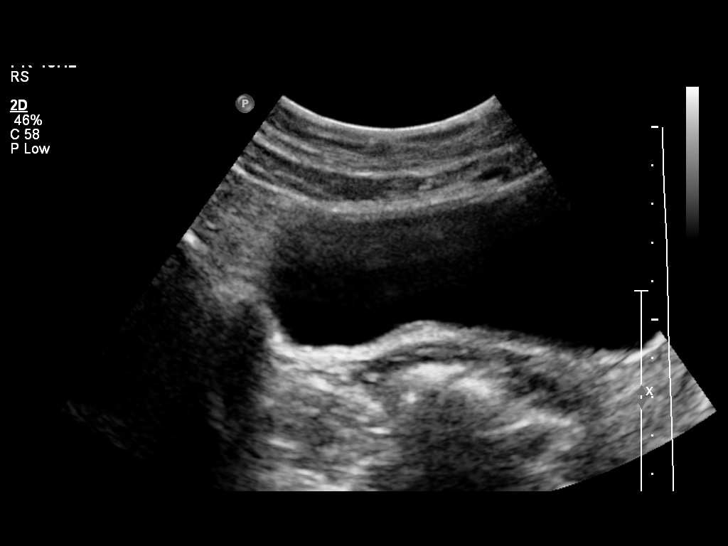
[im 11/41]
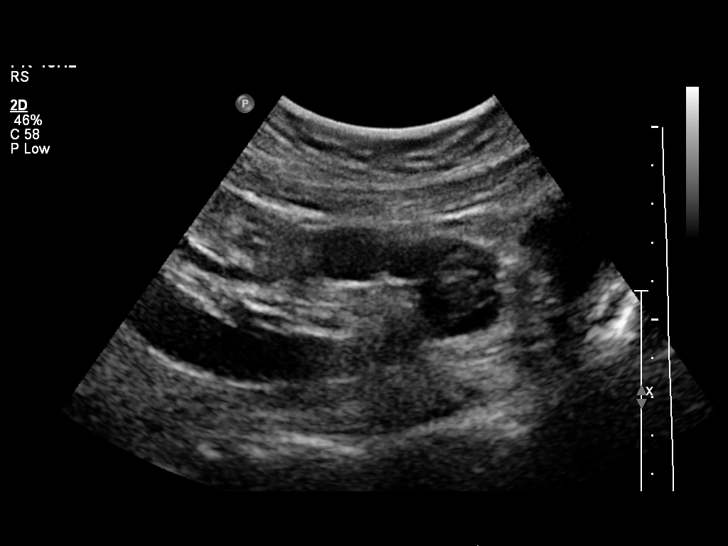
[im 14/41]
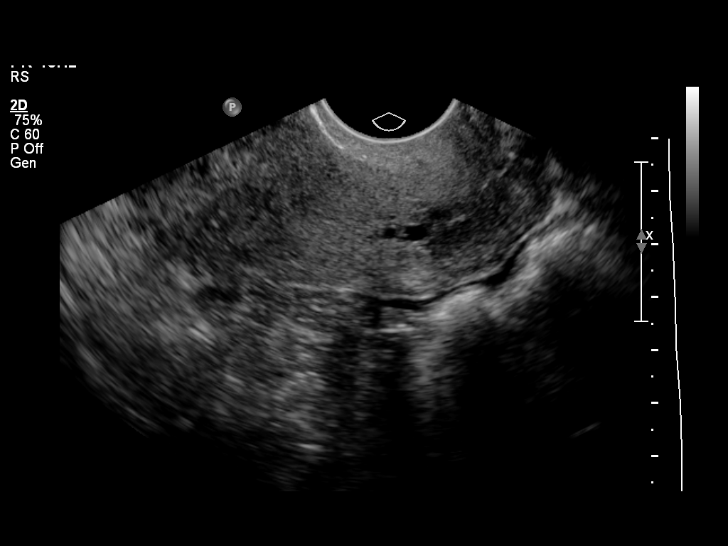
[im 17/41]
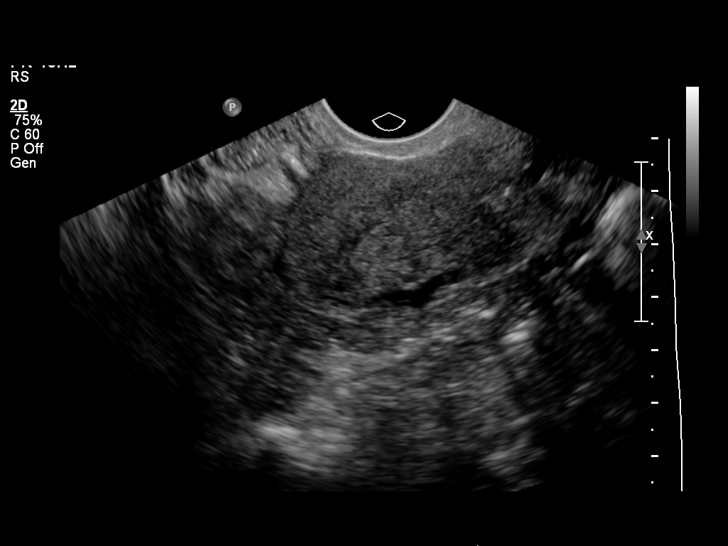
[im 20/41]
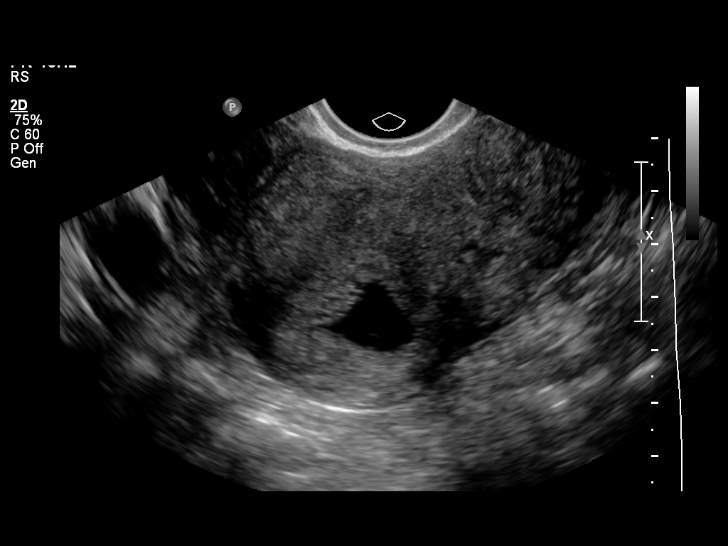
[im 23/41]
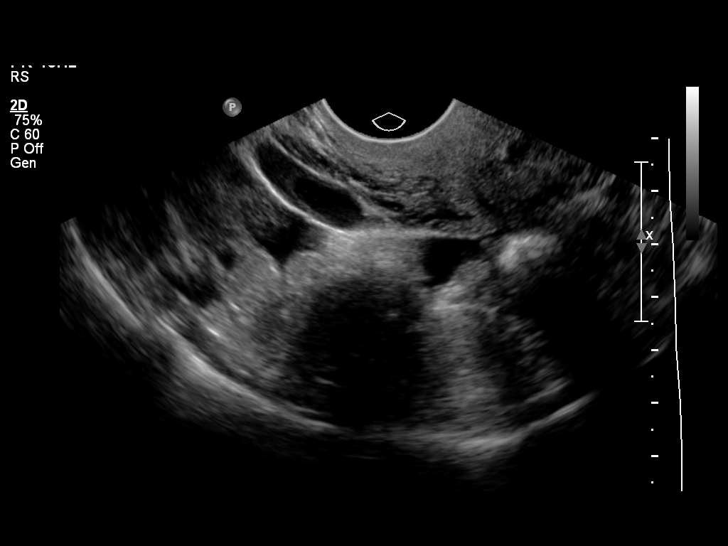
[im 26/41]
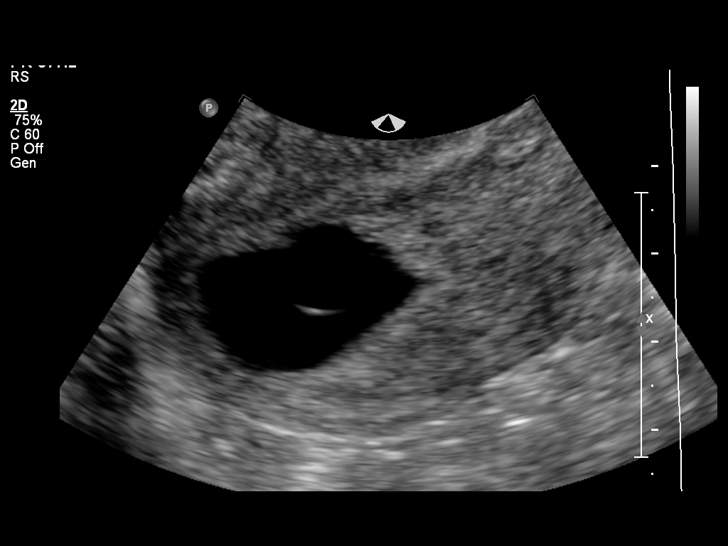
[im 29/41]
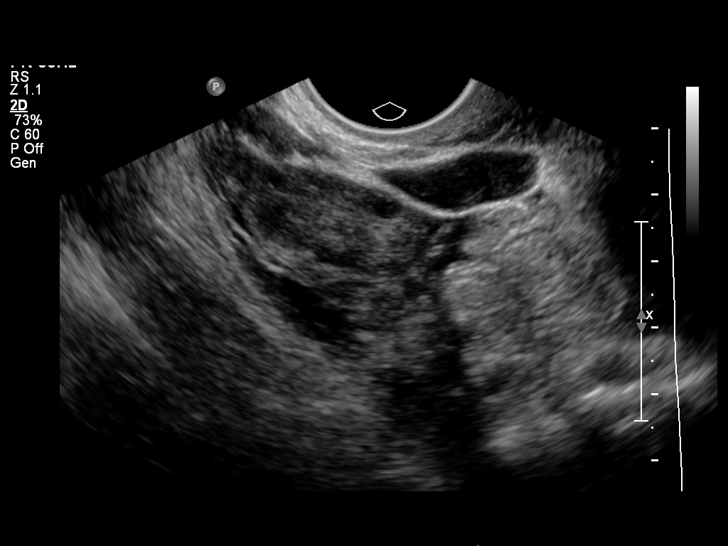
[im 32/41]
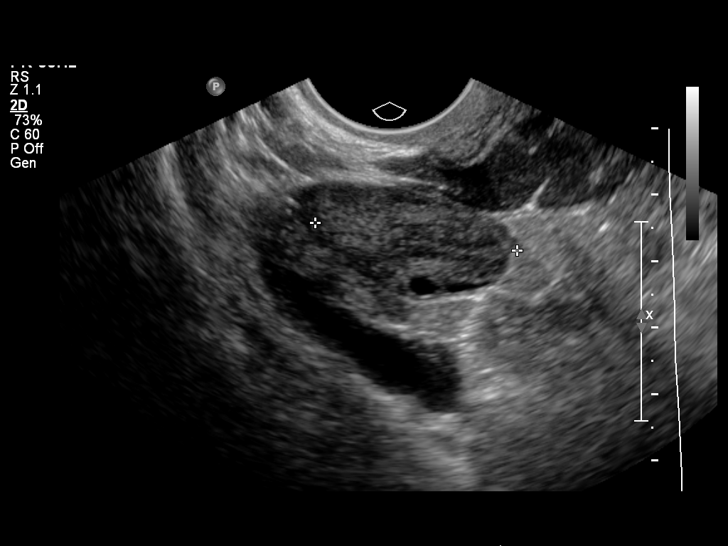
[im 35/41]
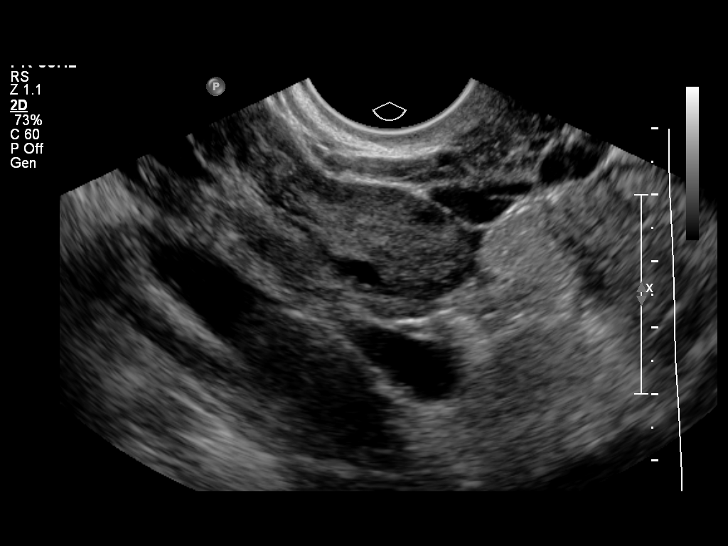
[im 38/41]
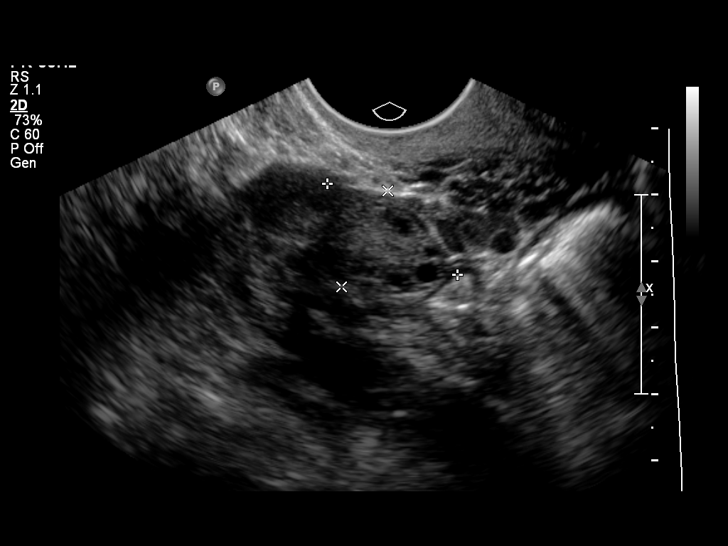
[im 41/41]
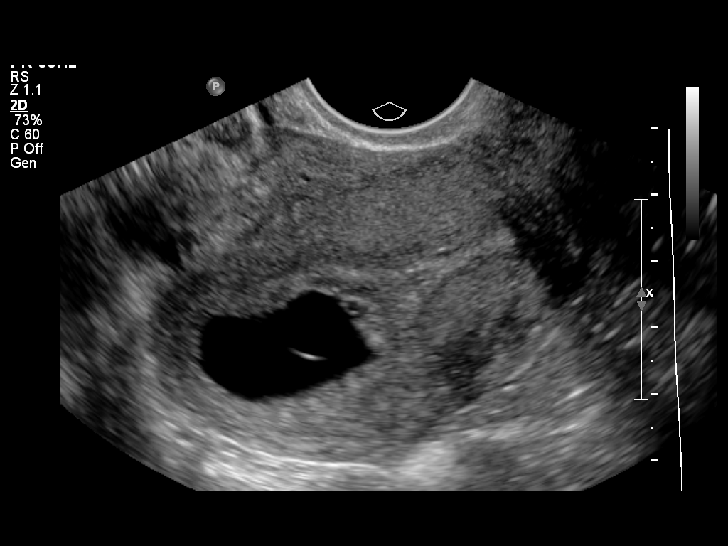

[14 of 28 positions shown; findings below may reference images not displayed]

FINDINGS: Intrauterine gestational sac: Visualized.

Yolk sac:  Not present

Embryo:  Not present

Cardiac Activity: Not present

MSD: 21.9  mm   7 w   2  d

Maternal uterus/adnexae: Normal right and left ovaries. No free
fluid in the pelvis.
IMPRESSION: Gestational sac measures 21.9 mm. Findings are suspicious but not
yet definitive for failed pregnancy. Recommend follow-up US in 10-14
days for definitive diagnosis. This recommendation follows SRU
consensus guidelines: Diagnostic Criteria for Nonviable Pregnancy
Early in the First Trimester. N Engl J Med 1478; [DATE].

## 2016-02-18 ENCOUNTER — Ambulatory Visit: Payer: 59 | Admitting: Women's Health

## 2016-06-03 ENCOUNTER — Ambulatory Visit: Payer: 59 | Admitting: Gynecology

## 2016-06-09 ENCOUNTER — Ambulatory Visit (INDEPENDENT_AMBULATORY_CARE_PROVIDER_SITE_OTHER): Payer: Self-pay | Admitting: Gynecology

## 2016-06-09 ENCOUNTER — Encounter: Payer: Self-pay | Admitting: Gynecology

## 2016-06-09 VITALS — BP 124/86 | Ht 63.5 in | Wt 152.0 lb

## 2016-06-09 DIAGNOSIS — Z8639 Personal history of other endocrine, nutritional and metabolic disease: Secondary | ICD-10-CM

## 2016-06-09 DIAGNOSIS — Z01419 Encounter for gynecological examination (general) (routine) without abnormal findings: Secondary | ICD-10-CM

## 2016-06-09 DIAGNOSIS — H02409 Unspecified ptosis of unspecified eyelid: Secondary | ICD-10-CM | POA: Insufficient documentation

## 2016-06-09 DIAGNOSIS — O039 Complete or unspecified spontaneous abortion without complication: Secondary | ICD-10-CM | POA: Insufficient documentation

## 2016-06-09 DIAGNOSIS — N938 Other specified abnormal uterine and vaginal bleeding: Secondary | ICD-10-CM

## 2016-06-09 DIAGNOSIS — R42 Dizziness and giddiness: Secondary | ICD-10-CM

## 2016-06-09 DIAGNOSIS — G939 Disorder of brain, unspecified: Secondary | ICD-10-CM

## 2016-06-09 DIAGNOSIS — N941 Unspecified dyspareunia: Secondary | ICD-10-CM

## 2016-06-09 DIAGNOSIS — H02401 Unspecified ptosis of right eyelid: Secondary | ICD-10-CM

## 2016-06-09 DIAGNOSIS — E038 Other specified hypothyroidism: Secondary | ICD-10-CM

## 2016-06-09 HISTORY — DX: Unspecified ptosis of unspecified eyelid: H02.409

## 2016-06-09 LAB — LIPID PANEL
CHOLESTEROL: 150 mg/dL (ref 125–200)
HDL: 52 mg/dL (ref >=46)
LDL CALC: 75 mg/dL (ref <130)
Total CHOL/HDL Ratio: 2.9 Ratio (ref <=5.0)
Triglycerides: 117 mg/dL (ref <150)
VLDL: 23 mg/dL (ref <30)

## 2016-06-09 LAB — COMPREHENSIVE METABOLIC PANEL
ALT: 10 U/L (ref 6–29)
AST: 19 U/L (ref 10–30)
Albumin: 3.9 g/dL (ref 3.6–5.1)
Alkaline Phosphatase: 46 U/L (ref 33–115)
BILIRUBIN TOTAL: 0.7 mg/dL (ref 0.2–1.2)
BUN: 8 mg/dL (ref 7–25)
CO2: 26 mmol/L (ref 20–31)
Calcium: 9 mg/dL (ref 8.6–10.2)
Chloride: 104 mmol/L (ref 98–110)
Creat: 0.56 mg/dL (ref 0.50–1.10)
Glucose, Bld: 86 mg/dL (ref 65–99)
Potassium: 4.9 mmol/L (ref 3.5–5.3)
SODIUM: 137 mmol/L (ref 135–146)
TOTAL PROTEIN: 6.4 g/dL (ref 6.1–8.1)

## 2016-06-09 LAB — TSH: TSH: 0.53 mIU/L

## 2016-06-09 LAB — CBC WITH DIFFERENTIAL/PLATELET
Basophils Absolute: 0 cells/uL (ref 0–200)
Basophils Relative: 0 %
EOS ABS: 54 {cells}/uL (ref 15–500)
Eosinophils Relative: 1 %
HCT: 38.8 % (ref 35.0–45.0)
Hemoglobin: 12.7 g/dL (ref 11.7–15.5)
Lymphocytes Relative: 33 %
Lymphs Abs: 1782 cells/uL (ref 850–3900)
MCH: 28.6 pg (ref 27.0–33.0)
MCHC: 32.7 g/dL (ref 32.0–36.0)
MCV: 87.4 fL (ref 80.0–100.0)
MONOS PCT: 9 %
MPV: 12.2 fL (ref 7.5–12.5)
Monocytes Absolute: 486 cells/uL (ref 200–950)
NEUTROS ABS: 3078 {cells}/uL (ref 1500–7800)
Neutrophils Relative %: 57 %
PLATELETS: 173 10*3/uL (ref 140–400)
RBC: 4.44 MIL/uL (ref 3.80–5.10)
RDW: 13.2 % (ref 11.0–15.0)
WBC: 5.4 10*3/uL (ref 3.8–10.8)

## 2016-06-09 MED ORDER — LEVOTHYROXINE SODIUM 50 MCG PO TABS: 50.0000 ug | ORAL_TABLET | Freq: Every day | ORAL | Status: DC

## 2016-06-09 NOTE — Progress Notes (Signed)
Shelly Greene [birth year] [id number]   History:    35 y.o.  for annual gyn exam who is a gravida 4 para 2 Ab2 (1 D&C). Patient stated that early this month she had a spontaneous miscarriage at home. Did not seek any medical attention. She's had some mild cramping. Patient had not been using any form of contraception. She has not been seen the office since 2016. 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 Last MRI 11/29/12 is stable compared to 01/19/12. There remains a contrast enhancing lesion mostly centered in the right middle cerebellar peduncle. 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 Imaging most consistent with benign neoplasm. Biopsy or resection very high risk, so will continue observation for now. If symptoms become worrisome, may consider steroids in future, or resection."  MRI scan May 2015 They had reported that the right brain stem cerebellar peduncle lesion was stable with no change from previous scan of 2013. They had reported that because of the location of this lesion it was not recommended to proceed with resection or other treatments at the time. They did recommend follow-up MRI in one year.  I had reviewed the notes from Dr. Eden Emms who was the physician that saw her at [facility] back in 2015 in his note indicated that she was going to be referred to the [facility] brain tumor clinic and patient was awaiting further call and never followed up. Since that time she has been complaining of some pressure in her right ear and a drop of her right eyelid. She also has occasional imbalance and also no headaches but suffers from  vertigo.   Patient with no past history of any abnormal Pap smears. She states she has been getting her Synthroid from [place] [facility] question where she was getting her refill since I have not seen her in over a year.   Past medical history,surgical history, family history and social history were all reviewed and documented in the EPIC chart.  Gynecologic History Patient's last menstrual period was 05/23/2016. Contraception: none Last Pap:  2015. Results were: normal Last mammogram:  Not indicated. Results were: not indicated  Obstetric History OB History  Gravida Para Term Preterm AB SAB TAB Ectopic Multiple Living  3 2 2       2     # Outcome Date GA Lbr Len/2nd Weight Sex Delivery Anes PTL Lv  3 Gravida           2 Term           1 Term                ROS: A ROS was performed and pertinent positives and negatives are included in the history.  GENERAL: No fevers or chills. HEENT: No change in vision, no earache, sore throat or sinus congestion. NECK: No pain or stiffness. CARDIOVASCULAR: No chest pain or pressure. No palpitations. PULMONARY: No shortness of breath, cough or wheeze. GASTROINTESTINAL: No abdominal pain, nausea, vomiting or diarrhea, melena or bright red blood per rectum. GENITOURINARY: No urinary frequency, urgency, hesitancy or dysuria. MUSCULOSKELETAL: No joint or muscle pain, no back pain, no recent trauma. DERMATOLOGIC: No rash, no itching, no lesions. ENDOCRINE: No polyuria, polydipsia, no heat  or cold intolerance. No recent change in weight. HEMATOLOGICAL: No anemia or easy bruising or bleeding. NEUROLOGIC: No headache, seizures, numbness, tingling or weakness. PSYCHIATRIC: No depression, no loss of interest in normal activity or change in sleep pattern.     Exam: chaperone present  BP 124/86 mmHg  Ht 5' 3.5" (1.613 m)  Wt 152 lb (68.947 kg)  BMI 26.50 kg/m2  LMP 05/23/2016  Body mass index is 26.5 kg/(m^2).  General appearance : Well developed well nourished  female. No acute distress HEENT: Eyes: no retinal hemorrhage or exudates, right eye ptosis  Neck supple, trachea midline, no carotid bruits, no thyroidmegaly Lungs: Clear to auscultation, no rhonchi or wheezes, or rib retractions  Heart: Regular rate and rhythm, no murmurs or gallops Breast:Examined in sitting and supine position were symmetrical in appearance, no palpable masses or tenderness,  no skin retraction, no nipple inversion, no nipple discharge, no skin discoloration, no axillary or supraclavicular lymphadenopathy Abdomen: no palpable masses or tenderness, no rebound or guarding Extremities: no edema or skin discoloration or tenderness  Pelvic:  Bartholin, Urethra, Skene Glands: Within normal limits             Vagina: No gross lesions or discharge  Cervix: No gross lesions or discharge  Uterus  anteverted, normal size, shape and consistency, non-tender and mobile  Adnexa  Without masses or tenderness  Anus and perineum  normal   Rectovaginal  normal sphincter tone without palpated masses or tenderness             Hemoccult  not indicated     Assessment/Plan:  35 y.o. female for annual exam  with history of right brain stem cerebellar peduncle lesion will be referred back to the [facility] brain tumor clinic as had been recommended by the physician at [facility] Dr. Eden Emms. We'll make arrangements for her to be seen. Patient with spontaneous miscarriage early this month and did not seek medical attention. We are going to check her blood type and Rh and schedule an ultrasound next week to see if she has no retaining products of conception. Since she's had history vitamin D deficiency in the past we are going to check her vitamin D level today along with the following fasting blood work: Fasting lipid profile, comprehensive metabolic panel, TSH, CBC, and urinalysis.  15 minutes additional time beside her annual exam was undertaken to review her past medical history  as well as to go through care everywhere [facility] to review pass notes and recommendation.   Terrance Mass MD, 10:27 AM 06/09/2016

## 2016-06-09 NOTE — Patient Instructions (Signed)
Aborto espontneo  (Miscarriage) El aborto espontneo es la prdida de un beb que no ha nacido (feto) antes de la semana 20 del Shelly Greene. La mayor parte de estos abortos ocurre en los primeros 3 meses. En algunos casos ocurre antes de que la mujer sepa que est [place]. Tambin se denomina "aborto espontneo" o "prdida prematura del embarazo". El aborto espontneo puede ser Shelly Greene experiencia que afecte emocionalmente a Shelly Greene, Shelly Greene. Converse con su mdico si tiene dudas, cmo es el proceso de Shelly Greene, y sobre planes futuros de Shelly Greene.  CAUSAS   Algunos problemas cromosmicos pueden hacer imposible que el beb se desarrolle normalmente. Los problemas con los genes o cromosomas del beb son generalmente el resultado de errores que se producen, por casualidad, cuando el embrin se divide y crece. Estos problemas no se heredan de los Shelly Greene.  Infeccin en el cuello del tero.   Problemas hormonales.   Problemas en el cuello del tero, como tener un tero incompetente. Esto ocurre cuando los tejidos no son lo suficientemente fuertes como para Shelly Greene.   Problemas del tero, como un tero con forma anormal, los fibromas o anormalidades congnitas.   Ciertas enfermedades crnicas.   No fume, no beba alcohol, ni consuma drogas.   Traumatismos  A veces, la causa es desconocida.  SNTOMAS   Sangrado o manchado vaginal, con o sin clicos o dolor.  Dolor o clicos en el abdomen o en la cintura.  Eliminacin de lquido, tejidos o cogulos grandes por la vagina. DIAGNSTICO  El [employer] har un examen fsico. Tambin le indicar una ecografa para confirmar el aborto. Es posible que se realicen anlisis de Shelly Greene.  TRATAMIENTO   En algunos casos el tratamiento no es necesario, si se eliminan naturalmente todos los tejidos embrionarios que se encontraban en el tero. Si el feto o la placenta quedan dentro del tero (aborto incompleto), pueden infectarse, los tejidos que quedan  pueden infectarse y deben retirarse. Generalmente se realiza un procedimiento de dilatacin y curetaje (D y C). Durante el procedimiento de dilatacin y curetaje, el cuello del tero se abre (dilata) y se retira cualquier resto de tejido fetal o placentario del tero.  Si hay una infeccin, le recetarn antibiticos. Podrn recetarle otros medicamentos para reducir el tamao del tero (contraerlo) si hay una mucho sangrado.  Si su sangre es Rh negativa y su beb es Rh positivo, usted necesitar la inyeccin de inmunoglobulina Rh. Esta inyeccin proteger a los futuros bebs de tener problemas de compatibilidad Rh en futuros embarazos. INSTRUCCIONES PARA EL CUIDADO EN EL HOGAR   El mdico le indicar reposo en cama o le permitir Automotive engineer. Vuelva a la actividad lentamente o segn las indicaciones de su mdico.  Pdale a alguien que la ayude con las responsabilidades familiares y del hogar durante este tiempo.   Lleve un registro de la cantidad y la saturacin de las toallas higinicas que Medical laboratory scientific officer. Anote esta informacin   No use tampones. No No se haga duchas vaginales ni tenga relaciones sexuales hasta que el mdico la autorice.   Slo tome medicamentos de venta libre o recetados para Glass blower/designer o Health and safety inspector, segn las indicaciones de su mdico.   No tome aspirina. La aspirina puede ocasionar hemorragias.   Concurra puntualmente a las citas de control con el mdico.   Si usted o su pareja tienen dificultades con el duelo, hable con su mdico para buscar la ayuda psicolgica que los ayude a enfrentar la prdida  del embarazo. Permtase el tiempo suficiente de duelo antes de quedar embarazada nuevamente.  SOLICITE ATENCIN MDICA DE INMEDIATO SI:   Siente calambres intensos o dolor en la espalda o en el abdomen.  Tiene fiebre.  Elimina grandes cogulos de [place] (del tamao de una nuez o ms) o tejidos por la vagina. Guarde lo que ha eliminado para  que su mdico lo examine.   La hemorragia aumenta.   Margette Fast secrecin vaginal espesa y con mal olor.  Se siente mareada, dbil, o se desmaya.   Siente escalofros.  ASEGRESE DE QUE:   Comprende estas instrucciones.  Controlar su enfermedad.  Solicitar ayuda de inmediato si no mejora o si empeora.   Esta informacin no tiene Marine scientist el consejo del mdico. Asegrese de hacerle al mdico cualquier pregunta que tenga.   Document Released: 08/17/2005 Document Revised: 03/04/2013 Elsevier Interactive Patient Education [employer].

## 2016-06-10 ENCOUNTER — Encounter: Payer: Self-pay | Admitting: Gynecology

## 2016-06-10 ENCOUNTER — Other Ambulatory Visit: Payer: Self-pay | Admitting: Gynecology

## 2016-06-10 DIAGNOSIS — E559 Vitamin D deficiency, unspecified: Secondary | ICD-10-CM

## 2016-06-10 LAB — URINALYSIS W MICROSCOPIC + REFLEX CULTURE
BILIRUBIN URINE: NEGATIVE
Bacteria, UA: NONE SEEN [HPF]
CASTS: NONE SEEN [LPF]
Crystals: NONE SEEN [HPF]
GLUCOSE, UA: NEGATIVE
HGB URINE DIPSTICK: NEGATIVE
KETONES UR: NEGATIVE
LEUKOCYTES UA: NEGATIVE
Nitrite: NEGATIVE
Protein, ur: NEGATIVE
RBC / HPF: NONE SEEN RBC/HPF (ref <=2)
SPECIFIC GRAVITY, URINE: 1.01 (ref 1.001–1.035)
SQUAMOUS EPITHELIAL / LPF: NONE SEEN [HPF] (ref <=5)
WBC, UA: NONE SEEN WBC/HPF (ref <=5)
Yeast: NONE SEEN [HPF]
pH: 6 (ref 5.0–8.0)

## 2016-06-10 LAB — VITAMIN D 25 HYDROXY (VIT D DEFICIENCY, FRACTURES): Vit D, 25-Hydroxy: 24 ng/mL — ABNORMAL LOW (ref 30–100)

## 2016-06-10 MED ORDER — VITAMIN D (ERGOCALCIFEROL) 1.25 MG (50000 UNIT) PO CAPS: 50000.0000 [IU] | ORAL_CAPSULE | ORAL | Status: DC

## 2016-06-13 ENCOUNTER — Telehealth: Payer: Self-pay

## 2016-06-13 ENCOUNTER — Other Ambulatory Visit: Payer: Self-pay

## 2016-06-13 NOTE — Telephone Encounter (Signed)
Patient called this morning stating that her Vit D Rx was not at pharmacy.  I did show receipt confirmed electronically so I called the pharmacy. They did receive the prescription but relayed to me that it was too early to fill as it showed she received it on 05/20/16.  I asked him if he could tell me who had prescribed it since not Dr. Moshe Salisbury.  He could not. He said it was not filled at [place] therefore he had no information. He said when they ran Rx through that was response from ins co. He said patient should call ins co.  I relayed all of this to patient in her voice mail.

## 2016-09-01 ENCOUNTER — Other Ambulatory Visit: Payer: Self-pay

## 2016-09-01 DIAGNOSIS — E559 Vitamin D deficiency, unspecified: Secondary | ICD-10-CM

## 2016-09-02 LAB — VITAMIN D 25 HYDROXY (VIT D DEFICIENCY, FRACTURES): Vit D, 25-Hydroxy: 44 ng/mL (ref 30–100)

## 2016-10-14 ENCOUNTER — Inpatient Hospital Stay (HOSPITAL_COMMUNITY): Payer: Self-pay

## 2016-10-14 ENCOUNTER — Inpatient Hospital Stay (HOSPITAL_COMMUNITY)
Admission: AD | Admit: 2016-10-14 | Discharge: 2016-10-14 | Disposition: A | Discharge status: Home / Self Care | Payer: Self-pay | Source: Ambulatory Visit | Attending: Obstetrics and Gynecology | Admitting: Obstetrics and Gynecology

## 2016-10-14 ENCOUNTER — Encounter (HOSPITAL_COMMUNITY): Payer: Self-pay | Admitting: *Deleted

## 2016-10-14 DIAGNOSIS — Z8042 Family history of malignant neoplasm of prostate: Secondary | ICD-10-CM | POA: Insufficient documentation

## 2016-10-14 DIAGNOSIS — N939 Abnormal uterine and vaginal bleeding, unspecified: Secondary | ICD-10-CM

## 2016-10-14 DIAGNOSIS — E559 Vitamin D deficiency, unspecified: Secondary | ICD-10-CM | POA: Insufficient documentation

## 2016-10-14 DIAGNOSIS — E039 Hypothyroidism, unspecified: Secondary | ICD-10-CM | POA: Insufficient documentation

## 2016-10-14 DIAGNOSIS — O26851 Spotting complicating pregnancy, first trimester: Secondary | ICD-10-CM | POA: Insufficient documentation

## 2016-10-14 DIAGNOSIS — Z3A01 Less than 8 weeks gestation of pregnancy: Secondary | ICD-10-CM | POA: Insufficient documentation

## 2016-10-14 DIAGNOSIS — N8311 Corpus luteum cyst of right ovary: Secondary | ICD-10-CM | POA: Insufficient documentation

## 2016-10-14 DIAGNOSIS — Z3201 Encounter for pregnancy test, result positive: Secondary | ICD-10-CM | POA: Insufficient documentation

## 2016-10-14 DIAGNOSIS — R109 Unspecified abdominal pain: Secondary | ICD-10-CM | POA: Insufficient documentation

## 2016-10-14 DIAGNOSIS — G939 Disorder of brain, unspecified: Secondary | ICD-10-CM | POA: Insufficient documentation

## 2016-10-14 LAB — CBC WITH DIFFERENTIAL/PLATELET
Basophils Absolute: 0 10*3/uL (ref 0.0–0.1)
Basophils Relative: 0 %
Eosinophils Absolute: 0.1 10*3/uL (ref 0.0–0.7)
Eosinophils Relative: 1 %
HEMATOCRIT: 35.2 % — AB (ref 36.0–46.0)
HEMOGLOBIN: 12.2 g/dL (ref 12.0–15.0)
LYMPHS ABS: 2.5 10*3/uL (ref 0.7–4.0)
LYMPHS PCT: 28 %
MCH: 29.8 pg (ref 26.0–34.0)
MCHC: 34.7 g/dL (ref 30.0–36.0)
MCV: 85.9 fL (ref 78.0–100.0)
MONOS PCT: 5 %
Monocytes Absolute: 0.4 10*3/uL (ref 0.1–1.0)
Neutro Abs: 5.6 10*3/uL (ref 1.7–7.7)
Neutrophils Relative %: 66 %
Platelets: 188 10*3/uL (ref 150–400)
RBC: 4.1 MIL/uL (ref 3.87–5.11)
RDW: 12.9 % (ref 11.5–15.5)
WBC: 8.6 10*3/uL (ref 4.0–10.5)

## 2016-10-14 LAB — URINALYSIS, ROUTINE W REFLEX MICROSCOPIC
BILIRUBIN URINE: NEGATIVE
GLUCOSE, UA: NEGATIVE mg/dL
KETONES UR: NEGATIVE mg/dL
Nitrite: NEGATIVE
PROTEIN: NEGATIVE mg/dL
Specific Gravity, Urine: >1.03 — ABNORMAL HIGH (ref 1.005–1.030)
pH: 6 (ref 5.0–8.0)

## 2016-10-14 LAB — URINE MICROSCOPIC-ADD ON

## 2016-10-14 LAB — HCG, QUANTITATIVE, PREGNANCY: hCG, Beta Chain, Quant, S: 37698 m[IU]/mL — ABNORMAL HIGH (ref <5)

## 2016-10-14 LAB — WET PREP, GENITAL
CLUE CELLS WET PREP: NONE SEEN
Sperm: NONE SEEN
TRICH WET PREP: NONE SEEN
Yeast Wet Prep HPF POC: NONE SEEN

## 2016-10-14 LAB — ABO/RH: ABO/RH(D): B POS

## 2016-10-14 LAB — POCT PREGNANCY, URINE: Preg Test, Ur: POSITIVE — AB

## 2016-10-14 NOTE — MAU Note (Signed)
Pt had pos HPT on 11/9, has been spotting ever since, has hx of miscarriages.  Has breast soreness, nausea.  Had lower abd cramping yesterday, none today.  Spotting continues.

## 2016-10-14 NOTE — MAU Provider Note (Signed)
History    Patient Shelly Greene is a [id number] here with complaints of spotting and cramping. The spotting started around Nov 7, and has been off and on. She initially thought it was her period but then took a pregnancy test which was positive on 09/30/2016.  Today she had some light cramping and so came in to get checked out.  Patient initially stated she was a patient of Dr. Harrington Challenger but she is actually a patient of  [facility] with Dr. Toney Rakes.   CSN: [id number]  Arrival date and time: 10/14/16 1342   None     Chief Complaint  Patient presents with  . Vaginal Bleeding   Vaginal Bleeding  The patient's primary symptoms include vaginal bleeding. Primary symptoms comment: Has been having spotting since early November, when she thought she was getting her period.  She had a positive pregnancy test on  09/30/2016.  Some spotting is bright red and some spotting is dark brown. . The current episode started 1 to 4 weeks ago. The problem occurs 2 to 4 times per day. The problem has been unchanged. She is pregnant. Associated symptoms include back pain. Pertinent negatives include no abdominal pain, anorexia, dysuria, fever, flank pain, frequency or headaches. The vaginal bleeding is spotting. She has been passing clots. Nothing aggravates the symptoms. She is sexually active. She uses nothing for contraception. Her menstrual history has been regular. Her past medical history is significant for miscarriage and vaginosis. There is no history of an abdominal surgery, an ectopic pregnancy, ovarian cysts or an STD.  Abdominal Pain  This is a new problem. The current episode started yesterday. The onset quality is sudden. The pain is located in the suprapubic region. The pain is moderate. The quality of the pain is cramping. The abdominal pain radiates to the back. Pertinent negatives include no anorexia, dysuria, fever, frequency or headaches. Nothing aggravates the pain. The pain is relieved  by being still. She has tried nothing for the symptoms. There is no history of abdominal surgery, colon cancer, Crohn's disease, irritable bowel syndrome or PUD.    OB History    Gravida Para Term Preterm AB Living   5 2 2   2 2    SAB TAB Ectopic Multiple Live Births   2       2      Past Medical History:  Diagnosis Date  . Brain stem lesion    Being followed [facility] Neuro[facility]  . Gestational diabetes   . Hypothyroidism   . Vitamin D deficiency     Past Surgical History:  Procedure Laterality Date  . DILATION AND EVACUATION N/A 02/13/2015   Procedure: DILATATION AND EVACUATION;  Surgeon: Truett Mainland, DO;  Location: [facility];  Service: Gynecology;  Laterality: N/A;  . EYE SURGERY      Family History  Problem Relation Age of Onset  . Prostate cancer Father     Social History  Substance Use Topics  . Smoking status: Never Smoker  . Smokeless tobacco: Never Used  . Alcohol use No    Allergies: No Known Allergies  Prescriptions Prior to Admission  Medication Sig Dispense Refill Last Dose  . Cholecalciferol (VITAMIN D3) 3000 units TABS Take 1 tablet by mouth every morning.   10/13/2016 at Unknown time  . levothyroxine (SYNTHROID, LEVOTHROID) 50 MCG tablet Take 1 tablet (50 mcg total) by mouth daily before breakfast. 30 tablet 11 10/14/2016 at Unknown time  . Prenatal Vit-Fe Fumarate-FA (PRENATAL MULTIVITAMIN)  TABS tablet Take 1 tablet by mouth daily at 12 noon.   10/13/2016 at Unknown time  . levothyroxine (SYNTHROID, LEVOTHROID) 50 MCG tablet TAKE ONE TABLET BY MOUTH ONCE DAILY BEFORE  BREAKFAST (Patient not taking: Reported on 10/14/2016) 30 tablet 2 Not Taking at Unknown time  . Vitamin D, Ergocalciferol, (DRISDOL) 50000 units CAPS capsule Take 1 capsule (50,000 Units total) by mouth every 7 (seven) days. (Patient not taking: Reported on 10/14/2016) 12 capsule 0 Not Taking at Unknown time    Review of Systems  Constitutional: Negative for fever.   HENT: Negative.   Eyes: Negative.   Respiratory: Negative.   Cardiovascular: Negative.   Gastrointestinal: Negative.  Negative for abdominal pain and anorexia.  Genitourinary: Positive for vaginal bleeding. Negative for dysuria, flank pain and frequency.  Musculoskeletal: Positive for back pain.  Skin: Negative.   Neurological: Positive for dizziness. Negative for headaches.       Dizziness from her brain lesion   Physical Exam   Blood pressure 121/68, pulse 80, temperature 98.2 F (36.8 C), temperature source Oral, resp. rate 18, height 5\' 3"  (1.6 m), weight 73.9 kg (163 lb), last menstrual period 08/27/2016, unknown if currently breastfeeding.  Physical Exam  Constitutional: She is oriented to person, place, and time. She appears well-developed and well-nourished. No distress.  Neck: Normal range of motion.  Respiratory: Effort normal. No respiratory distress.  GI: Soft. She exhibits no distension and no mass. There is no tenderness. There is no rebound and no guarding.  Genitourinary: Vagina normal and uterus normal.  Genitourinary Comments: External genitalia normal with no lesions or erythema. Vaginal walls pink with no lesions. Cervix shows some irritation and redness; no CMT on exam. No blood visualized in the vaginal vault.   Musculoskeletal: Normal range of motion.  Neurological: She is alert and oriented to person, place, and time.  Skin: Skin is warm and dry. She is not diaphoretic.  Psychiatric: She has a normal mood and affect.    MAU Course  Procedures  MDM -CBC, ABO, Beta -[place]: see report -UA=normal -UPT: positive  -GC CT: pending  -wet prep: normal   Assessment and Plan  SIUP at 6 weeks and 6 days with complaints of spotting; no subchorionic hemorrhage visualized. Beta is 37,000.  Cervix shows some irritation; GC CT cultures pending.  SIUP and yolk sac visible; embryo not visible yet.  Reviewed patient's findings with Dr. Talbert Nan; patient to call on 11/27 and  schedule a follow-up US and Beta with her provider.   Mervyn Skeeters Alizandra Loh CNM  10/14/2016, 7:01 PM

## 2016-10-17 ENCOUNTER — Telehealth: Payer: Self-pay | Admitting: *Deleted

## 2016-10-17 ENCOUNTER — Other Ambulatory Visit: Payer: Self-pay

## 2016-10-17 DIAGNOSIS — Z3A01 Less than 8 weeks gestation of pregnancy: Secondary | ICD-10-CM

## 2016-10-17 NOTE — Telephone Encounter (Signed)
Pt [facility] 10/14/16 due to vaginal bleeding, per ER notes pt is [redacted] weeks pregnant. Told to call and schedule ultrasound with you. Okay to schedule? Please advise

## 2016-10-17 NOTE — Telephone Encounter (Signed)
Orders placed, will have front desk scheduled.

## 2016-10-17 NOTE — Telephone Encounter (Signed)
She will need a quantitative beta-hCG drawn today or tomorrow with a follow-up ultrasound on Thursday

## 2016-10-17 NOTE — Telephone Encounter (Signed)
Lab scheduled for 10/17/16 and ultrasound on 10/20/16

## 2016-10-18 LAB — HCG, QUANTITATIVE, PREGNANCY: hCG, Beta Chain, Quant, S: 51186.9 m[IU]/mL — ABNORMAL HIGH

## 2016-10-19 LAB — GC/CHLAMYDIA PROBE AMP (~~LOC~~) NOT AT ARMC
CHLAMYDIA, DNA PROBE: NEGATIVE
NEISSERIA GONORRHEA: NEGATIVE

## 2016-10-20 ENCOUNTER — Encounter: Payer: Self-pay | Admitting: Gynecology

## 2016-10-20 ENCOUNTER — Other Ambulatory Visit: Payer: Self-pay | Admitting: Gynecology

## 2016-10-20 ENCOUNTER — Ambulatory Visit (INDEPENDENT_AMBULATORY_CARE_PROVIDER_SITE_OTHER): Payer: Self-pay | Admitting: Gynecology

## 2016-10-20 ENCOUNTER — Ambulatory Visit (INDEPENDENT_AMBULATORY_CARE_PROVIDER_SITE_OTHER): Payer: Self-pay

## 2016-10-20 VITALS — BP 120/78 | Ht 63.0 in | Wt 152.0 lb

## 2016-10-20 DIAGNOSIS — Z3A01 Less than 8 weeks gestation of pregnancy: Secondary | ICD-10-CM

## 2016-10-20 DIAGNOSIS — O26851 Spotting complicating pregnancy, first trimester: Secondary | ICD-10-CM

## 2016-10-20 DIAGNOSIS — O200 Threatened abortion: Secondary | ICD-10-CM

## 2016-10-20 DIAGNOSIS — Z3491 Encounter for supervision of normal pregnancy, unspecified, first trimester: Secondary | ICD-10-CM

## 2016-10-20 DIAGNOSIS — Z8639 Personal history of other endocrine, nutritional and metabolic disease: Secondary | ICD-10-CM

## 2016-10-20 NOTE — Patient Instructions (Signed)
Amenaza de aborto °(Threatened Miscarriage) °La amenaza de aborto se produce cuando hay hemorragia vaginal durante las primeras 20 semanas de embarazo, pero el embarazo no se interrumpe. Si durante este período usted tiene hemorragia vaginal, el médico le hará pruebas para asegurarse de que el embarazo continúe. Si las pruebas muestran que usted continúa embarazada y que el "bebé" en desarrollo (feto) dentro del útero sigue creciendo, se considera que tuvo una amenaza de aborto. °La amenaza de aborto no implica que el embarazo vaya a terminar, pero sí aumenta el riesgo de perder el embarazo (aborto completo). °CAUSAS °Por lo general, no se conoce la causa de la amenaza de aborto. Si el resultado final es el aborto completo, la causa más frecuente es la cantidad anormal de cromosomas del feto. Los cromosomas son las estructuras internas de las células que contienen todo el material genético. °Algunas de las causas de hemorragia vaginal que no ocasionan un aborto incluyen: °· Las relaciones sexuales. °· Las infecciones. °· Los cambios hormonales normales durante el embarazo. °· La hemorragia que se produce cuando el óvulo se implanta en el útero. °FACTORES DE RIESGO °Los factores de riesgo de hemorragia al principio del embarazo incluyen: °· Obesidad. °· Fumar. °· El consumo de cantidades excesivas de alcohol o cafeína. °· El consumo de drogas. °SIGNOS Y SÍNTOMAS °· Hemorragia vaginal leve. °· Dolor o cólicos abdominales leves. °DIAGNÓSTICO °Si tiene hemorragia con o sin dolor abdominal antes de las 20 semanas de embarazo, el médico le hará pruebas para determinar si el embarazo continúa. Una prueba importante incluye el uso de ondas sonoras y de una computadora (ecografía) para crear imágenes del interior del útero. Otras pruebas incluyen el examen interno de la vagina y el útero (examen pélvico), y el control de la frecuencia cardíaca del feto. °Es posible que le diagnostiquen una amenaza de aborto en los siguientes  casos: °· La ecografía muestra que el embarazo continúa. °· La frecuencia cardíaca del feto es alta. °· El examen pélvico muestra que la apertura entre el útero y la vagina (cuello del útero) está cerrada. °· Su frecuencia cardíaca y su presión arterial están estables. °· Los análisis de sangre confirman que el embarazo continúa. °TRATAMIENTO °No se ha demostrado que ningún tratamiento evite que una amenaza de aborto se convierta en un aborto completo. Sin embargo, los cuidados adecuados en el hogar son importantes. °INSTRUCCIONES PARA EL CUIDADO EN EL HOGAR °· Asegúrese de asistir a todas las citas de cuidados prenatales. Esto es muy importante. °· Descanse lo suficiente. °· No tenga relaciones sexuales ni use tampones si tiene hemorragia vaginal. °· No se haga duchas vaginales. °· No fume ni consuma drogas. °· No beba alcohol. °· Evite la cafeína. °SOLICITE ATENCIÓN MÉDICA SI: °· Tiene una ligera hemorragia o manchado vaginal durante el embarazo. °· Tiene dolor o cólicos en el abdomen. °· Tiene fiebre. °SOLICITE ATENCIÓN MÉDICA DE INMEDIATO SI: °· Tiene una hemorragia vaginal abundante. °· Elimina coágulos de sangre por la vagina. °· Siente dolor en la parte baja de la espalda o cólicos abdominales intensos. °· Tiene fiebre, escalofríos y dolor abdominal intenso. °ASEGÚRESE DE QUE: °· Comprende estas instrucciones. °· Controlará su afección. °· Recibirá ayuda de inmediato si no mejora o si empeora. °Esta información no tiene como fin reemplazar el consejo del médico. Asegúrese de hacerle al médico cualquier pregunta que tenga. °Document Released: 08/17/2005 Document Revised: 11/12/2013 Document Reviewed: 09/03/2013 °Elsevier Interactive Patient Education © 2017 Elsevier Inc. ° °

## 2016-10-20 NOTE — Progress Notes (Signed)
Patient is a 35 year old gravida 5 para 2022 who presented to the office today for follow-up ultrasound. She had gone to the emergency room at [facility] hospital past weekend complaints of spotting and cramping she did not know she was pregnant. She reported that her last menstrual period was normal October 7 she is not bleeding or having any complaints today. Patient had an ultrasound as well as a quantitative beta-hCG in the emergency room the ultrasound report was as follows:  Subchorionic hemorrhage:  None visualized.  Maternal uterus/adnexae: Uterus is grossly unremarkable. The ovaries are within normal limits. The left ovary measures 2 x 3.4 x 1.4 cm. The right ovary measures 3.9 x 3.1 by 3.2 cm. There is a 3 cm corpus luteal cyst in the right ovary.  IMPRESSION: 1. A single intrauterine gestational sac with a small yolk sac is visualized. No fetal pole identified. Recommend follow-up quantitative B-HCG levels and follow-up US in 14 days to assess viability. 2. 3 cm corpus luteal cyst in the right ovary.   Ultrasound here in the office demonstrated the following: Intrauterine pregnancy consistent with 7 weeks and estimated date of confinement 06/08/2017. Cardiac activity at 136 beats per minute were noted. Cervix long closed. No subchorionic hemorrhage noted. A thin walled right then echo-free cyst measuring 31 x 22 mm negative color flow was noted. Some fluid in the cul-de-sac was noted left ovary otherwise normal.  Blood type in the emergency room was recorded B+ and her quantitative beta-hCGs have been as noted: Results for BRANDIS, CASTEEL (MRN [id number]) as of 10/20/2016 14:56  Ref. 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 Last MRI 11/29/12 is stable compared to 01/19/12. There remains a contrast enhancing lesion mostly centered in the right middle cerebellar peduncle. 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 Imaging most consistent with benign neoplasm. Biopsy or resection very high risk, so will continue observation for now. If symptoms become worrisome, may consider steroids in future, or resection."  MRI scan May 2015 They had reported that the right brain stem cerebellar peduncle lesion was stable with no change from previous scan of 2013. They had reported that because of the location of this lesion it was not recommended to proceed with resection or other treatments at the time. They did recommend follow-up MRI in one year.  I had reviewed the notes from Dr. Eden Emms who was the physician that saw her at [facility] back in 2015 in his note indicated that she was going to be referred to the [facility] brain tumor clinic and patient was awaiting further call and never followed up. Since that time she has been complaining of some pressure in her right ear and a drop of her right eyelid. She also has occasional imbalance and also no headaches but suffers from vertigo.  She has not followed up with [facility].  Assessment/plan: Viable intrauterine pregnancy at [redacted] weeks gestation with due date 06/08/2017. Patient asymptomatic today no further bleeding reported. Threatened AB precautions provided in Spanish. We'll referred patient to our colleagues at the  faculty service at [facility] hospital to manage her high-risk obstetrics. Patient was instructed to stop her high dose vitamin D for vitamin D deficiency and continue prenatal vitamins along with her  Synthroid.

## 2016-11-08 ENCOUNTER — Ambulatory Visit (INDEPENDENT_AMBULATORY_CARE_PROVIDER_SITE_OTHER): Payer: 59 | Admitting: Obstetrics & Gynecology

## 2016-11-08 ENCOUNTER — Encounter: Payer: Self-pay | Admitting: Obstetrics & Gynecology

## 2016-11-08 VITALS — BP 107/72 | HR 87 | Wt 159.0 lb

## 2016-11-08 DIAGNOSIS — E039 Hypothyroidism, unspecified: Secondary | ICD-10-CM

## 2016-11-08 DIAGNOSIS — Z113 Encounter for screening for infections with a predominantly sexual mode of transmission: Secondary | ICD-10-CM

## 2016-11-08 DIAGNOSIS — O09529 Supervision of elderly multigravida, unspecified trimester: Secondary | ICD-10-CM | POA: Insufficient documentation

## 2016-11-08 DIAGNOSIS — Z3481 Encounter for supervision of other normal pregnancy, first trimester: Secondary | ICD-10-CM

## 2016-11-08 DIAGNOSIS — O99281 Endocrine, nutritional and metabolic diseases complicating pregnancy, first trimester: Secondary | ICD-10-CM

## 2016-11-08 DIAGNOSIS — O09521 Supervision of elderly multigravida, first trimester: Secondary | ICD-10-CM

## 2016-11-08 DIAGNOSIS — O99280 Endocrine, nutritional and metabolic diseases complicating pregnancy, unspecified trimester: Secondary | ICD-10-CM

## 2016-11-08 DIAGNOSIS — O0992 Supervision of high risk pregnancy, unspecified, second trimester: Secondary | ICD-10-CM | POA: Insufficient documentation

## 2016-11-08 DIAGNOSIS — Z8632 Personal history of gestational diabetes: Secondary | ICD-10-CM

## 2016-11-08 HISTORY — DX: Personal history of gestational diabetes: Z86.32

## 2016-11-08 NOTE — Patient Instructions (Signed)
Return to clinic for any scheduled appointments or obstetric concerns, or go to [facility] for evaluation  

## 2016-11-08 NOTE — Progress Notes (Signed)
Subjective:   Marcelina Mclaurin is a 35 y.o. [id number] at 5w3dby LMP being seen today for her first obstetrical visit.  Her obstetrical history is significant for advanced maternal age, two prior SVDs. Patient does intend to breast feed. Pregnancy history fully reviewed.  Patient reports no complaints.  HISTORY: Obstetric History   G5   P2   T2   P0   A2   L2    SAB2   TAB0   Ectopic0   Multiple0   Live Births2     # Outcome Date GA Lbr Len/2nd Weight Sex Delivery Anes PTL Lv  5 Current           4 Term 07/10/09   7 lb 4 oz (3.289 kg) F Vag-Spont   LIV  3 Term 08/14/04   7 lb 14 oz (3.572 kg) M Vag-Spont   LIV  2 SAB           1 SAB              Past Medical History:  Diagnosis Date  . Brain stem lesion    [facility]  . Gestational diabetes   . Hypothyroidism   . Vitamin D deficiency    Past Surgical History:  Procedure Laterality Date  . DILATION AND EVACUATION N/A 02/13/2015   Procedure: DILATATION AND EVACUATION;  Surgeon: JTruett Mainland DO;  [facility];  Service: Gynecology;  Laterality: N/A;  . EYE SURGERY     Family History  Problem Relation Age of Onset  . Prostate cancer Father    Social History  Substance Use Topics  . Smoking status: Never Smoker  . Smokeless tobacco: Never Used  . Alcohol use No   No Known Allergies Current Outpatient Prescriptions on File Prior to Visit  Medication Sig Dispense Refill  . levothyroxine (SYNTHROID, LEVOTHROID) 50 MCG tablet Take 1 tablet (50 mcg total) by mouth daily before breakfast. 30 tablet 11  . Prenatal Vit-Fe Fumarate-FA (PRENATAL MULTIVITAMIN) TABS tablet Take 1 tablet by mouth daily at 12 noon.     No current facility-administered medications on file prior to visit.      Exam   Vitals:   11/08/16 1018  BP: 107/72  Pulse: 87  Weight: 159 lb (72.1 kg)   Fetal Heart Rate (bpm): 164  Uterus:     System: General: well-developed, well-nourished female  in no acute distress   Breast:  normal appearance, no masses or tenderness   Skin: normal coloration and turgor, no rashes   Neurologic: oriented, normal, negative, normal mood   Extremities: normal strength, tone, and muscle mass, ROM of all joints is normal   HEENT PERRLA, extraocular movement intact and sclera clear, anicteric   Mouth/Teeth mucous membranes moist, pharynx normal without lesions and dental hygiene good   Neck supple and no masses   Cardiovascular: regular rate and rhythm   Respiratory:  no respiratory distress, normal breath sounds   Abdomen: soft, non-tender; bowel sounds normal; no masses,  no organomegaly   Pelvic: deferred     Assessment:   Pregnancy: [id number] Active Problem List   Diagnosis Date Noted  . Hypothyroidism during pregnancy, antepartum 11/08/2016  . Supervision of normal pregnancy 11/08/2016  . History of gestational diabetes 11/08/2016  . 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 Other specified hypothyroidism 01/05/2015  . 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 PILAR CYST 02/27/2007  . OVERWEIGHT 01/18/2007  . CARPAL TUNNEL SYNDROME 01/18/2007  Plan:  1. History of gestational diabetes Will check labs today. Counseled about avoiding simple carbohydrates, weight gain, appropriate nutrition in pregnancy.  - Comp Met (CMET) - HgB A1c - Protein / creatinine ratio, urine  2. Hypothyroidism during pregnancy, antepartum Will follow up TSH. Continue Synthroid for now. - TSH  3. Elderly multigravida in first trimester Declined genetic screening due to cost, will follow up anatomy scan later.   4. Encounter for supervision of other normal pregnancy in first trimester - Prenatal Profile - Hemoglobin Eval rfx Electrophoresis - Culture, OB Urine - Pain Mgmt, Profile 6 Conf w/o mM, U - Urine cytology ancillary only  Initial labs drawn. Continue prenatal vitamins. Genetic Screening discussed, First trimester screen, Quad screen and NIPS: declined. Ultrasound  discussed; fetal anatomic survey: to be ordered later. Problem list reviewed and updated. The nature of [place] with multiple MDs and other Advanced Practice Providers was explained to patient; also emphasized that residents, students are part of our team. Routine obstetric precautions reviewed. Return in about 2 weeks (around 11/22/2016) for OB Visit (12 weeks Babyscripts).     Verita Schneiders, MD, [place] Attending [place], [facility] for [employer], [place]

## 2016-11-08 NOTE — Assessment & Plan Note (Signed)
BABYSCRIPTS PATIENT: [x ] initial, [ ] 12, [ ] 20, [ ] 28, [ ] 32, [ ] 36, [ ] 38, [ ] 39, [ ] 40 

## 2016-11-09 LAB — PRENATAL PROFILE (SOLSTAS)
ANTIBODY SCREEN: NEGATIVE
BASOS ABS: 0 {cells}/uL (ref 0–200)
Basophils Relative: 0 %
EOS ABS: 66 {cells}/uL (ref 15–500)
Eosinophils Relative: 1 %
HCT: 37 % (ref 35.0–45.0)
HIV: NONREACTIVE
Hemoglobin: 12.4 g/dL (ref 11.7–15.5)
Hepatitis B Surface Ag: NEGATIVE
LYMPHS PCT: 24 %
Lymphs Abs: 1584 cells/uL (ref 850–3900)
MCH: 29.5 pg (ref 27.0–33.0)
MCHC: 33.5 g/dL (ref 32.0–36.0)
MCV: 87.9 fL (ref 80.0–100.0)
MONOS PCT: 5 %
MPV: 11.9 fL (ref 7.5–12.5)
Monocytes Absolute: 330 cells/uL (ref 200–950)
NEUTROS PCT: 70 %
Neutro Abs: 4620 cells/uL (ref 1500–7800)
PLATELETS: 194 10*3/uL (ref 140–400)
RBC: 4.21 MIL/uL (ref 3.80–5.10)
RDW: 13.5 % (ref 11.0–15.0)
RH TYPE: POSITIVE
RUBELLA: 20 {index} — AB (ref <0.90)
WBC: 6.6 10*3/uL (ref 3.8–10.8)

## 2016-11-09 LAB — COMPREHENSIVE METABOLIC PANEL
ALK PHOS: 46 U/L (ref 33–115)
ALT: 13 U/L (ref 6–29)
AST: 19 U/L (ref 10–30)
Albumin: 3.9 g/dL (ref 3.6–5.1)
BUN: 7 mg/dL (ref 7–25)
CO2: 24 mmol/L (ref 20–31)
Calcium: 8.9 mg/dL (ref 8.6–10.2)
Chloride: 102 mmol/L (ref 98–110)
Creat: 0.4 mg/dL — ABNORMAL LOW (ref 0.50–1.10)
Glucose, Bld: 97 mg/dL (ref 65–99)
POTASSIUM: 4.2 mmol/L (ref 3.5–5.3)
SODIUM: 136 mmol/L (ref 135–146)
Total Bilirubin: 0.6 mg/dL (ref 0.2–1.2)
Total Protein: 6.4 g/dL (ref 6.1–8.1)

## 2016-11-09 LAB — TSH: TSH: 1.43 mIU/L

## 2016-11-09 LAB — PROTEIN / CREATININE RATIO, URINE
Creatinine, Urine: 100 mg/dL (ref 20–320)
Protein Creatinine Ratio: 80 mg/g creat (ref 21–161)
Total Protein, Urine: 8 mg/dL (ref 5–24)

## 2016-11-09 LAB — URINE CYTOLOGY ANCILLARY ONLY
CHLAMYDIA, DNA PROBE: NEGATIVE
Neisseria Gonorrhea: NEGATIVE

## 2016-11-09 LAB — HEMOGLOBIN A1C
Hgb A1c MFr Bld: 5 % (ref <5.7)
Mean Plasma Glucose: 97 mg/dL

## 2016-11-10 LAB — CULTURE, OB URINE: ORGANISM ID, BACTERIA: NO GROWTH

## 2016-11-10 NOTE — Addendum Note (Signed)
Addended by: Gretchen Short on: 11/10/2016 08:03 AM   Modules accepted: Orders

## 2016-11-11 LAB — PAIN MGMT, PROFILE 6 CONF W/O MM, U
6 Acetylmorphine: NEGATIVE ng/mL (ref <10)
ALCOHOL METABOLITES: NEGATIVE ng/mL (ref <500)
Amphetamines: NEGATIVE ng/mL (ref <500)
BARBITURATES: NEGATIVE ng/mL (ref <300)
BENZODIAZEPINES: NEGATIVE ng/mL (ref <100)
COCAINE METABOLITE: NEGATIVE ng/mL (ref <150)
Creatinine: 86.7 mg/dL (ref >=20.0)
MARIJUANA METABOLITE: NEGATIVE ng/mL (ref <20)
METHADONE METABOLITE: NEGATIVE ng/mL (ref <100)
OPIATES: NEGATIVE ng/mL (ref <100)
Oxidant: NEGATIVE ug/mL (ref <200)
Oxycodone: NEGATIVE ng/mL (ref <100)
PHENCYCLIDINE: NEGATIVE ng/mL (ref <25)
PLEASE NOTE: 0
pH: 7.05 (ref 4.5–9.0)

## 2016-11-11 LAB — HEMOGLOBINOPATHY EVALUATION
HCT: 37 % (ref 35.0–45.0)
HEMOGLOBIN: 12.4 g/dL (ref 11.7–15.5)
HGB A2 QUANT: 2.4 % (ref 1.8–3.5)
HGB A: 96.6 % (ref >96.0)
Hgb F Quant: <1 % (ref <2.0)
MCH: 29.5 pg (ref 27.0–33.0)
MCV: 87.9 fL (ref 80.0–100.0)
RBC: 4.21 MIL/uL (ref 3.80–5.10)
RDW: 13.5 % (ref 11.0–15.0)

## 2016-11-24 ENCOUNTER — Ambulatory Visit (INDEPENDENT_AMBULATORY_CARE_PROVIDER_SITE_OTHER): Payer: Self-pay | Admitting: Student

## 2016-11-24 VITALS — BP 106/66 | HR 81 | Wt 161.0 lb

## 2016-11-24 DIAGNOSIS — Z3481 Encounter for supervision of other normal pregnancy, first trimester: Secondary | ICD-10-CM

## 2016-11-24 DIAGNOSIS — Z8632 Personal history of gestational diabetes: Secondary | ICD-10-CM

## 2016-11-24 DIAGNOSIS — O09521 Supervision of elderly multigravida, first trimester: Secondary | ICD-10-CM

## 2016-11-24 DIAGNOSIS — O99280 Endocrine, nutritional and metabolic diseases complicating pregnancy, unspecified trimester: Secondary | ICD-10-CM

## 2016-11-24 DIAGNOSIS — E039 Hypothyroidism, unspecified: Secondary | ICD-10-CM

## 2016-11-24 NOTE — Progress Notes (Signed)
BRX Compliant

## 2016-11-24 NOTE — Patient Instructions (Signed)

## 2016-11-24 NOTE — Progress Notes (Signed)
   PRENATAL VISIT NOTE  Subjective:  Shelly Greene is a 36 y.o. [id number] at [redacted]w[redacted]d being seen today for ongoing prenatal care.  She is currently monitored for the following issues for this high-risk pregnancy and has OVERWEIGHT; CARPAL TUNNEL SYNDROME; PILAR CYST; Brain stem lesion; Other specified hypothyroidism; Ptosis; Hypothyroidism during pregnancy, antepartum; Supervision of normal pregnancy; History of gestational diabetes; and Advanced maternal age in multigravida on her problem list.  Patient reports no complaints.  Contractions: Not present. Vag. Bleeding: None.  Movement: Absent. Denies leaking of fluid.   The following portions of the patient's history were reviewed and updated as appropriate: allergies, current medications, past family history, past medical history, past social history, past surgical history and problem list. Problem list updated.  Objective:   Vitals:   11/24/16 1543  BP: 106/66  Pulse: 81  Weight: 73 kg (161 lb)    Fetal Status: Fetal Heart Rate (bpm): 166   Movement: Absent     General:  Alert, oriented and cooperative. Patient is in no acute distress.  Skin: Skin is warm and dry. No rash noted.   Cardiovascular: Normal heart rate noted  Respiratory: Normal respiratory effort, no problems with respiration noted  Abdomen: Soft, gravid, appropriate for gestational age. Pain/Pressure: Absent     Pelvic:  Cervical exam deferred        Extremities: Normal range of motion.  Edema: None  Mental Status: Normal mood and affect. Normal behavior. Normal judgment and thought content.   Assessment and Plan:  Pregnancy: [id number] at [redacted]w[redacted]d  1. Encounter for supervision of other normal pregnancy in first trimester Patient feeling well. She is enrolled in babyscripts but wants to come back in 4 weeks for a nurse visit to auscultate fetal heart rate.  - US OB Comp + 14 Wk; Future - Hemoglobin A1C - Glucose, random  2. Hypothyroidism during pregnancy,  antepartum -TSH each trimester  3. History of gestational diabetes Not currently diagnosed with gestational diabetes; waiting to see results of HgbA1c and Glucose, random  4. Elderly multigravida in first trimester   Preterm labor symptoms and general obstetric precautions including but not limited to vaginal bleeding, contractions, leaking of fluid and fetal movement were reviewed in detail with the patient. Please refer to After Visit Summary for other counseling recommendations.  Return in about 4 weeks (around 12/22/2016).   Starr Lake, CNM

## 2016-11-26 LAB — HEMOGLOBIN A1C
ESTIMATED AVERAGE GLUCOSE: 105 mg/dL
HEMOGLOBIN A1C: 5.3 % (ref 4.8–5.6)

## 2016-11-26 LAB — GLUCOSE, RANDOM: GLUCOSE: 86 mg/dL (ref 65–99)

## 2016-11-30 ENCOUNTER — Encounter: Payer: 59 | Admitting: Family Medicine

## 2016-12-12 ENCOUNTER — Telehealth: Payer: Self-pay | Admitting: Radiology

## 2016-12-12 NOTE — Telephone Encounter (Signed)
Called and spoke with pt to inform her of appointment for ultrasound at [facility] department 01/09/17 @ 10:00

## 2016-12-22 ENCOUNTER — Other Ambulatory Visit: Payer: Self-pay | Admitting: *Deleted

## 2016-12-22 NOTE — Progress Notes (Signed)
Pt is on the optimized Baby Scripts schedule, here today for a FHR check just for reassurance.  +FHR = 156.  Pt has no complaints at this time.

## 2017-01-12 NOTE — Addendum Note (Signed)
Addended by: Ricka Burdock on: 01/12/2017 02:26 PM   Modules accepted: Orders

## 2017-01-18 ENCOUNTER — Encounter: Payer: Self-pay | Admitting: Obstetrics and Gynecology

## 2017-01-18 ENCOUNTER — Ambulatory Visit (INDEPENDENT_AMBULATORY_CARE_PROVIDER_SITE_OTHER): Payer: Self-pay | Admitting: Obstetrics and Gynecology

## 2017-01-18 VITALS — BP 111/72 | HR 73 | Wt 166.0 lb

## 2017-01-18 DIAGNOSIS — O99282 Endocrine, nutritional and metabolic diseases complicating pregnancy, second trimester: Secondary | ICD-10-CM

## 2017-01-18 DIAGNOSIS — O09522 Supervision of elderly multigravida, second trimester: Secondary | ICD-10-CM

## 2017-01-18 DIAGNOSIS — E039 Hypothyroidism, unspecified: Secondary | ICD-10-CM

## 2017-01-18 DIAGNOSIS — E669 Obesity, unspecified: Secondary | ICD-10-CM | POA: Insufficient documentation

## 2017-01-18 DIAGNOSIS — O99280 Endocrine, nutritional and metabolic diseases complicating pregnancy, unspecified trimester: Principal | ICD-10-CM

## 2017-01-18 DIAGNOSIS — O99212 Obesity complicating pregnancy, second trimester: Secondary | ICD-10-CM | POA: Insufficient documentation

## 2017-01-18 DIAGNOSIS — G939 Disorder of brain, unspecified: Secondary | ICD-10-CM

## 2017-01-18 DIAGNOSIS — O0992 Supervision of high risk pregnancy, unspecified, second trimester: Secondary | ICD-10-CM

## 2017-01-18 NOTE — Progress Notes (Signed)
Prenatal Visit Note Date: 01/18/2017 Clinic: [facility]  Subjective:  Shelly Greene is a 36 y.o. [id number] at [redacted]w[redacted]d being seen today for ongoing prenatal care.  She is currently monitored for the following issues for this high-risk pregnancy and has CARPAL TUNNEL SYNDROME; PILAR CYST; Brain stem lesion; Ptosis; Hypothyroidism during pregnancy, antepartum; Supervision of high risk pregnancy in second trimester; History of gestational diabetes; Advanced maternal age in multigravida; Obesity (BMI 30.0-34.9); and Obesity affecting pregnancy in second trimester on her problem list.  Patient reports no complaints.   Contractions: Not present. Vag. Bleeding: None.  Movement: Present. Denies leaking of fluid.   The following portions of the patient's history were reviewed and updated as appropriate: allergies, current medications, past family history, past medical history, past social history, past surgical history and problem list. Problem list updated.  Objective:   Vitals:   01/18/17 0843  BP: 111/72  Pulse: 73  Weight: 166 lb (75.3 kg)    Fetal Status: Fetal Heart Rate (bpm): 150   Movement: Present     General:  Alert, oriented and cooperative. Patient is in no acute distress.  Skin: Skin is warm and dry. No rash noted.   Cardiovascular: Normal heart rate noted  Respiratory: Normal respiratory effort, no problems with respiration noted  Abdomen: Soft, gravid, appropriate for gestational age. Pain/Pressure: Absent     Pelvic:  Cervical exam deferred        Extremities: Normal range of motion.  Edema: None  Mental Status: Normal mood and affect. Normal behavior. Normal judgment and thought content.   Urinalysis:      Assessment and Plan:  Pregnancy: [id number] at [redacted]w[redacted]d  1. Hypothyroidism during pregnancy, antepartum Continue synthroid 50 qday. Recheck TSH at 28wk labs  2. Elderly multigravida in second trimester Declined GC. D/w pt re: BC nv  3. Brain stem  lesion Last seen by [facility] at [facility] in 2015 and were just doing yearly MRIs to ensure stability. Pt feeling tired but hard to say if from pregnancy vs lesion. Refer to NSU for evaluation and also will need anesthesia c/s in early third trimester too.  - Ambulatory referral to Neurosurgery  4. Supervision of high risk pregnancy in second trimester Routine care. Normal anatomy scan.   Preterm labor symptoms and general obstetric precautions including but not limited to vaginal bleeding, contractions, leaking of fluid and fetal movement were reviewed in detail with the patient. Please refer to After Visit Summary for other counseling recommendations.  Return in about 8 weeks (around 03/15/2017) for 2hr GTT and ROB.   Aletha Halim, MD

## 2017-02-08 ENCOUNTER — Encounter: Payer: Self-pay | Admitting: *Deleted

## 2017-03-15 ENCOUNTER — Encounter: Payer: Self-pay | Admitting: Obstetrics & Gynecology

## 2017-03-15 ENCOUNTER — Ambulatory Visit (INDEPENDENT_AMBULATORY_CARE_PROVIDER_SITE_OTHER): Payer: Self-pay | Admitting: Obstetrics & Gynecology

## 2017-03-15 VITALS — BP 104/69 | HR 75 | Wt 170.0 lb

## 2017-03-15 DIAGNOSIS — O99283 Endocrine, nutritional and metabolic diseases complicating pregnancy, third trimester: Secondary | ICD-10-CM

## 2017-03-15 DIAGNOSIS — G939 Disorder of brain, unspecified: Secondary | ICD-10-CM

## 2017-03-15 DIAGNOSIS — Z3483 Encounter for supervision of other normal pregnancy, third trimester: Secondary | ICD-10-CM

## 2017-03-15 DIAGNOSIS — O09523 Supervision of elderly multigravida, third trimester: Secondary | ICD-10-CM

## 2017-03-15 DIAGNOSIS — E039 Hypothyroidism, unspecified: Secondary | ICD-10-CM

## 2017-03-15 DIAGNOSIS — O09529 Supervision of elderly multigravida, unspecified trimester: Secondary | ICD-10-CM

## 2017-03-15 DIAGNOSIS — O99280 Endocrine, nutritional and metabolic diseases complicating pregnancy, unspecified trimester: Secondary | ICD-10-CM

## 2017-03-15 NOTE — Progress Notes (Signed)
   PRENATAL VISIT NOTE  Subjective:  Shelly Greene is a 36 y.o. [id number] at 12w4dbeing seen today for ongoing prenatal care.  She is currently monitored for the following issues for this low-risk pregnancy and has CARPAL TUNNEL SYNDROME; Brain stem lesion; Hypothyroidism during pregnancy, antepartum; Supervision of high risk pregnancy in second trimester; History of gestational diabetes; Advanced maternal age in multigravida; Obesity (BMI 30.0-34.9); and Obesity affecting pregnancy in second trimester on her problem list.  Patient reports increased white-clear vaginal discharge.  No pruritus, no odor, just wanted to make sure it was normal.  Contractions: Not present. Vag. Bleeding: None.  Movement: Present. Denies leaking of fluid.   The following portions of the patient's history were reviewed and updated as appropriate: allergies, current medications, past family history, past medical history, past social history, past surgical history and problem list. Problem list updated.  Objective:   Vitals:   03/15/17 0836  BP: 104/69  Pulse: 75  Weight: 170 lb (77.1 kg)    Fetal Status: Fetal Heart Rate (bpm): 153 Fundal Height: 30 cm Movement: Present     General:  Alert, oriented and cooperative. Patient is in no acute distress.  Skin: Skin is warm and dry. No rash noted.   Cardiovascular: Normal heart rate noted  Respiratory: Normal respiratory effort, no problems with respiration noted  Abdomen: Soft, gravid, appropriate for gestational age. Pain/Pressure: Present     Pelvic:  Cervical exam deferred      Normal vaginal discharge noted.  Extremities: Normal range of motion.  Edema: None  Mental Status: Normal mood and affect. Normal behavior. Normal judgment and thought content.   Assessment and Plan:  Pregnancy: [id number] 258w4d1. Hypothyroidism during pregnancy, antepartum Will check labs today. - TSH - T3, free - T4, free - Comp Met (CMET)  2. Antepartum multigravida of  advanced maternal age  35.76Brain stem lesion Awaiting Neurosurgery appointment. Referred to Anesthesiology, talked to Dr. MiJosephine Igobout her and he recommended ordering MR of brain. This was ordered for patient, will follow up results and manage accordingly. - Ambulatory referral to Anesthesiology - [facility]  4. Encounter for supervision of other normal pregnancy in third trimester Third trimester labs today. [facility] Gl[facility] T[facility], 2 Hours w/1 Hour - CBC - RPR - HIV antibody - Tdap vaccine greater than or equal to 7yo IM Preterm labor symptoms and general obstetric precautions including but not limited to vaginal bleeding, contractions, leaking of fluid and fetal movement were reviewed in detail with the patient. Please refer to After Visit Summary for other counseling recommendations.  Return in about 4 weeks (around 04/12/2017) for OB 32 week visit (Babyscripts).   UgOsborne OmanMD

## 2017-03-15 NOTE — Patient Instructions (Signed)
Need Tdap -> get at [facility] Department for free  Vacuna Td (contra la difteria y el ttanos): Lo que debe saber (Td Vaccine Margaretmary Eddy and Diphtheria]: What You Need to Know) 1. Por qu vacunarse? El ttanos y la difteria son enfermedades muy graves. Son Futures trader frecuentes en los [place] actualmente, pero las personas que se infectan suelen tener complicaciones graves. La vacuna Td se [place] para proteger a los adolescentes y a los adultos de ambas enfermedades. Tanto el ttanos como la difteria son infecciones causadas por bacterias. La difteria se transmite de persona a persona a travs de la tos o el estornudo. La bacteria que causa el ttanos entra al cuerpo a travs de cortes, raspones o heridas. El TTANOS (trismo) provoca entumecimiento y Writer dolorosa de los msculos, por lo general, en todo el cuerpo.  Puede causar el endurecimiento de los msculos de la cabeza y el cuello, de modo que impide abrir la boca, tragar y en algunos casos, Ambulance person. El ttanos es causa de muerte en aproximadamente 1de cada 10personas que contraen la infeccin, incluso despus de que reciben la mejor atencin mdica. La DIFTERIA puede hacer que se forme una membrana gruesa en la parte posterior de la garganta.  Puede causar problemas respiratorios, parlisis, insuficiencia cardaca e incluso la muerte. Antes de las vacunas, en los [place] se informaban 200000 casos de difteria y cientos de casos de ttanos cada ao. Desde que comenz la vacunacin, los informes de casos de ambas enfermedades se han reducido en un 99%. 2. Edward Jolly Td La vacuna Td protege a adolescentes y adultos contra el ttanos y la difteria. La vacuna Td habitualmente se aplica como dosis de refuerzo cada 10aos, pero tambin puede administrarse antes si la persona sufre una [place] o herida sucia y grave. A veces, en lugar de la vacuna Td, se recomienda una vacuna llamada Tdap, que protege contra la tosferina, adems de  proteger contra el ttanos y la difteria. El mdico o la persona que le aplique la vacuna puede darle ms informacin al Sears Holdings Corporation. La Td puede administrarse de manera segura simultneamente con otras vacunas. 3. Algunas personas no deben recibir la vacuna  Una persona que alguna vez ha tenido una reaccin alrgica potencialmente mortal a una dosis anterior de cualquier vacuna contra el ttanos o la difteria, O que tenga una alergia grave a cualquier parte de esta vacuna, no debe recibir la vacuna Td. Informe a la persona que le aplica la vacuna si usted tiene cualquier alergia grave.  Consulte con su mdico si:  tuvo hinchazn o dolor intenso despus de recibir cualquier vacuna contra la difteria o el ttanos,  alguna vez ha sufrido el sndrome de [place],  no se siente Pharmacologist en que se ha programado la vacuna. 4. [place] de [place] reaccin a la vacuna Con cualquier medicamento, incluyendo las vacunas, existe la posibilidad de que aparezcan efectos secundarios. Suelen ser leves y desaparecen por s solos. Tambin son posibles las reacciones graves, pero en raras ocasiones. [place] personas a las que se les aplica la vacuna Td no tienen ningn problema. Problemas leves despus de la vacuna Td: (No interfirieron en otras actividades)  Dolor en el lugar donde se aplic la vacuna (alrededor de 8de cada 10personas)  Enrojecimiento o hinchazn en el lugar donde se aplic la vacuna (alrededor de 1de cada 4personas)  Fiebre leve (poco frecuente)  Dolor de Pensions consultant (alrededor de 1de cada 4personas)  Cansancio (alrededor de 1de cada 4personas) Problemas moderados  despus de la vacuna Td: (Interfirieron en otras actividades, pero no requirieron atencin mdica)  El Rito superior a 102F (38,8C) (poco frecuente) Problemas graves despus de la vacuna Td: (Impidieron Optometrist las actividades habituales; requirieron atencin mdica)  Clinical cytogeneticist, dolor intenso, sangrado  o enrojecimiento en el brazo en que se aplic la vacuna (poco frecuente). Problemas que podran ocurrir despus de cualquier vacuna:  Las personas a veces se desmayan despus de un procedimiento mdico, incluida la vacunacin. Si permanece sentado o recostado durante 15 minutos puede ayudar a Merrill [employer] y las lesiones causadas por las cadas. Informe al mdico si se siente mareado, tiene cambios en la visin o zumbidos en los odos.  Algunas personas sienten un dolor intenso en el hombro y tienen dificultad para mover el brazo donde se coloc la vacuna. Esto sucede con muy poca frecuencia.  Cualquier medicamento puede causar una reaccin alrgica grave. Dichas reacciones son Orlene Erm poco frecuentes con una vacuna (se calcula que menos de 1en un milln de dosis) y se producen de unos minutos a unas horas despus de Writer. Al igual que con cualquier Halliburton Company, existe una probabilidad muy remota de que una vacuna cause una lesin grave o la Amanda. Se controla permanentemente la seguridad de las vacunas. Para obtener ms informacin, visite: http://www.aguilar.org/. 5. Qu pasa si hay una reaccin grave? A qu signos debo estar atento?  Observe todo lo que le preocupe, como signos de una reaccin alrgica grave, fiebre muy alta o comportamiento fuera de lo normal. Los signos de una reaccin alrgica grave pueden incluir ronchas, hinchazn de la cara y la garganta, dificultad para respirar, latidos cardacos acelerados, mareos y debilidad. Generalmente, estos comenzaran entre unos pocos minutos y algunas horas despus de la vacunacin. Qu debo hacer?  Si usted piensa que se trata de una reaccin alrgica grave o de otra emergencia que no puede esperar, llame al 911 o dirjase al hospital ms cercano. Sino, llame a su mdico.  Despus, la reaccin debe informarse al Sistema de Informacin sobre Efectos Adversos de las [place] (Vaccine Adverse Event Reporting System, VAERS).  Su mdico puede presentar este informe, o puede hacerlo usted mismo a travs del sitio web de VAERS, en [email or web address].SamedayNews.es, o llamando al [phone]. VAERS no brinda recomendaciones mdicas. 6. [place] Compensacin de Daos por [place] de Compensacin de Daos por Clinical biochemist (National Vaccine Injury Compensation Program, VICP) es un programa federal que fue creado para Patent examiner a las personas que puedan haber sufrido daos al recibir ciertas vacunas. Aquellas personas que consideren que han sufrido un dao como consecuencia de una vacuna y [place] saber ms acerca del programa y de cmo presentar Raechel Chute, pueden llamar al [phone] o visitar su sitio web en [email or web address].[email or web address]. Hay un lmite de tiempo para presentar un reclamo de compensacin. 7. Cmo puedo obtener ms informacin?  Consulte a su mdico. Este puede darle el prospecto de la vacuna o recomendarle otras fuentes de informacin.  Comunquese con el servicio de salud de su localidad o su estado.  Comunquese con los Centros para Building surveyor y la Prevencin de Probation officer for Disease Control and Prevention , CDC).  Llame al [phone] (1-800-CDC-INFO).  Visite el sitio Biomedical engineer en http://hunter.com/. Declaracin de informacin sobre la vacuna contra la difteria y el ttanos (Td) de los CDC (03/01/16) Esta informacin no tiene Marine scientist el consejo del mdico. Asegrese de hacerle al mdico cualquier pregunta que tenga. Document Released: 02/23/2009 Document Revised: 11/28/2014 Document  Reviewed: 03/01/2016 Elsevier Interactive Patient Education  2017 [place] trimestre de Media planner (Third Trimester of Pregnancy) El tercer trimestre comprende desde la [id number] hasta la [id number], es decir, desde el mes7 hasta el mes9. El tercer trimestre es un perodo en el que el feto crece rpidamente. Hacia el final del noveno mes, el feto mide  alrededor de 20pulgadas (45cm) de largo y pesa entre 6 y 20 libras (2,700 y 78,500kg). CAMBIOS EN EL ORGANISMO Su organismo atraviesa por muchos cambios durante el [place], y estos varan de Ardelia Mems mujer a Theatre manager.  Seguir [employer] Family Insurance. Es de esperar que aumente entre 25 y 35libras (25 y 16kg) hacia el final del Media planner.  Podrn aparecer las primeras Apache Corporation caderas, el abdomen y las [place].  Puede tener necesidad de Garment/textile technologist con ms frecuencia porque el feto baja hacia la pelvis y ejerce presin sobre la vejiga.  Debido al Glennis Brink podr sentir Victorio Palm estomacal con frecuencia.  Puede estar estreida, ya que ciertas hormonas enlentecen los movimientos de los msculos que JPMorgan Chase & Co desechos a travs de los intestinos.  Pueden aparecer hemorroides o abultarse e hincharse las venas (venas varicosas).  Puede sentir dolor plvico debido al Medtronic y a que las hormonas del Scientist, research (life sciences) las articulaciones entre los huesos de la pelvis. El dolor de espalda puede ser consecuencia de la sobrecarga de los msculos que soportan la [place].  Tal vez haya cambios en el cabello que pueden incluir su engrosamiento, crecimiento rpido y cambios en la textura. Adems, a algunas mujeres se les cae el cabello durante o despus del embarazo, o tienen el cabello seco o fino. Lo ms probable es que el cabello se le normalice despus del nacimiento del beb.  Las Lincoln National Corporation seguirn creciendo y Teaching laboratory technician. A veces, puede haber una secrecin amarilla de las mamas llamada calostro.  El ombligo puede salir hacia afuera.  Puede sentir que le falta el aire debido a que se expande el tero.  Puede notar que el feto "baja" o lo siente ms bajo, en el abdomen.  Puede tener una prdida de secrecin mucosa con sangre. Esto suele ocurrir en el trmino de unos pocos das a una semana antes de que comience el [place] de [place].  El cuello del tero se vuelve delgado y blando (se borra) cerca de la fecha  de [place]. QU DEBE ESPERAR EN LOS EXMENES PRENATALES Le harn exmenes prenatales cada 2semanas hasta la semana36. A partir de ese momento le harn exmenes semanales. Durante una visita prenatal de rutina:  La pesarn para asegurarse de que usted y el feto estn creciendo normalmente.  Le tomarn la presin arterial.  Le medirn el abdomen para controlar el desarrollo del beb.  Se escucharn los latidos cardacos fetales.  Se evaluarn los resultados de los estudios solicitados en visitas anteriores.  Le revisarn el cuello del tero cuando est prxima la fecha de parto para controlar si este se ha borrado. Alrededor de la semana36, el mdico le revisar el cuello del tero. Al mismo tiempo, realizar un anlisis de las secreciones del tejido vaginal. Este examen es para determinar si hay un tipo de bacteria, estreptococo Grupo B. El mdico le explicar esto con ms detalle. El mdico puede preguntarle lo siguiente:  Cmo le gustara que fuera el [place].  Cmo se siente.  Si siente los movimientos del beb.  Si ha tenido sntomas anormales, como prdida de lquido, [place], dolores de cabeza intensos o clicos abdominales.  Si  est consumiendo algn producto que contenga tabaco, como cigarrillos, tabaco de Higher education careers adviser y Psychologist, sport and exercise.  Si tiene Sunoco. Otros exmenes o estudios de deteccin que pueden realizarse durante el tercer trimestre incluyen lo siguiente:  Anlisis de sangre para controlar los niveles de hierro (anemia).  Controles fetales para determinar su salud, nivel de [place] y Mining engineer. Si tiene [place] enfermedad o hay problemas durante el embarazo, le harn estudios.  Prueba del VIH (virus de inmunodeficiencia humana). Si corre Electronics engineer, pueden realizarle una prueba de deteccin del VIH durante el tercer trimestre del embarazo. FALSO TRABAJO DE PARTO Es posible que sienta contracciones leves e irregulares que finalmente desaparecen.  Se llaman contracciones de Braxton Hicks o falso trabajo de [place]. Las Yahoo pueden durar horas, das o incluso semanas, antes de que el verdadero trabajo de parto se inicie. Si las contracciones ocurren a intervalos regulares, se intensifican o se hacen dolorosas, lo mejor es que la revise el mdico. SIGNOS DE TRABAJO DE PARTO  Clicos de tipo menstrual.  Contracciones cada 62minutos o menos.  Contracciones que comienzan en la parte superior del tero y se extienden hacia abajo, a la zona inferior del abdomen y la espalda.  Sensacin de mayor presin en la pelvis o dolor de espalda.  Una secrecin de mucosidad acuosa o con sangre que sale de la vagina. Si tiene alguno de estos signos antes de la [id number] del Media planner, llame a su mdico de inmediato. Debe concurrir al hospital para que la controlen inmediatamente. INSTRUCCIONES PARA EL CUIDADO EN EL HOGAR  Evite fumar, consumir hierbas, beber alcohol y tomar frmacos que no le hayan recetado. Estas sustancias qumicas afectan la formacin y el desarrollo del beb.  No consuma ningn producto que contenga tabaco, lo que incluye cigarrillos, tabaco de Higher education careers adviser y Psychologist, sport and exercise. Si necesita ayuda para dejar de fumar, consulte al [employer]. Puede recibir asesoramiento y otro tipo de recursos para dejar de fumar.  [employer] mdico en relacin con el uso de medicamentos. Durante el embarazo, hay medicamentos que son seguros de tomar y otros que no.  Haga ejercicio solamente como se lo haya indicado el mdico. Sentir clicos uterinos es un buen signo para Ambulance person actividad fsica.  Contine comiendo alimentos sanos con regularidad.  Use un sostn que le brinde buen soporte si le Nord[place].  No se d baos de inmersin en agua caliente, baos turcos ni saunas.  Use el cinturn de seguridad en todo momento mientras conduce.  No coma carne cruda ni queso sin cocinar; evite el contacto con las bandejas  sanitarias de los gatos y la tierra que estos animales usan. Estos elementos contienen grmenes que pueden causar defectos congnitos en el beb.  [place].  Tome entre 1500 y 2000mg  de calcio diariamente comenzando en la [id number] del embarazo Arnold.  Si est estreida, pruebe un laxante suave (si el mdico lo autoriza). Consuma ms alimentos ricos en fibra, como vegetales y frutas frescos y Psychologist, prison and probation services. Beba gran cantidad de lquido para mantener la orina de tono claro o color amarillo plido.  Dese baos de asiento con agua tibia para Best boy o las molestias causadas por las hemorroides. Use una crema para las hemorroides si el mdico la autoriza.  Si tiene venas varicosas, use medias de descanso. Eleve los pies durante 51minutos, 3 o 4veces por da. Limite el consumo de sal en su dieta.  Evite levantar objetos pesados, use zapatos de tacones bajos  y [place].  Descanse con las piernas elevadas si tiene calambres o dolor de cintura.  Visite a su dentista si no lo ha Quarry manager. Use un cepillo de dientes blando para higienizarse los dientes y psese el hilo dental con suavidad.  Puede seguir American Electric Power, a menos que el mdico le indique lo contrario.  No haga viajes largos excepto que sea absolutamente necesario y solo con la autorizacin del [place] clases prenatales para Development worker, international aid, Psychologist, prison and probation services y hacer preguntas sobre el [place] de parto y [place].  Haga un ensayo de la partida al hospital.  Prepare el bolso que llevar al hospital.  Prepare la habitacin del beb.  Concurra a todas las visitas prenatales segn las indicaciones de su mdico. SOLICITE ATENCIN MDICA SI:  No est segura de que est en trabajo de parto o de que ha roto la bolsa de las aguas.  Tiene mareos.  Siente clicos leves, presin en la pelvis o dolor persistente en el abdomen.  Tiene nuseas,  vmitos o diarrea persistentes.  Margette Fast secrecin vaginal con mal olor.  Siente dolor al Continental Airlines. SOLICITE ATENCIN MDICA DE INMEDIATO SI:  Tiene fiebre.  Tiene una prdida de lquido por la vagina.  Tiene sangrado o pequeas prdidas vaginales.  Siente dolor intenso o clicos en el abdomen.  Sube o baja de peso rpidamente.  Tiene dificultad para respirar y siente dolor de pecho.  Sbitamente se le hinchan mucho el rostro, las [place], los tobillos, los pies o las piernas.  No ha sentido los movimientos del beb durante Leone Brand.  Siente un dolor de cabeza intenso que no se alivia con medicamentos.  Su visin se modifica. Esta informacin no tiene Marine scientist el consejo del mdico. Asegrese de hacerle al mdico cualquier pregunta que tenga. Document Released: 08/17/2005 Document Revised: 11/28/2014 Document Reviewed: 01/08/2013 Elsevier Interactive Patient Education  2017 [employer].

## 2017-03-16 ENCOUNTER — Other Ambulatory Visit: Payer: Self-pay | Admitting: General Practice

## 2017-03-16 ENCOUNTER — Ambulatory Visit
Admission: RE | Admit: 2017-03-16 | Discharge: 2017-03-16 | Disposition: A | Discharge status: Home / Self Care | Payer: Self-pay | Source: Ambulatory Visit | Attending: Obstetrics & Gynecology | Admitting: Obstetrics & Gynecology

## 2017-03-16 DIAGNOSIS — G939 Disorder of brain, unspecified: Secondary | ICD-10-CM

## 2017-03-16 LAB — COMPREHENSIVE METABOLIC PANEL
ALK PHOS: 80 IU/L (ref 39–117)
ALT: 19 IU/L (ref 0–32)
AST: 25 IU/L (ref 0–40)
Albumin/Globulin Ratio: 1.3 (ref 1.2–2.2)
Albumin: 3.6 g/dL (ref 3.5–5.5)
BUN/Creatinine Ratio: 13 (ref 9–23)
BUN: 7 mg/dL (ref 6–20)
Bilirubin Total: 0.5 mg/dL (ref 0.0–1.2)
CHLORIDE: 98 mmol/L (ref 96–106)
CO2: 22 mmol/L (ref 18–29)
CREATININE: 0.52 mg/dL — AB (ref 0.57–1.00)
Calcium: 8.9 mg/dL (ref 8.7–10.2)
GFR calc Af Amer: 142 mL/min/{1.73_m2} (ref >59)
GFR calc non Af Amer: 123 mL/min/{1.73_m2} (ref >59)
GLUCOSE: 152 mg/dL — AB (ref 65–99)
Globulin, Total: 2.7 g/dL (ref 1.5–4.5)
Potassium: 3.9 mmol/L (ref 3.5–5.2)
SODIUM: 137 mmol/L (ref 134–144)
Total Protein: 6.3 g/dL (ref 6.0–8.5)

## 2017-03-16 LAB — GLUCOSE TOLERANCE, 2 HOURS W/ 1HR
GLUCOSE, FASTING: 79 mg/dL (ref 65–91)
Glucose, 1 hour: 157 mg/dL (ref 65–179)
Glucose, 2 hour: 111 mg/dL (ref 65–152)

## 2017-03-16 LAB — TSH: TSH: 0.956 u[IU]/mL (ref 0.450–4.500)

## 2017-03-16 LAB — T3, FREE: T3 FREE: 2.9 pg/mL (ref 2.0–4.4)

## 2017-03-16 LAB — RPR: RPR Ser Ql: NONREACTIVE

## 2017-03-16 LAB — CBC
Hematocrit: 36.9 % (ref 34.0–46.6)
Hemoglobin: 12.3 g/dL (ref 11.1–15.9)
MCH: 28.7 pg (ref 26.6–33.0)
MCHC: 33.3 g/dL (ref 31.5–35.7)
MCV: 86 fL (ref 79–97)
PLATELETS: 183 10*3/uL (ref 150–379)
RBC: 4.28 x10E6/uL (ref 3.77–5.28)
RDW: 14 % (ref 12.3–15.4)
WBC: 9.8 10*3/uL (ref 3.4–10.8)

## 2017-03-16 LAB — HIV ANTIBODY (ROUTINE TESTING W REFLEX): HIV Screen 4th Generation wRfx: NONREACTIVE

## 2017-03-16 LAB — T4, FREE: Free T4: 1.1 ng/dL (ref 0.82–1.77)

## 2017-03-27 ENCOUNTER — Encounter: Payer: Self-pay | Admitting: *Deleted

## 2017-04-05 ENCOUNTER — Encounter: Payer: Self-pay | Admitting: Gynecology

## 2017-05-08 ENCOUNTER — Ambulatory Visit (INDEPENDENT_AMBULATORY_CARE_PROVIDER_SITE_OTHER): Payer: Self-pay | Admitting: Obstetrics and Gynecology

## 2017-05-08 VITALS — BP 101/61 | HR 83 | Wt 175.0 lb

## 2017-05-08 DIAGNOSIS — E039 Hypothyroidism, unspecified: Secondary | ICD-10-CM

## 2017-05-08 DIAGNOSIS — Z113 Encounter for screening for infections with a predominantly sexual mode of transmission: Secondary | ICD-10-CM

## 2017-05-08 DIAGNOSIS — G939 Disorder of brain, unspecified: Secondary | ICD-10-CM

## 2017-05-08 DIAGNOSIS — O99283 Endocrine, nutritional and metabolic diseases complicating pregnancy, third trimester: Secondary | ICD-10-CM

## 2017-05-08 DIAGNOSIS — O0992 Supervision of high risk pregnancy, unspecified, second trimester: Secondary | ICD-10-CM

## 2017-05-08 DIAGNOSIS — O0993 Supervision of high risk pregnancy, unspecified, third trimester: Secondary | ICD-10-CM

## 2017-05-08 DIAGNOSIS — O99280 Endocrine, nutritional and metabolic diseases complicating pregnancy, unspecified trimester: Secondary | ICD-10-CM

## 2017-05-08 DIAGNOSIS — O09523 Supervision of elderly multigravida, third trimester: Secondary | ICD-10-CM

## 2017-05-08 DIAGNOSIS — E669 Obesity, unspecified: Secondary | ICD-10-CM

## 2017-05-08 MED ORDER — LEVOTHYROXINE SODIUM 50 MCG PO TABS: 50.0000 ug | ORAL_TABLET | Freq: Every day | ORAL | 5 refills | Status: DC

## 2017-05-08 NOTE — Progress Notes (Signed)
Prenatal Visit Note Date: 05/08/2017 Clinic: [facility]  Subjective:  Shelly Greene is a 36 y.o. [id number] at [redacted]w[redacted]d being seen today for ongoing prenatal care.  She is currently monitored for the following issues for this high-risk pregnancy and has CARPAL TUNNEL SYNDROME; Brain stem lesion; Hypothyroidism during pregnancy, antepartum; Supervision of high risk pregnancy in second trimester; History of gestational diabetes; Advanced maternal age in multigravida; Obesity (BMI 30.0-34.9); and Obesity affecting pregnancy in second trimester on her problem list.  Patient reports no complaints.   Contractions: Not present. Vag. Bleeding: None.  Movement: Present. Denies leaking of fluid.   The following portions of the patient's history were reviewed and updated as appropriate: allergies, current medications, past family history, past medical history, past social history, past surgical history and problem list. Problem list updated.  Objective:   Vitals:   05/08/17 1325  BP: 101/61  Pulse: 83  Weight: 175 lb (79.4 kg)    Fetal Status: Fetal Heart Rate (bpm): 146 Fundal Height: 36 cm Movement: Present  Presentation: Vertex  General:  Alert, oriented and cooperative. Patient is in no acute distress.  Skin: Skin is warm and dry. No rash noted.   Cardiovascular: Normal heart rate noted  Respiratory: Normal respiratory effort, no problems with respiration noted  Abdomen: Soft, gravid, appropriate for gestational age. Pain/Pressure: Present     Pelvic:  Cervical exam performed Dilation: 1 Effacement (%): 20 Station: Ballotable  Extremities: Normal range of motion.  Edema: None  Mental Status: Normal mood and affect. Normal behavior. Normal judgment and thought content.   Urinalysis:      Assessment and Plan:  Pregnancy: [id number] at [redacted]w[redacted]d  1. Supervision of high risk pregnancy in second trimester Routine care. D/w pt re: BC nv - TSH - Strep Gp B NAA -  Cervicovaginal ancillary only  2. Hypothyroidism during pregnancy, antepartum Continue synth 50 qday. Recheck tsh today - TSH  3. Elderly multigravida in third trimester No issues  4. Obesity (BMI 30.0-34.9) No issues  5. Brain stem lesion Seen by [facility] 03/2017. NTD. Asked front desk to set up anesthesia appt with pt. Possibility may not be able to do regional.   Preterm labor symptoms and general obstetric precautions including but not limited to vaginal bleeding, contractions, leaking of fluid and fetal movement were reviewed in detail with the patient. Please refer to After Visit Summary for other counseling recommendations.  Return for per baby scripts.   Aletha Halim, MD

## 2017-05-09 LAB — TSH: TSH: 0.811 u[IU]/mL (ref 0.450–4.500)

## 2017-05-10 LAB — STREP GP B NAA: STREP GROUP B AG: NEGATIVE

## 2017-05-10 LAB — CERVICOVAGINAL ANCILLARY ONLY
Chlamydia: NEGATIVE
Neisseria Gonorrhea: NEGATIVE
TRICH (WINDOWPATH): NEGATIVE

## 2017-05-12 ENCOUNTER — Telehealth (HOSPITAL_COMMUNITY): Payer: Self-pay | Admitting: *Deleted

## 2017-05-12 NOTE — Telephone Encounter (Signed)
Preadmission screen  

## 2017-05-18 ENCOUNTER — Telehealth (HOSPITAL_COMMUNITY): Payer: Self-pay | Admitting: *Deleted

## 2017-05-18 NOTE — Telephone Encounter (Signed)
Preadmission screen Called to schedule preadmit anesthesia consult

## 2017-05-18 NOTE — Telephone Encounter (Signed)
Scheduled anesthesia consult as requested.  Reviewed chart with Dr Tobias Alexander (anesthesia) Upon review Dr Tobias Alexander requested I obtain a recommendation from her neurosurgeon clarifying use of regional anesthesia during labor.  Dr Tobias Alexander stated the neurosurgeon note was needed rather than an actual consult.  Called Dr Zupruk's office and asked for a note to be placed on her record with this recommendation.  I notifed the office at [facility] and Dr Ihor Dow of the conversations with Anesthesia and Neurosurgeon office.

## 2017-05-22 ENCOUNTER — Ambulatory Visit (INDEPENDENT_AMBULATORY_CARE_PROVIDER_SITE_OTHER): Payer: Self-pay | Admitting: Obstetrics & Gynecology

## 2017-05-22 ENCOUNTER — Encounter (HOSPITAL_COMMUNITY): Admission: RE | Admit: 2017-05-22 | Payer: Self-pay | Source: Ambulatory Visit

## 2017-05-22 VITALS — BP 104/67 | HR 80 | Wt 175.0 lb

## 2017-05-22 DIAGNOSIS — G939 Disorder of brain, unspecified: Secondary | ICD-10-CM

## 2017-05-22 DIAGNOSIS — O0992 Supervision of high risk pregnancy, unspecified, second trimester: Secondary | ICD-10-CM

## 2017-05-22 DIAGNOSIS — O321XX0 Maternal care for breech presentation, not applicable or unspecified: Secondary | ICD-10-CM

## 2017-05-22 NOTE — Patient Instructions (Signed)
Return to clinic for any scheduled appointments or obstetric concerns, or go to [facility] for evaluation  

## 2017-05-22 NOTE — Progress Notes (Signed)
   PRENATAL VISIT NOTE  Subjective:  Shelly Greene is a 36 y.o. [id number] at [redacted]w[redacted]d being seen today for ongoing prenatal care.  She is currently monitored for the following issues for this high-risk pregnancy and has CARPAL TUNNEL SYNDROME; Brain stem lesion; Hypothyroidism during pregnancy, antepartum; Supervision of high risk pregnancy in second trimester; History of gestational diabetes; Advanced maternal age in multigravida; Obesity (BMI 30.0-34.9); and Obesity affecting pregnancy in second trimester on her problem list.  Patient reports occasional contractions and increased fetal activity.  Contractions: Irregular. Vag. Bleeding: None.  Movement: Present. Denies leaking of fluid.   The following portions of the patient's history were reviewed and updated as appropriate: allergies, current medications, past family history, past medical history, past social history, past surgical history and problem list. Problem list updated.  Objective:   Vitals:   05/22/17 1353  BP: 104/67  Pulse: 80  Weight: 175 lb (79.4 kg)    Fetal Status: Fetal Heart Rate (bpm): 144 Fundal Height: 39 cm Movement: Present  Presentation: Complete Breech  General:  Alert, oriented and cooperative. Patient is in no acute distress.  Skin: Skin is warm and dry. No rash noted.   Cardiovascular: Normal heart rate noted  Respiratory: Normal respiratory effort, no problems with respiration noted  Abdomen: Soft, gravid, appropriate for gestational age. Pain/Pressure: Present     Pelvic:  Cervical exam performed Dilation: 1 Effacement (%): Thick Station: Ballotable  Extremities: Normal range of motion.  Edema: None  Mental Status: Normal mood and affect. Normal behavior. Normal judgment and thought content.   Assessment and Plan:  Pregnancy: [id number] at [redacted]w[redacted]d  1. Brain stem lesion - Ambulatory referral to Neurosurgery; referral done by Janett Labella, RN by phone. Also sent personal EPIC message to Dr. Kathrene Alu asking  for recommendations about feasilibility of regional anesthesia during labor and delivery given stable brainstem lesion.   2. Breech presentation, single or unspecified fetus Verified on ultrasound today, was vertex last week. Will recheck next week. Patient considering ECV if still breech, does not want cesarean delivery.   3. Supervision of high risk pregnancy in second trimester Term labor symptoms and general obstetric precautions including but not limited to vaginal bleeding, contractions, leaking of fluid and fetal movement were reviewed in detail with the patient. Please refer to After Visit Summary for other counseling recommendations.  Return in about 1 week (around 05/29/2017) for OB Visit.   Verita Schneiders, MD

## 2017-05-25 ENCOUNTER — Telehealth: Payer: Self-pay | Admitting: *Deleted

## 2017-05-25 NOTE — Telephone Encounter (Signed)
Pt called and LM for lab results - Rtn'd call and LM for pt that all lab results from Monday were negative and call back if needed.

## 2017-05-29 ENCOUNTER — Telehealth (HOSPITAL_COMMUNITY): Payer: Self-pay | Admitting: *Deleted

## 2017-05-29 NOTE — Telephone Encounter (Signed)
Spoke with Dr Radonna Ricker regarding pt receiving epidural or spinal with labor/ CS.  He states it is unknown whether the patient will be effected by spinal/epidural.  The fact that she had an epidural with her last pregnancy tends to indicate it would be ok, but he cannot state definitively that it is recommended.    He stated he had spoken with Dr Harolyn Rutherford on either 7/5 or 7/6 with this information, so [facility] is aware.  I have placed a call to Anesthesia but they are with a patient and will follow up with me today.

## 2017-05-29 NOTE — Progress Notes (Signed)
I spoke with Dr. Radonna Ricker from [facility] in [facility]. I requested that he write a note in the chart regarding the patient's perceived risk of neuraxial analgesia/anesthesia. He expressed to me the same uncertainty in risk. She has been remarkably stable and seemingly unaffected by this mass for more than a decade. His feeling, however, is that it is impossible to say what her risk of dural puncture would entail. This clearly needs to be written in the chart, giving estimated risk, in order for me and my group to be best suited to care for her here.

## 2017-05-29 NOTE — Telephone Encounter (Signed)
Spoke with Dr Lissa Hoard of anesthesia and relayed information from Dr Olevia Bowens.  Provided Dr Olevia Bowens contact number for additional information if needed.  He stated he had concerns about delivering this patient here and would pass this information to Dr Jillyn Hidden for further evaluation.

## 2017-05-30 ENCOUNTER — Ambulatory Visit (INDEPENDENT_AMBULATORY_CARE_PROVIDER_SITE_OTHER): Payer: Self-pay | Admitting: Obstetrics and Gynecology

## 2017-05-30 VITALS — BP 109/74 | HR 73 | Wt 175.0 lb

## 2017-05-30 DIAGNOSIS — O09523 Supervision of elderly multigravida, third trimester: Secondary | ICD-10-CM

## 2017-05-30 DIAGNOSIS — O0993 Supervision of high risk pregnancy, unspecified, third trimester: Secondary | ICD-10-CM

## 2017-05-30 DIAGNOSIS — E669 Obesity, unspecified: Secondary | ICD-10-CM

## 2017-05-30 DIAGNOSIS — O329XX0 Maternal care for malpresentation of fetus, unspecified, not applicable or unspecified: Secondary | ICD-10-CM

## 2017-05-30 DIAGNOSIS — O99213 Obesity complicating pregnancy, third trimester: Secondary | ICD-10-CM

## 2017-05-30 DIAGNOSIS — G939 Disorder of brain, unspecified: Secondary | ICD-10-CM

## 2017-05-30 DIAGNOSIS — E039 Hypothyroidism, unspecified: Secondary | ICD-10-CM

## 2017-05-30 DIAGNOSIS — O99280 Endocrine, nutritional and metabolic diseases complicating pregnancy, unspecified trimester: Secondary | ICD-10-CM

## 2017-05-30 DIAGNOSIS — O0992 Supervision of high risk pregnancy, unspecified, second trimester: Secondary | ICD-10-CM

## 2017-05-30 DIAGNOSIS — O99283 Endocrine, nutritional and metabolic diseases complicating pregnancy, third trimester: Secondary | ICD-10-CM

## 2017-05-30 NOTE — Progress Notes (Signed)
Prenatal Visit Note Date: 05/30/2017 Clinic: [facility]  Subjective:  Shelly Greene is a 36 y.o. [id number] at [redacted]w[redacted]d being seen today for ongoing prenatal care.  She is currently monitored for the following issues for this high-risk pregnancy and has CARPAL TUNNEL SYNDROME; Brain stem lesion; Hypothyroidism during pregnancy, antepartum; Supervision of high risk pregnancy in second trimester; History of gestational diabetes; Advanced maternal age in multigravida; Obesity (BMI 30.0-34.9); Obesity affecting pregnancy in second trimester; and Malpresentation before onset of labor on her problem list.  Patient reports no complaints.   Contractions: Irregular. Vag. Bleeding: None.  Movement: Present. Denies leaking of fluid.   The following portions of the patient's history were reviewed and updated as appropriate: allergies, current medications, past family history, past medical history, past social history, past surgical history and problem list. Problem list updated.  Objective:   Vitals:   05/30/17 1637  BP: 109/74  Pulse: 73  Weight: 175 lb (79.4 kg)    Fetal Status: Fetal Heart Rate (bpm): 135 Fundal Height: 39 cm Movement: Present  Presentation: Transverse  General:  Alert, oriented and cooperative. Patient is in no acute distress.  Skin: Skin is warm and dry. No rash noted.   Cardiovascular: Normal heart rate noted  Respiratory: Normal respiratory effort, no problems with respiration noted  Abdomen: Soft, gravid, appropriate for gestational age. Pain/Pressure: Present     Pelvic:  Cervical exam deferred        Extremities: Normal range of motion.  Edema: None  Mental Status: Normal mood and affect. Normal behavior. Normal judgment and thought content.   Urinalysis:      Assessment and Plan:  Pregnancy: [id number] at [redacted]w[redacted]d  1. Malpresentation before onset of labor, single or unspecified fetus Bedside u/s today again shows non vertex with  transverse lie with fetal head on maternal right and back up. D/w her need for immediate ECV or primary c-section. Risks of ECV d/w her as well as c-section and she wants to think about it. D/w her labor risks with malpresentation. Also d/w her will likely need GETA if for c-section. Will bring patient back tomorrow for 1st visit in AM. Pt told to be NPO after MN.  2. Brain stem lesion See above. Doesn't appear to be resolved whether she can have regional or not. When patient comes in for delivery, whether vaginal or c-section, plan will obviously be clarified then.  3. Obesity (BMI 30.0-34.9) No issues  4. Elderly multigravida in third trimester No issues.  5. Supervision of high risk pregnancy in second trimester See above  6. Hypothyroidism during pregnancy, antepartum Continue synthroid  Term labor symptoms and general obstetric precautions including but not limited to vaginal bleeding, contractions, leaking of fluid and fetal movement were reviewed in detail with the patient. Please refer to After Visit Summary for other counseling recommendations.  Return in about 1 day (around 05/31/2017).   Aletha Halim, MD

## 2017-05-31 ENCOUNTER — Encounter (HOSPITAL_COMMUNITY): Payer: Self-pay | Admitting: *Deleted

## 2017-05-31 ENCOUNTER — Ambulatory Visit (INDEPENDENT_AMBULATORY_CARE_PROVIDER_SITE_OTHER): Payer: Self-pay | Admitting: Family Medicine

## 2017-05-31 ENCOUNTER — Encounter (HOSPITAL_COMMUNITY)
Admission: RE | Admit: 2017-05-31 | Discharge: 2017-05-31 | Disposition: A | Discharge status: Home / Self Care | Payer: Medicaid Other | Source: Ambulatory Visit

## 2017-05-31 ENCOUNTER — Encounter (HOSPITAL_COMMUNITY): Payer: Self-pay

## 2017-05-31 ENCOUNTER — Telehealth (HOSPITAL_COMMUNITY): Payer: Self-pay | Admitting: *Deleted

## 2017-05-31 DIAGNOSIS — O0992 Supervision of high risk pregnancy, unspecified, second trimester: Secondary | ICD-10-CM

## 2017-05-31 DIAGNOSIS — O0993 Supervision of high risk pregnancy, unspecified, third trimester: Secondary | ICD-10-CM

## 2017-05-31 NOTE — Progress Notes (Signed)
Bedside US confirms transverse presentation - pt desires cesarean section instead of version

## 2017-05-31 NOTE — Progress Notes (Signed)
Jerrye Beavers from [facility] office called OB/OR Charge phone to schedule Primary Cesarean Section for 06/01/17 at 0930. Reason for C/S being Breech Presentation. In addition, it is noted that the patient has a Brain Stem Lesion.   Case scheduled for 06/01/17 at 0930. RN Daralene Milch will discuss with Anesthesia and call Jerrye Beavers back if any issues.   At 0930, RN met and spoke with Lyndle Herrlich of Anesthesia. Per Dr Glennon Mac, it is okay to go ahead and schedule case. They have been aware of this patient for some time, and Anesthesia will follow up and develop their plan of care accordingly. Dr Jillyn Hidden is on call tomorrow and Dr Glennon Mac will consult him as well.  At 0950, RN met with Page Spiro and Paralee Cancel to inform them of scheduling this case for tomorrow. They, too, were aware of the possibility of [place] receiving this patient in the near future.   At 37, RN called Dr Ihor Dow and left message on voicemail, requesting call back to complete the circle of communication.   Rachel Samples [place], [place] 05/31/2017 10:58 AM

## 2017-05-31 NOTE — Telephone Encounter (Signed)
PAT completed.  Spoke with Dr Glennon Mac with pt questions regarding general anesthesia.  Deferred to Dr Jillyn Hidden since he will be caring for pt tomorrow.    Updated pt by phone and preop instructions completed.

## 2017-05-31 NOTE — Progress Notes (Signed)
   PRENATAL VISIT NOTE  Subjective:  Shelly Greene is a 36 y.o. [id number] at [redacted]w[redacted]d being seen today for ongoing prenatal care.  She is currently monitored for the following issues for this high-risk pregnancy and has CARPAL TUNNEL SYNDROME; Brain stem lesion; Hypothyroidism during pregnancy, antepartum; Supervision of high risk pregnancy in second trimester; History of gestational diabetes; Advanced maternal age in multigravida; Obesity (BMI 30.0-34.9); Obesity affecting pregnancy in second trimester; and Malpresentation before onset of labor on her problem list.  Patient reports no complaints.  Contractions: Irregular. Vag. Bleeding: None.  Movement: Present. Denies leaking of fluid.   The following portions of the patient's history were reviewed and updated as appropriate: allergies, current medications, past family history, past medical history, past social history, past surgical history and problem list. Problem list updated.  Objective:   Vitals:   05/31/17 0847  BP: 116/77  Pulse: 72  Weight: 174 lb (78.9 kg)    Fetal Status: Fetal Heart Rate (bpm): 130   Movement: Present  Presentation: Transverse  General:  Alert, oriented and cooperative. Patient is in no acute distress.  Skin: Skin is warm and dry. No rash noted.   Cardiovascular: Normal heart rate noted  Respiratory: Normal respiratory effort, no problems with respiration noted  Abdomen: Soft, gravid, appropriate for gestational age. Pain/Pressure: Present     Pelvic:  Cervical exam deferred        Extremities: Normal range of motion.  Edema: None  Mental Status: Normal mood and affect. Normal behavior. Normal judgment and thought content.   Assessment and Plan:  Pregnancy: [id number] at [redacted]w[redacted]d  1. Supervision of high risk pregnancy in second trimester Continue breech, head on maternal left. Desires C-section, declines ECV Scheduled CS form 7/12 at 930, instructed on what to expect. Arrival at 7 AM, NPO after midnight.  Reviewed general anesthesia and that partner will not be in OR. Reviewed safety of breastfeeding after CS as patient has questions about this.  Client might desire BTL at time of CS. Discussed $20 dollar charge for procedure Had questions about circumcision, likely outpatient   Term labor symptoms and general obstetric precautions including but not limited to vaginal bleeding, contractions, leaking of fluid and fetal movement were reviewed in detail with the patient. Please refer to After Visit Summary for other counseling recommendations.  Return in about 1 year (around 05/31/2018) for at [facility] hospital for CS.   Caren Macadam, MD

## 2017-06-01 ENCOUNTER — Encounter (HOSPITAL_COMMUNITY)
Admission: RE | Disposition: A | Discharge status: Home / Self Care | Payer: Self-pay | Source: Ambulatory Visit | Attending: Obstetrics & Gynecology

## 2017-06-01 ENCOUNTER — Inpatient Hospital Stay (HOSPITAL_COMMUNITY)
Admission: RE | Admit: 2017-06-01 | Discharge: 2017-06-04 | DRG: 766 | Disposition: A | Discharge status: Home / Self Care | Payer: Medicaid Other | Source: Ambulatory Visit | Attending: Obstetrics & Gynecology | Admitting: Obstetrics & Gynecology

## 2017-06-01 ENCOUNTER — Inpatient Hospital Stay (HOSPITAL_COMMUNITY): Payer: Medicaid Other | Admitting: Anesthesiology

## 2017-06-01 ENCOUNTER — Encounter (HOSPITAL_COMMUNITY): Payer: Self-pay | Admitting: Anesthesiology

## 2017-06-01 DIAGNOSIS — O99212 Obesity complicating pregnancy, second trimester: Secondary | ICD-10-CM | POA: Diagnosis present

## 2017-06-01 DIAGNOSIS — G939 Disorder of brain, unspecified: Secondary | ICD-10-CM | POA: Diagnosis present

## 2017-06-01 DIAGNOSIS — O09529 Supervision of elderly multigravida, unspecified trimester: Secondary | ICD-10-CM

## 2017-06-01 DIAGNOSIS — Z8632 Personal history of gestational diabetes: Secondary | ICD-10-CM | POA: Diagnosis present

## 2017-06-01 DIAGNOSIS — O99284 Endocrine, nutritional and metabolic diseases complicating childbirth: Secondary | ICD-10-CM | POA: Diagnosis present

## 2017-06-01 DIAGNOSIS — Z3A39 39 weeks gestation of pregnancy: Secondary | ICD-10-CM

## 2017-06-01 DIAGNOSIS — O321XX0 Maternal care for breech presentation, not applicable or unspecified: Secondary | ICD-10-CM | POA: Diagnosis present

## 2017-06-01 DIAGNOSIS — E039 Hypothyroidism, unspecified: Secondary | ICD-10-CM | POA: Diagnosis present

## 2017-06-01 DIAGNOSIS — O99280 Endocrine, nutritional and metabolic diseases complicating pregnancy, unspecified trimester: Secondary | ICD-10-CM

## 2017-06-01 DIAGNOSIS — O0992 Supervision of high risk pregnancy, unspecified, second trimester: Secondary | ICD-10-CM

## 2017-06-01 DIAGNOSIS — O99214 Obesity complicating childbirth: Secondary | ICD-10-CM | POA: Diagnosis present

## 2017-06-01 DIAGNOSIS — Z98891 History of uterine scar from previous surgery: Secondary | ICD-10-CM

## 2017-06-01 DIAGNOSIS — E669 Obesity, unspecified: Secondary | ICD-10-CM | POA: Diagnosis present

## 2017-06-01 DIAGNOSIS — Z302 Encounter for sterilization: Secondary | ICD-10-CM | POA: Diagnosis not present

## 2017-06-01 DIAGNOSIS — Z6831 Body mass index (BMI) 31.0-31.9, adult: Secondary | ICD-10-CM

## 2017-06-01 LAB — CBC
HEMATOCRIT: 36.8 % (ref 36.0–46.0)
HEMOGLOBIN: 12.7 g/dL (ref 12.0–15.0)
MCH: 29.7 pg (ref 26.0–34.0)
MCHC: 34.5 g/dL (ref 30.0–36.0)
MCV: 86 fL (ref 78.0–100.0)
Platelets: 136 10*3/uL — ABNORMAL LOW (ref 150–400)
RBC: 4.28 MIL/uL (ref 3.87–5.11)
RDW: 13.7 % (ref 11.5–15.5)
WBC: 7.9 10*3/uL (ref 4.0–10.5)

## 2017-06-01 LAB — TYPE AND SCREEN
ABO/RH(D): B POS
Antibody Screen: NEGATIVE

## 2017-06-01 LAB — RPR: RPR Ser Ql: NONREACTIVE

## 2017-06-01 SURGERY — Surgical Case
Anesthesia: General | Site: Abdomen | Wound class: Clean Contaminated

## 2017-06-01 MED ORDER — OXYTOCIN 10 UNIT/ML IJ SOLN
INTRAMUSCULAR | Status: AC
  Filled 2017-06-01: qty 4

## 2017-06-01 MED ORDER — ACETAMINOPHEN 10 MG/ML IV SOLN
INTRAVENOUS | Status: AC
  Filled 2017-06-01: qty 100

## 2017-06-01 MED ORDER — LACTATED RINGERS IV SOLN
INTRAVENOUS | Status: DC
  Administered 2017-06-01 – 2017-06-02 (×2): via INTRAVENOUS
  Administered 2017-06-02: 999 mL via INTRAVENOUS

## 2017-06-01 MED ORDER — HYDROMORPHONE HCL 1 MG/ML IJ SOLN
INTRAMUSCULAR | Status: AC
  Filled 2017-06-01: qty 1

## 2017-06-01 MED ORDER — LIDOCAINE HCL (CARDIAC) 20 MG/ML IV SOLN
INTRAVENOUS | Status: AC
  Filled 2017-06-01: qty 5

## 2017-06-01 MED ORDER — ROCURONIUM BROMIDE 100 MG/10ML IV SOLN
INTRAVENOUS | Status: DC | PRN
  Administered 2017-06-01: 60 mg via INTRAVENOUS

## 2017-06-01 MED ORDER — DIPHENHYDRAMINE HCL 25 MG PO CAPS: 25.0000 mg | ORAL_CAPSULE | Freq: Four times a day (QID) | ORAL | Status: DC | PRN

## 2017-06-01 MED ORDER — SODIUM CHLORIDE 0.9% FLUSH: 9.0000 mL | INTRAVENOUS | Status: DC | PRN

## 2017-06-01 MED ORDER — DIPHENHYDRAMINE HCL 12.5 MG/5ML PO ELIX
12.5000 mg | ORAL_SOLUTION | Freq: Four times a day (QID) | ORAL | Status: DC | PRN
  Filled 2017-06-01: qty 5

## 2017-06-01 MED ORDER — ONDANSETRON HCL 4 MG/2ML IJ SOLN: 4.0000 mg | Freq: Four times a day (QID) | INTRAMUSCULAR | Status: DC | PRN

## 2017-06-01 MED ORDER — MEPERIDINE HCL 25 MG/ML IJ SOLN: 6.2500 mg | INTRAMUSCULAR | Status: DC | PRN

## 2017-06-01 MED ORDER — SIMETHICONE 80 MG PO CHEW
80.0000 mg | CHEWABLE_TABLET | ORAL | Status: DC
  Administered 2017-06-02 – 2017-06-03 (×3): 80 mg via ORAL
  Filled 2017-06-01 (×3): qty 1

## 2017-06-01 MED ORDER — HYDROMORPHONE HCL 1 MG/ML IJ SOLN
INTRAMUSCULAR | Status: AC
  Filled 2017-06-01: qty 0.5

## 2017-06-01 MED ORDER — ONDANSETRON HCL 4 MG/2ML IJ SOLN
INTRAMUSCULAR | Status: AC
  Filled 2017-06-01: qty 2

## 2017-06-01 MED ORDER — DEXAMETHASONE SODIUM PHOSPHATE 10 MG/ML IJ SOLN
INTRAMUSCULAR | Status: DC | PRN
  Administered 2017-06-01: 4 mg via INTRAVENOUS

## 2017-06-01 MED ORDER — KETOROLAC TROMETHAMINE 30 MG/ML IJ SOLN
INTRAMUSCULAR | Status: AC
  Filled 2017-06-01: qty 1

## 2017-06-01 MED ORDER — NALOXONE HCL 0.4 MG/ML IJ SOLN: 0.4000 mg | INTRAMUSCULAR | Status: DC | PRN

## 2017-06-01 MED ORDER — OXYTOCIN 10 UNIT/ML IJ SOLN
INTRAMUSCULAR | Status: DC | PRN
  Administered 2017-06-01: 40 [IU] via INTRAVENOUS

## 2017-06-01 MED ORDER — ROCURONIUM BROMIDE 100 MG/10ML IV SOLN
INTRAVENOUS | Status: AC
  Filled 2017-06-01: qty 1

## 2017-06-01 MED ORDER — COCONUT OIL OIL: 1.0000 "application " | TOPICAL_OIL | Status: DC | PRN

## 2017-06-01 MED ORDER — PROPOFOL 10 MG/ML IV BOLUS
INTRAVENOUS | Status: AC
  Filled 2017-06-01: qty 20

## 2017-06-01 MED ORDER — SENNOSIDES-DOCUSATE SODIUM 8.6-50 MG PO TABS
2.0000 | ORAL_TABLET | ORAL | Status: DC
  Administered 2017-06-02 – 2017-06-03 (×3): 2 via ORAL
  Filled 2017-06-01 (×3): qty 2

## 2017-06-01 MED ORDER — SIMETHICONE 80 MG PO CHEW
80.0000 mg | CHEWABLE_TABLET | Freq: Three times a day (TID) | ORAL | Status: DC
  Administered 2017-06-01 – 2017-06-03 (×7): 80 mg via ORAL
  Filled 2017-06-01 (×8): qty 1

## 2017-06-01 MED ORDER — LEVOTHYROXINE SODIUM 50 MCG PO TABS
50.0000 ug | ORAL_TABLET | Freq: Every day | ORAL | Status: DC
  Administered 2017-06-02 – 2017-06-04 (×3): 50 ug via ORAL
  Filled 2017-06-01 (×4): qty 1

## 2017-06-01 MED ORDER — FENTANYL CITRATE (PF) 100 MCG/2ML IJ SOLN
INTRAMUSCULAR | Status: DC | PRN
  Administered 2017-06-01: 250 ug via INTRAVENOUS

## 2017-06-01 MED ORDER — MIDAZOLAM HCL 2 MG/2ML IJ SOLN
INTRAMUSCULAR | Status: AC
  Filled 2017-06-01: qty 2

## 2017-06-01 MED ORDER — SUGAMMADEX SODIUM 500 MG/5ML IV SOLN
INTRAVENOUS | Status: AC
  Filled 2017-06-01: qty 5

## 2017-06-01 MED ORDER — HYDROMORPHONE HCL 1 MG/ML IJ SOLN
0.2500 mg | INTRAMUSCULAR | Status: DC | PRN
  Administered 2017-06-01 (×2): 0.5 mg via INTRAVENOUS

## 2017-06-01 MED ORDER — ACETAMINOPHEN 10 MG/ML IV SOLN
1000.0000 mg | Freq: Once | INTRAVENOUS | Status: AC
  Administered 2017-06-01: 1000 mg via INTRAVENOUS

## 2017-06-01 MED ORDER — HYDROMORPHONE HCL 1 MG/ML IJ SOLN
INTRAMUSCULAR | Status: DC | PRN
  Administered 2017-06-01: 1 mg via INTRAVENOUS

## 2017-06-01 MED ORDER — ACETAMINOPHEN 325 MG PO TABS
650.0000 mg | ORAL_TABLET | ORAL | Status: DC | PRN
  Administered 2017-06-01 – 2017-06-02 (×2): 650 mg via ORAL
  Filled 2017-06-01 (×2): qty 2

## 2017-06-01 MED ORDER — OXYTOCIN 40 UNITS IN LACTATED RINGERS INFUSION - SIMPLE MED: 2.5000 [IU]/h | INTRAVENOUS | Status: AC

## 2017-06-01 MED ORDER — DIBUCAINE 1 % RE OINT: 1.0000 "application " | TOPICAL_OINTMENT | RECTAL | Status: DC | PRN

## 2017-06-01 MED ORDER — FENTANYL CITRATE (PF) 250 MCG/5ML IJ SOLN
INTRAMUSCULAR | Status: AC
  Filled 2017-06-01: qty 5

## 2017-06-01 MED ORDER — MIDAZOLAM HCL 2 MG/2ML IJ SOLN
INTRAMUSCULAR | Status: DC | PRN
  Administered 2017-06-01: 2 mg via INTRAVENOUS

## 2017-06-01 MED ORDER — BUPIVACAINE HCL (PF) 0.5 % IJ SOLN
INTRAMUSCULAR | Status: AC
  Filled 2017-06-01: qty 30

## 2017-06-01 MED ORDER — LACTATED RINGERS IV SOLN
125.0000 mL/h | INTRAVENOUS | Status: DC
  Administered 2017-06-01 (×2): via INTRAVENOUS

## 2017-06-01 MED ORDER — KETOROLAC TROMETHAMINE 30 MG/ML IJ SOLN
30.0000 mg | Freq: Once | INTRAMUSCULAR | Status: DC | PRN
  Administered 2017-06-01: 30 mg via INTRAVENOUS

## 2017-06-01 MED ORDER — LACTATED RINGERS IV SOLN
INTRAVENOUS | Status: DC | PRN
  Administered 2017-06-01: 10:00:00 via INTRAVENOUS

## 2017-06-01 MED ORDER — SODIUM CHLORIDE 0.9 % IR SOLN
Status: DC | PRN
  Administered 2017-06-01: 1

## 2017-06-01 MED ORDER — PRENATAL MULTIVITAMIN CH
1.0000 | ORAL_TABLET | Freq: Every day | ORAL | Status: DC
  Administered 2017-06-02 – 2017-06-04 (×3): 1 via ORAL
  Filled 2017-06-01 (×2): qty 1

## 2017-06-01 MED ORDER — TETANUS-DIPHTH-ACELL PERTUSSIS 5-2.5-18.5 LF-MCG/0.5 IM SUSP: 0.5000 mL | Freq: Once | INTRAMUSCULAR | Status: DC

## 2017-06-01 MED ORDER — ZOLPIDEM TARTRATE 5 MG PO TABS: 5.0000 mg | ORAL_TABLET | Freq: Every evening | ORAL | Status: DC | PRN

## 2017-06-01 MED ORDER — CEFOTETAN DISODIUM-DEXTROSE 2-2.08 GM-% IV SOLR
2.0000 g | INTRAVENOUS | Status: AC
  Administered 2017-06-01: 2 g via INTRAVENOUS
  Filled 2017-06-01: qty 50

## 2017-06-01 MED ORDER — DIPHENHYDRAMINE HCL 50 MG/ML IJ SOLN: 12.5000 mg | Freq: Four times a day (QID) | INTRAMUSCULAR | Status: DC | PRN

## 2017-06-01 MED ORDER — MENTHOL 3 MG MT LOZG: 1.0000 | LOZENGE | OROMUCOSAL | Status: DC | PRN

## 2017-06-01 MED ORDER — BUPIVACAINE HCL (PF) 0.5 % IJ SOLN
INTRAMUSCULAR | Status: DC | PRN
  Administered 2017-06-01: 30 mL

## 2017-06-01 MED ORDER — ONDANSETRON HCL 4 MG/2ML IJ SOLN
INTRAMUSCULAR | Status: DC | PRN
  Administered 2017-06-01: 4 mg via INTRAVENOUS

## 2017-06-01 MED ORDER — PROMETHAZINE HCL 25 MG/ML IJ SOLN: 6.2500 mg | INTRAMUSCULAR | Status: DC | PRN

## 2017-06-01 MED ORDER — DEXAMETHASONE SODIUM PHOSPHATE 10 MG/ML IJ SOLN
INTRAMUSCULAR | Status: AC
  Filled 2017-06-01: qty 1

## 2017-06-01 MED ORDER — SUGAMMADEX SODIUM 500 MG/5ML IV SOLN
INTRAVENOUS | Status: DC | PRN
  Administered 2017-06-01: 350 mg via INTRAVENOUS

## 2017-06-01 MED ORDER — WITCH HAZEL-GLYCERIN EX PADS: 1.0000 "application " | MEDICATED_PAD | CUTANEOUS | Status: DC | PRN

## 2017-06-01 MED ORDER — IBUPROFEN 600 MG PO TABS
600.0000 mg | ORAL_TABLET | Freq: Four times a day (QID) | ORAL | Status: DC
  Administered 2017-06-01 – 2017-06-04 (×11): 600 mg via ORAL
  Filled 2017-06-01 (×11): qty 1

## 2017-06-01 MED ORDER — SOD CITRATE-CITRIC ACID 500-334 MG/5ML PO SOLN
30.0000 mL | ORAL | Status: AC
  Administered 2017-06-01: 30 mL via ORAL
  Filled 2017-06-01: qty 15

## 2017-06-01 MED ORDER — DIPHENHYDRAMINE HCL 50 MG/ML IJ SOLN
INTRAMUSCULAR | Status: AC
  Filled 2017-06-01: qty 1

## 2017-06-01 MED ORDER — LIDOCAINE HCL (CARDIAC) 20 MG/ML IV SOLN
INTRAVENOUS | Status: DC | PRN
  Administered 2017-06-01: 100 mg via INTRAVENOUS

## 2017-06-01 MED ORDER — SIMETHICONE 80 MG PO CHEW: 80.0000 mg | CHEWABLE_TABLET | ORAL | Status: DC | PRN

## 2017-06-01 MED ORDER — PROPOFOL 10 MG/ML IV BOLUS
INTRAVENOUS | Status: DC | PRN
  Administered 2017-06-01: 200 mg via INTRAVENOUS

## 2017-06-01 MED ORDER — DIPHENHYDRAMINE HCL 50 MG/ML IJ SOLN
12.5000 mg | Freq: Once | INTRAMUSCULAR | Status: AC
  Administered 2017-06-01: 12.5 mg via INTRAVENOUS

## 2017-06-01 MED ORDER — HYDROMORPHONE 1 MG/ML IV SOLN
INTRAVENOUS | Status: DC
  Administered 2017-06-01: 14:00:00 via INTRAVENOUS
  Administered 2017-06-02: 0.2 mg via INTRAVENOUS
  Filled 2017-06-01: qty 25

## 2017-06-01 SURGICAL SUPPLY — 50 items
ADH SKN CLS APL DERMABOND .7 (GAUZE/BANDAGES/DRESSINGS) ×1
APL SKNCLS STERI-STRIP NONHPOA (GAUZE/BANDAGES/DRESSINGS) ×1
BENZOIN TINCTURE PRP APPL 2/3 (GAUZE/BANDAGES/DRESSINGS) ×3 IMPLANT
BLADE TIP J-PLASMA PRECISE LAP (MISCELLANEOUS) ×3 IMPLANT
CHLORAPREP W/TINT 26ML (MISCELLANEOUS) ×3 IMPLANT
CLAMP CORD UMBIL (MISCELLANEOUS) IMPLANT
CLIP FILSHIE TUBAL LIGA STRL (Clip) ×2 IMPLANT
CLOSURE WOUND 1/2 X4 (GAUZE/BANDAGES/DRESSINGS) ×1
CLOTH BEACON ORANGE TIMEOUT ST (SAFETY) ×3 IMPLANT
DERMABOND ADVANCED (GAUZE/BANDAGES/DRESSINGS) ×2
DERMABOND ADVANCED .7 DNX12 (GAUZE/BANDAGES/DRESSINGS) IMPLANT
DRSG OPSITE POSTOP 4X10 (GAUZE/BANDAGES/DRESSINGS) ×3 IMPLANT
ELECT REM PT RETURN 9FT ADLT (ELECTROSURGICAL) ×3
ELECTRODE REM PT RTRN 9FT ADLT (ELECTROSURGICAL) ×1 IMPLANT
EXTRACTOR VACUUM KIWI (MISCELLANEOUS) IMPLANT
GAUZE SPONGE 4X4 12PLY STRL LF (GAUZE/BANDAGES/DRESSINGS) ×4 IMPLANT
GLOVE BIO SURGEON STRL SZ 6.5 (GLOVE) ×2 IMPLANT
GLOVE BIO SURGEON STRL SZ7 (GLOVE) ×3 IMPLANT
GLOVE BIO SURGEONS STRL SZ 6.5 (GLOVE) ×2
GLOVE BIOGEL PI IND STRL 6.5 (GLOVE) IMPLANT
GLOVE BIOGEL PI IND STRL 7.0 (GLOVE) ×2 IMPLANT
GLOVE BIOGEL PI INDICATOR 6.5 (GLOVE) ×4
GLOVE BIOGEL PI INDICATOR 7.0 (GLOVE) ×4
GOWN STRL REUS W/TWL LRG LVL3 (GOWN DISPOSABLE) ×6 IMPLANT
GOWN STRL REUS W/TWL XL LVL3 (GOWN DISPOSABLE) ×3 IMPLANT
KIT ABG SYR 3ML LUER SLIP (SYRINGE) IMPLANT
NDL HYPO 25X5/8 SAFETYGLIDE (NEEDLE) IMPLANT
NEEDLE HYPO 22GX1.5 SAFETY (NEEDLE) ×3 IMPLANT
NEEDLE HYPO 25X5/8 SAFETYGLIDE (NEEDLE) IMPLANT
NS IRRIG 1000ML POUR BTL (IV SOLUTION) ×3 IMPLANT
PACK C SECTION WH (CUSTOM PROCEDURE TRAY) ×3 IMPLANT
PAD ABD 7.5X8 STRL (GAUZE/BANDAGES/DRESSINGS) ×4 IMPLANT
PAD OB MATERNITY 4.3X12.25 (PERSONAL CARE ITEMS) ×3 IMPLANT
PENCIL SMOKE EVAC W/HOLSTER (ELECTROSURGICAL) ×3 IMPLANT
RTRCTR C-SECT PINK 25CM LRG (MISCELLANEOUS) IMPLANT
SPONGE LAP 18X18 X RAY DECT (DISPOSABLE) ×4 IMPLANT
SPONGE SURGIFOAM ABS GEL 12-7 (HEMOSTASIS) IMPLANT
STRIP CLOSURE SKIN 1/2X4 (GAUZE/BANDAGES/DRESSINGS) ×2 IMPLANT
SUT PDS AB 0 CTX 60 (SUTURE) IMPLANT
SUT PLAIN 0 NONE (SUTURE) IMPLANT
SUT PLAIN 2 0 (SUTURE) ×3
SUT PLAIN ABS 2-0 54XMFL TIE (SUTURE) IMPLANT
SUT SILK 0 TIES 10X30 (SUTURE) IMPLANT
SUT VIC AB 0 CT1 36 (SUTURE) ×9 IMPLANT
SUT VIC AB 3-0 CT1 27 (SUTURE) ×3
SUT VIC AB 3-0 CT1 TAPERPNT 27 (SUTURE) ×1 IMPLANT
SUT VIC AB 4-0 KS 27 (SUTURE) ×4 IMPLANT
SYR CONTROL 10ML LL (SYRINGE) ×3 IMPLANT
TOWEL OR 17X24 6PK STRL BLUE (TOWEL DISPOSABLE) ×3 IMPLANT
TRAY FOLEY BAG SILVER LF 14FR (SET/KITS/TRAYS/PACK) ×3 IMPLANT

## 2017-06-01 NOTE — Lactation Note (Signed)
This note was copied from a baby's chart. Lactation Consultation Note  Patient Name: Boy Jezel Basto [id number] Date: 06/01/2017 Reason for consult: Initial assessment Breastfeeding consultation services and support information given and reviewed.  Mom has breastfed her first two for 8 months.  Baby is showing feeding cues.   Mom shown hand expression and colostrum easily expressed.  Mom positioned baby in cradle hold.  Baby latched easily.  Good active suck/swallows.  Instructed to feed with any cue.  Encouraged to call for assist/concerns prn.  Maternal Data Has patient been taught Hand Expression?: Yes Does the patient have breastfeeding experience prior to this delivery?: Yes  Feeding Feeding Type: Breast Fed Length of feed: 20 min  LATCH Score/Interventions Latch: Grasps breast easily, tongue down, lips flanged, rhythmical sucking.  Audible Swallowing: Spontaneous and intermittent  Type of Nipple: Everted at rest and after stimulation  Comfort (Breast/Nipple): Soft / non-tender     Hold (Positioning): Assistance needed to correctly position infant at breast and maintain latch. Intervention(s): Breastfeeding basics reviewed;Support Pillows;Position options;Skin to skin  LATCH Score: 9  Lactation Tools Discussed/Used     Consult Status Consult Status: Follow-up Date: 05/26/17    Ave Filter 06/01/2017, 5:23 PM

## 2017-06-01 NOTE — Anesthesia Preprocedure Evaluation (Signed)
Anesthesia Evaluation  Patient identified by MRN, date of birth, ID band Patient awake    Reviewed: Allergy & Precautions, H&P , NPO status , Patient's Chart, lab work & pertinent test results  Airway Mallampati: II  TM Distance: >3 FB Neck ROM: full    Dental no notable dental hx. (+) Teeth Intact   Pulmonary neg pulmonary ROS,    Pulmonary exam normal breath sounds clear to auscultation       Cardiovascular negative cardio ROS Normal cardiovascular exam Rhythm:Regular Rate:Normal     Neuro/Psych negative psych ROS   GI/Hepatic negative GI ROS, Neg liver ROS,   Endo/Other  diabetes  Renal/GU negative Renal ROS     Musculoskeletal negative musculoskeletal ROS (+)   Abdominal Normal abdominal exam  (+) + obese,   Peds  Hematology negative hematology ROS (+)   Anesthesia Other Findings   Reproductive/Obstetrics negative OB ROS                             Anesthesia Physical  Anesthesia Plan  ASA: II  Anesthesia Plan: General   Post-op Pain Management:    Induction: Intravenous  PONV Risk Score and Plan: 4 or greater and Ondansetron, Dexamethasone, Propofol, Midazolam and Scopolamine patch - Pre-op  Airway Management Planned: Oral ETT and Video Laryngoscope Planned  Additional Equipment:   Intra-op Plan:   Post-operative Plan: Extubation in OR  Informed Consent: I have reviewed the patients History and Physical, chart, labs and discussed the procedure including the risks, benefits and alternatives for the proposed anesthesia with the patient or authorized representative who has indicated his/her understanding and acceptance.     Plan Discussed with: CRNA and Surgeon  Anesthesia Plan Comments:         Anesthesia Quick Evaluation

## 2017-06-01 NOTE — Op Note (Addendum)
Cesarean Section Operative Report  Eduardo Osier  PROCEDURE DATE: 06/01/2017  PREOPERATIVE DIAGNOSES: Intrauterine pregnancy at [redacted]w[redacted]d weeks gestation; malpresentation: breech; Undesired fertility; History of brain stem lesion  POSTOPERATIVE DIAGNOSES: The same  PROCEDURE: Primary Low Transverse Cesarean Section with bilateral tubal ligation using Filshie Clips under General anesthesia  SURGEON:   Surgeon(s) and Role:    * Lavonia Drafts, MD - Primary    * Mumaw, Lauralyn Primes, DO - Fellow    * Crissie Figures, MD - Fellow  ASSISTANT:  Katherine Basset, DO - OB Fellow             Degele, Almyra Free, MD - OB Fellow   INDICATIONS: Shelly Greene is a 36 y.o. [phone] at [redacted]w[redacted]d here for cesarean section secondary to the indications listed under preoperative diagnoses; please see preoperative note for further details.  The risks of cesarean section were discussed with the patient including but were not limited to: bleeding which may require transfusion or reoperation; infection which may require antibiotics; injury to bowel, bladder, ureters or other surrounding organs; injury to the fetus; need for additional procedures including hysterectomy in the event of a life-threatening hemorrhage; placental abnormalities wth subsequent pregnancies, incisional problems, thromboembolic phenomenon and other postoperative/anesthesia complications.   The patient concurred with the proposed plan, giving informed written consent for the procedure.    FINDINGS:  Viable female infant in cephalic presentation.  Apgars 6 and 9.  Clear amniotic fluid.  Intact placenta, three vessel cord.  4cm round fibroid in left cornua. Right ovary adhesed to posterior fundus. Normal fallopian tubes and ovaries bilaterally.  ANESTHESIA: General anesthesia INTRAVENOUS FLUIDS: 2700 ml ESTIMATED BLOOD LOSS: 600 ml URINE OUTPUT:  100 ml clear yellow SPECIMENS: Placenta sent to L&D COMPLICATIONS: None  immediate  PROCEDURE IN DETAIL:  The patient preoperatively received intravenous antibiotics and had sequential compression devices applied to her lower extremities. She was then taken to the operating room and placed in a dorsal supine position with a leftward tilt, and prepped and draped in a sterile manner.  A foley catheter was placed into her bladder and attached to constant gravity.  After an adequate timeout was performed, general anesthesia was then subsequently administered, and a Pfannenstiel skin incision was made with scalpel and carried through to the underlying layer of fascia. The fascia was incised in the midline, and this incision was extended bilaterally using the Mayo scissors.  Kocher clamps were applied to the superior aspect of the fascial incision and the underlying rectus muscles were dissected off bluntly.  A similar process was carried out on the inferior aspect of the fascial incision. The rectus muscles were separated in the midline bluntly and the peritoneum was entered bluntly. Attention was turned to the lower uterine segment where a low transverse hysterotomy was made with a scalpel and extended bilaterally bluntly.  The infant was successfully delivered in breech presentation in normal fashion, the cord was clamped and cut immediately, and the infant was handed over to the awaiting neonatology team. Manual extraction of the placenta was performed, and the placenta delivered intact with a three-vessel cord. The uterus was then cleared of clots and debris. The uterus was exteriorized for closure. The hysterotomy was closed with 0 Vicryl in a running locked fashion, and an imbricating layer was also placed with 0 Vicryl. The pelvis was cleared of all clot and debris. Hemostasis was confirmed on all surfaces.  The left fallopian tube was identified and followed out to the fimbriated end.  A  Filshie clip was placed on the left fallopian tube about 2 cm from the cornual attachment,  with care given to incorporate the underlying mesosalpinx.  A similar process was carried out on the right side allowing for bilateral tubal sterilization.  Good hemostasis was noted overall.  The peritoneum and the rectus muscles were reapproximated using 0 Vicryl with a single interrupted stitch. The fascia was then closed using 0 Vicryl in a running fashion.  The subcutaneous layer was irrigated, then reapproximated with 2-0 plain gut interrupted stitches, and 30 ml of 0.5% Marcaine was injected subcutaneously around the incision.  The skin was closed with a 4-0 Vicryl subcuticular stitch.   The patient tolerated the procedure well. Sponge, lap, instrument and needle counts were correct x 3.  She was taken to the recovery room in stable condition.     Disposition: PACU - hemodynamically stable.   Maternal Condition: stable    SignedKatherine Basset, DO 06/01/2017 11:54 AM

## 2017-06-01 NOTE — Anesthesia Procedure Notes (Signed)
Procedure Name: Intubation Date/Time: 06/01/2017 10:04 AM Performed by: Riki Sheer Pre-anesthesia Checklist: Patient identified, Emergency Drugs available, Suction available, Patient being monitored and Timeout performed Patient Re-evaluated:Patient Re-evaluated prior to induction Oxygen Delivery Method: Circle system utilized Preoxygenation: Pre-oxygenation with 100% oxygen Induction Type: IV induction and Rapid sequence Laryngoscope Size: Glidescope and 3 Grade View: Grade I Tube type: Oral Tube size: 7.0 mm Number of attempts: 1 Airway Equipment and Method: Video-laryngoscopy Placement Confirmation: ETT inserted through vocal cords under direct vision,  positive ETCO2,  CO2 detector and breath sounds checked- equal and bilateral Secured at: 22 cm Tube secured with: Tape Dental Injury: Teeth and Oropharynx as per pre-operative assessment

## 2017-06-01 NOTE — H&P (Addendum)
Obstetric Preoperative History and Physical  Shelly Greene is a 36 y.o. [id number] with IUP at [redacted]w[redacted]d presenting for presenting for scheduled primary cesarean section.  Breech presentation. Pt requests sterilization. Prenatal Course Source of Care: [place]  with onset of care at 1st trimester weeks Pregnancy complications or risks: Patient Active Problem List   Diagnosis Date Noted  . Breech presentation, antepartum 06/01/2017  . Malpresentation before onset of labor 05/30/2017  . Obesity (BMI 30.0-34.9) 01/18/2017  . Obesity affecting pregnancy in second trimester 01/18/2017  . Hypothyroidism during pregnancy, antepartum 11/08/2016  . Supervision of high risk pregnancy in second trimester 11/08/2016  . History of gestational diabetes 11/08/2016  . Advanced maternal age in multigravida 11/08/2016  . 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 CARPAL TUNNEL SYNDROME 01/18/2007   She plans to breastfeed She desires bilateral tubal ligation for postpartum contraception.   Prenatal labs and studies: ABO, Rh: --/--/B POS (07/12 0825) Antibody: NEG (07/12 0825) Rubella: 20.00 (12/19 1052) RPR: Non Reactive (04/25 0831)  HBsAg: NEGATIVE (12/19 1052)  HIV: Non Reactive (04/25 0831)  AVW:UJWJXBJY (06/18 1335) 2 hr Glucola WNL Genetic screening normal Anatomy US normal  Prenatal Transfer Tool  Maternal Diabetes: No Genetic Screening: Normal Maternal Ultrasounds/Referrals: Normal Fetal Ultrasounds or other Referrals:  None Maternal Substance Abuse:  No Significant Maternal Medications:  None Significant Maternal Lab Results: None  Past Medical History:  Diagnosis Date  . Brain stem lesion    Being followed [facility]  . Gestational diabetes   . Hypothyroidism   . PILAR CYST 02/27/2007   Qualifier: Diagnosis of  By: Walker Kehr MD, Patrick Jupiter    . 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 Vitamin D deficiency     Past Surgical History:  Procedure Laterality Date  . DILATION AND EVACUATION N/A  02/13/2015   Procedure: DILATATION AND EVACUATION;  Surgeon: Truett Mainland, DO;  Location: [facility];  Service: Gynecology;  Laterality: N/A;  . EYE SURGERY      OB History  Gravida Para Term Preterm AB Living  5 2 2   2 2   SAB TAB Ectopic Multiple Live Births  2       2    # Outcome Date GA Lbr Len/2nd Weight Sex Delivery Anes PTL Lv  5 Current           4 Term 07/10/09   7 lb 4 oz (3.289 kg) F Vag-Spont   LIV  3 Term 08/14/04   7 lb 14 oz (3.572 kg) M Vag-Spont   LIV  2 SAB           1 SAB               Social History   Social History  . Marital status: Married    Spouse name: N/A  . Number of children: N/A  . Years of education: N/A   Social History Main Topics  . Smoking status: Never Smoker  . Smokeless tobacco: Never Used  . Alcohol use No  . Drug use: No  . Sexual activity: Yes    Birth control/ protection: None   Other Topics Concern  . None   Social History Narrative  . None    Family History  Problem Relation Age of Onset  . Prostate cancer Father     Prescriptions Prior to Admission  Medication Sig Dispense Refill Last Dose  . levothyroxine (SYNTHROID, LEVOTHROID) 50 MCG tablet Take 1 tablet (50 mcg total) by mouth daily before breakfast. (Patient taking  differently: Take 50 mcg by mouth daily before breakfast. 1 hour before breakfast) 60 tablet 5 Taking  . Prenatal Vit-Fe Fumarate-FA (PRENATAL MULTIVITAMIN) TABS tablet Take 1 tablet by mouth daily at 12 noon.   Taking    No Known Allergies  Review of Systems: Negative except for what is mentioned in HPI.  Physical Exam: BP 115/72 (BP Location: Left Arm)   Pulse 74   Temp 97.9 F (36.6 C) (Oral)   Resp 18   Ht 5\' 3"  (1.6 m)   Wt 175 lb (79.4 kg)   LMP 08/27/2016   BMI 31.00 kg/m  FHR by Doppler: + bpm GENERAL: Well-developed, well-nourished female in no acute distress.  LUNGS: Clear to auscultation bilaterally.  HEART: Regular rate and rhythm. ABDOMEN: Soft, nontender, nondistended,  gravid PELVIC: Deferred EXTREMITIES: Nontender, no edema, 2+ distal pulses. Bedside US confirmed breech   Pertinent Labs/Studies:   Results for orders placed or performed during the hospital encounter of 06/01/17 (from the past 72 hour(s))  CBC     Status: Abnormal   Collection Time: 06/01/17  8:25 AM  Result Value Ref Range   WBC 7.9 4.0 - 10.5 K/uL   RBC 4.28 3.87 - 5.11 MIL/uL   Hemoglobin 12.7 12.0 - 15.0 g/dL   HCT 36.8 36.0 - 46.0 %   MCV 86.0 78.0 - 100.0 fL   MCH 29.7 26.0 - 34.0 pg   MCHC 34.5 30.0 - 36.0 g/dL   RDW 13.7 11.5 - 15.5 %   Platelets 136 (L) 150 - 400 K/uL  Type and screen [place]     Status: None   Collection Time: 06/01/17  8:25 AM  Result Value Ref Range   ABO/RH(D) B POS    Antibody Screen NEG    Sample Expiration 06/04/2017     Assessment and Plan :Shelly Greene is a 36 y.o. [id number] at [redacted]w[redacted]d being admitted being admitted for scheduled cesarean section for breech presentation with bilateral tubal ligation. The risks of cesarean section discussed with the patient included but were not limited to: bleeding which may require transfusion or reoperation; infection which may require antibiotics; injury to bowel, bladder, ureters or other surrounding organs; injury to the fetus; need for additional procedures including hysterectomy in the event of a life-threatening hemorrhage; placental abnormalities wth subsequent pregnancies, incisional problems, thromboembolic phenomenon and other postoperative/anesthesia complications. The patient concurred with the proposed plan, giving informed written consent for the procedure. Patient has been NPO since last night she will remain NPO for procedure. Anesthesia and OR aware. Preoperative prophylactic antibiotics and SCDs ordered on call to the OR. To OR when ready.   Addendum: Dr. Joya Salm, Neurosurgeon who referred patient to [facility], Wynetta Emery, who reported stable MRI 11/29/12 and  improved dizziness/ataxia/nystagmus symptoms. follow up planned in one year. Report from 12/06/2012 scanned.  [x]  needs NSU consult 3/12: Patient has been referred to [facility] in [facility]. Referral and med records have been faxed.  3/21: [facility] has not been able to reach patient to complete Neuro referral.  03/2017: saw [facility]. NTD. Repeat MRI one year  Shelly Greene L. Harraway-Smith, M.D., Cherlynn June

## 2017-06-01 NOTE — Anesthesia Postprocedure Evaluation (Signed)
Anesthesia Post Note  Patient: Shelly Greene  Procedure(s) Performed: Procedure(s) (LRB): CESAREAN SECTION  Patient has Brain Stem Lesion, will require General anesthesia (N/A)     Patient location during evaluation: PACU Anesthesia Type: General Level of consciousness: awake and sedated Pain management: pain level controlled Vital Signs Assessment: post-procedure vital signs reviewed and stable Respiratory status: spontaneous breathing Cardiovascular status: stable Postop Assessment: no signs of nausea or vomiting Anesthetic complications: no    Last Vitals:  Vitals:   06/01/17 1145 06/01/17 1200  BP: 123/84 130/88  Pulse: 65 62  Resp: 16 12  Temp:      Last Pain:  Vitals:   06/01/17 1200  TempSrc:   PainSc: 7    Pain Goal:                 Garrell Flagg JR,JOHN Newton Frutiger

## 2017-06-01 NOTE — Transfer of Care (Signed)
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

## 2017-06-02 DIAGNOSIS — Z3A39 39 weeks gestation of pregnancy: Secondary | ICD-10-CM

## 2017-06-02 DIAGNOSIS — O321XX0 Maternal care for breech presentation, not applicable or unspecified: Secondary | ICD-10-CM

## 2017-06-02 LAB — CBC
HCT: 28.4 % — ABNORMAL LOW (ref 36.0–46.0)
Hemoglobin: 10 g/dL — ABNORMAL LOW (ref 12.0–15.0)
MCH: 29.9 pg (ref 26.0–34.0)
MCHC: 35.2 g/dL (ref 30.0–36.0)
MCV: 84.8 fL (ref 78.0–100.0)
Platelets: 129 10*3/uL — ABNORMAL LOW (ref 150–400)
RBC: 3.35 MIL/uL — ABNORMAL LOW (ref 3.87–5.11)
RDW: 13.6 % (ref 11.5–15.5)
WBC: 12.9 10*3/uL — ABNORMAL HIGH (ref 4.0–10.5)

## 2017-06-02 MED ORDER — OXYCODONE-ACETAMINOPHEN 5-325 MG PO TABS
2.0000 | ORAL_TABLET | ORAL | Status: DC | PRN
  Administered 2017-06-02 (×2): 2 via ORAL
  Filled 2017-06-02 (×2): qty 2

## 2017-06-02 NOTE — Progress Notes (Signed)
POSTPARTUM PROGRESS NOTE  Post Partum/POD #1 s/p PLTCS for breech presentation and BTL.  Subjective:  Shelly Greene is a 36 y.o. [id number] [redacted]w[redacted]d s/p PLTCS/BTL.  No acute events overnight.  Pt denies problems with ambulating, voiding or po intake.  She denies nausea or vomiting.  Pain is well controlled.  She has had flatus. Foley is still in place. Lochia Minimal.   Objective: Blood pressure 105/62, pulse 61, temperature 98.4 F (36.9 C), temperature source Oral, resp. rate 20, height 5\' 3"  (1.6 m), weight 175 lb (79.4 kg), last menstrual period 08/27/2016, SpO2 99 %, unknown if currently breastfeeding.  Physical Exam:  General: alert, cooperative and no distress Lochia:normal flow Chest: no respiratory distress Heart:regular rate, distal pulses intact Abdomen: soft, nontender. Uterine Fundus: firm, appropriately tender. DVT Evaluation: No calf swelling or tenderness Extremities: no edema Skin: incision with dressing in place clean and dry   Recent Labs  06/01/17 0825 06/02/17 0526  HGB 12.7 10.0*  HCT 36.8 28.4*    Assessment/Plan:  ASSESSMENT: Shelly Greene is a 36 y.o. [id number] [redacted]w[redacted]d s/p PLTCS. Doing well. Foley being removed this afternoon.   Plan for discharge POD2/3. Breasfeeding Contraception: s/p BTL   LOS: 1 day   Jenne Pane DegeleMD 06/02/2017, 5:06 PM

## 2017-06-02 NOTE — Progress Notes (Signed)
UR chart review completed.  

## 2017-06-02 NOTE — Lactation Note (Signed)
This note was copied from a baby's chart. Lactation Consultation Note  Patient Name: Shelly Greene [id number] Date: 06/02/2017 Reason for consult: Follow-up assessment Baby at 28 hr of life with 8 bf, 7 wets, and 3 stools in the last 24 hr along with a 5.1% wt at 20 hr of life. Mom has easily expressed colostrum and baby tolerated spoon feeding. Mom is reporting bilateral sore nipples, no skin break down or bruising was noted at this time. Baby has a high palate, a noticeable anterior lingual frenulum, baby can NOT lift tongue to midline, can NOT extend tongue over the gum ridge, has POOR lateralization of tongue, has a choppy suck at the breast, and uncoordinated suck with a gloved finger after suck training. Mom is concerned that baby is eating often but does not seem to be getting full. Discussed the risks of formula. Mom was agreeable to latching on demand 8+/24hr, post expressing, and spoon feeding. Discussed baby behavior, feeding frequency, pumping, supplementing volume guidelines, baby belly size, voids, wt loss, breast changes, and nipple care.   Maternal Data    Feeding Feeding Type: Breast Fed Length of feed: 10 min  LATCH Score/Interventions Latch: Grasps breast easily, tongue down, lips flanged, rhythmical sucking.  Audible Swallowing: A few with stimulation Intervention(s): Hand expression  Type of Nipple: Everted at rest and after stimulation  Comfort (Breast/Nipple): Filling, red/small blisters or bruises, mild/mod discomfort  Problem noted: Mild/Moderate discomfort Interventions (Mild/moderate discomfort): Hand expression  Hold (Positioning): Assistance needed to correctly position infant at breast and maintain latch. Intervention(s): Position options;Support Pillows  LATCH Score: 7  Lactation Tools Discussed/Used Pump Review: Setup, frequency, and cleaning;Milk Storage Initiated by:: ES Date initiated:: 06/03/17   Consult Status Consult Status:  Follow-up Date: 06/03/17 Follow-up type: In-patient    Denzil Hughes 06/02/2017, 2:31 PM

## 2017-06-02 NOTE — Progress Notes (Signed)
Patient tolerated OOB well with slight dizziness. Assistance provided as patient ambulated to bathroom and while in the bathroom. Patient reports that due to her brain stem lesion, she has some right-sided weakness and vertigo. Patient instructed to call for assistance to bathroom. PCA Dilaudid was discontinued. Patient denies pain and has not given herself any medication in the last 8 hours.

## 2017-06-03 MED ORDER — IBUPROFEN 600 MG PO TABS: 600.0000 mg | ORAL_TABLET | Freq: Four times a day (QID) | ORAL | 0 refills | Status: DC | PRN

## 2017-06-03 MED ORDER — OXYCODONE-ACETAMINOPHEN 5-325 MG PO TABS: 2.0000 | ORAL_TABLET | ORAL | 0 refills | Status: DC | PRN

## 2017-06-03 NOTE — Lactation Note (Signed)
This note was copied from a baby's chart. Lactation Consultation Note  Patient Name: Shelly Greene [id number] Date: 06/03/2017 Reason for consult: Follow-up assessment   Baby cueing and mother lying on her side.  Assisted w/ latching baby. Sucks and swallows observed. Encouraged mother to compress her breast to keep baby active. Mother states she plans to post pump a few times a day in addition to breastfeeding and will give baby back volume pumped.     Maternal Data    Feeding Feeding Type: Breast Fed  LATCH Score/Interventions Latch: Grasps breast easily, tongue down, lips flanged, rhythmical sucking.  Audible Swallowing: Spontaneous and intermittent  Type of Nipple: Everted at rest and after stimulation  Comfort (Breast/Nipple): Soft / non-tender  Problem noted: Mild/Moderate discomfort  Hold (Positioning): Assistance needed to correctly position infant at breast and maintain latch.  LATCH Score: 9  Lactation Tools Discussed/Used     Consult Status Consult Status: Follow-up Date: 06/04/17 Follow-up type: In-patient    Vivianne Master [facility] 06/03/2017, 2:49 PM

## 2017-06-03 NOTE — Discharge Summary (Signed)
OB Discharge Summary     Patient Name: Shelly Greene DOB: 1981-06-08 MRN: [id number]  Date of admission: 06/01/2017 Delivering MD: Lavonia Drafts   Date of discharge: 06/03/2017  Admitting diagnosis: Primary cesarean for Breech presentation and Aaron Edelman Stem Lesion Intrauterine pregnancy: [redacted]w[redacted]d     Secondary diagnosis:  Principal Problem:   Breech presentation, antepartum Active Problems:   Brain stem lesion   Hypothyroidism during pregnancy, antepartum   Supervision of high risk pregnancy in second trimester   History of gestational diabetes   Advanced maternal age in multigravida   Obesity affecting pregnancy in second trimester   Status post primary low transverse cesarean section  Additional problems: none     Discharge diagnosis: Term Pregnancy Delivered; rLTCS and BTL                                                                                                Post partum procedures:none  Augmentation: N/A  Complications: None  Hospital course:  Sceduled C/S   36 y.o. yo [id number] at [redacted]w[redacted]d was admitted to the hospital 06/01/2017 for scheduled cesarean section with the following indication:Elective Repeat and Malpresentation.  Membrane Rupture Time/Date: 10:06 AM ,06/01/2017   Patient delivered a Viable infant.06/01/2017  Details of operation can be found in separate operative note.  Pateint had an uncomplicated postpartum course.  She is ambulating, tolerating a regular diet, passing flatus, and urinating well. Patient is discharged home in stable condition on  06/03/17. Pt is concerned about a sm raised bump on her right inner breast. States she squeezed it and blood came out and now it seems larger. Will have Fam Prac eval prior to d/c.         Physical exam  Vitals:   06/02/17 0943 06/02/17 1342 06/02/17 1856 06/03/17 0500  BP:   110/66 (!) 98/58  Pulse:   73 65  Resp: (!) 21 20 18 16   Temp:   (!) 97.5 F (36.4 C) 98.9 F (37.2 C)  TempSrc:   Oral  Oral  SpO2: 96% 99%    Weight:      Height:       General: alert and cooperative  Breast: right inner aspect, sm 0.25cm raised bump, red Lochia: appropriate Uterine Fundus: firm Incision: honey intact, clean DVT Evaluation: No evidence of DVT seen on physical exam. Labs: Lab Results  Component Value Date   WBC 12.9 (H) 06/02/2017   HGB 10.0 (L) 06/02/2017   HCT 28.4 (L) 06/02/2017   MCV 84.8 06/02/2017   PLT 129 (L) 06/02/2017   CMP Latest Ref Rng & Units 03/15/2017  Glucose 65 - 99 mg/dL 152(H)  BUN 6 - 20 mg/dL 7  Creatinine 0.57 - 1.00 mg/dL 0.52(L)  Sodium 134 - 144 mmol/L 137  Potassium 3.5 - 5.2 mmol/L 3.9  Chloride 96 - 106 mmol/L 98  CO2 18 - 29 mmol/L 22  Calcium 8.7 - 10.2 mg/dL 8.9  Total Protein 6.0 - 8.5 g/dL 6.3  Total Bilirubin 0.0 - 1.2 mg/dL 0.5  Alkaline Phos 39 - 117 IU/L 80  AST 0 - 40 IU/L  25  ALT 0 - 32 IU/L 19    Discharge instruction: per After Visit Summary and "Baby and Me Booklet".  After visit meds:  Allergies as of 06/03/2017   No Known Allergies     Medication List    TAKE these medications   ibuprofen 600 MG tablet Commonly known as:  ADVIL,MOTRIN Take 1 tablet (600 mg total) by mouth every 6 (six) hours as needed.   levothyroxine 50 MCG tablet Commonly known as:  SYNTHROID, LEVOTHROID Take 1 tablet (50 mcg total) by mouth daily before breakfast. What changed:  additional instructions   oxyCODONE-acetaminophen 5-325 MG tablet Commonly known as:  PERCOCET/ROXICET Take 2 tablets by mouth every 4 (four) hours as needed for moderate pain.   prenatal multivitamin Tabs tablet Take 1 tablet by mouth daily at 12 noon.       Diet: routine diet  Activity: Advance as tolerated. Pelvic rest for 6 weeks.   Outpatient follow up:4 weeks Follow up Appt:No future appointments. Follow up Visit:No Follow-up on file.  Postpartum contraception: Tubal Ligation  Newborn Data: Live born female  Birth Weight: 7 lb 8.6 oz (3420 g) APGAR:  6, 9  Baby Feeding: Breast Disposition:home with mother   06/03/2017 Serita Grammes, CNM  7:18 AM

## 2017-06-03 NOTE — Progress Notes (Signed)
Patient informed nurse that she now does not want to go home today, and wants to stay til tomorrow for benefit of baby's  feedings.  Complains of feeling weak, although she just has taken shower and has been up and about in her room without complaint.  Faculty practice notified and discharge order cancelled.  Will monitor patient for any other concerns.

## 2017-06-04 NOTE — Discharge Summary (Signed)
OB Discharge Summary  Patient Name: Shelly Greene DOB: Jul 28, 1981 MRN: [id number]  Date of admission: 06/01/2017 Delivering MD: Lavonia Drafts   Date of discharge: 06/04/2017  Admitting diagnosis: Primary cesarean for Breech presentation and Aaron Edelman Stem Lesion Intrauterine pregnancy: [redacted]w[redacted]d     Secondary diagnosis:Principal Problem:   Breech presentation, antepartum Active Problems:   Brain stem lesion   Hypothyroidism during pregnancy, antepartum   Supervision of high risk pregnancy in second trimester   History of gestational diabetes   Advanced maternal age in multigravida   Obesity affecting pregnancy in second trimester   Status post primary low transverse cesarean section  Additional problems:none     Discharge diagnosis: Term Pregnancy Delivered                                                                     Post partum procedures:n/a  Augmentation: n/a  Complications: None  Hospital course:  Sceduled C/S   36 y.o. yo [id number] at [redacted]w[redacted]d was admitted to the hospital 06/01/2017 for scheduled cesarean section with the following indication:Malpresentation.  Membrane Rupture Time/Date: 10:06 AM ,06/01/2017   Patient delivered a Viable infant.06/01/2017  Details of operation can be found in separate operative note.  Pateint had an uncomplicated postpartum course.  She is ambulating, tolerating a regular diet, passing flatus, and urinating well. Patient is discharged home in stable condition on  06/04/17         Physical exam  Vitals:   06/02/17 1856 06/03/17 0500 06/03/17 1856 06/04/17 0545  BP: 110/66 (!) 98/58 (!) 104/56 111/71  Pulse: 73 65 67 66  Resp: 18 16 16 16   Temp: (!) 97.5 F (36.4 C) 98.9 F (37.2 C) 99.2 F (37.3 C) 98 F (36.7 C)  TempSrc: Oral Oral Oral Oral  SpO2:    98%  Weight:      Height:       General: alert, cooperative and no distress Lochia: appropriate Uterine Fundus: firm Incision: Healing well with no significant  drainage, No significant erythema, Dressing is clean, dry, and intact DVT Evaluation: No evidence of DVT seen on physical exam. Labs: Lab Results  Component Value Date   WBC 12.9 (H) 06/02/2017   HGB 10.0 (L) 06/02/2017   HCT 28.4 (L) 06/02/2017   MCV 84.8 06/02/2017   PLT 129 (L) 06/02/2017   CMP Latest Ref Rng & Units 03/15/2017  Glucose 65 - 99 mg/dL 152(H)  BUN 6 - 20 mg/dL 7  Creatinine 0.57 - 1.00 mg/dL 0.52(L)  Sodium 134 - 144 mmol/L 137  Potassium 3.5 - 5.2 mmol/L 3.9  Chloride 96 - 106 mmol/L 98  CO2 18 - 29 mmol/L 22  Calcium 8.7 - 10.2 mg/dL 8.9  Total Protein 6.0 - 8.5 g/dL 6.3  Total Bilirubin 0.0 - 1.2 mg/dL 0.5  Alkaline Phos 39 - 117 IU/L 80  AST 0 - 40 IU/L 25  ALT 0 - 32 IU/L 19    Discharge instruction: per After Visit Summary and "Baby and Me Booklet".  After Visit Meds:  Allergies as of 06/04/2017   No Known Allergies     Medication List    TAKE these medications   ibuprofen 600 MG tablet Commonly known as:  ADVIL,MOTRIN  Take 1 tablet (600 mg total) by mouth every 6 (six) hours as needed.   levothyroxine 50 MCG tablet Commonly known as:  SYNTHROID, LEVOTHROID Take 1 tablet (50 mcg total) by mouth daily before breakfast. What changed:  additional instructions   oxyCODONE-acetaminophen 5-325 MG tablet Commonly known as:  PERCOCET/ROXICET Take 2 tablets by mouth every 4 (four) hours as needed for moderate pain.   prenatal multivitamin Tabs tablet Take 1 tablet by mouth daily at 12 noon.       Diet: routine diet  Activity: Advance as tolerated. Pelvic rest for 6 weeks.   Outpatient follow up:6 weeks Follow up Appt:No future appointments. Follow up visit: No Follow-up on file.  Postpartum contraception: Undecided  Newborn Data: Live born female  Birth Weight: 7 lb 8.6 oz (3420 g) APGAR: 6, 9  Baby Feeding: Breast Disposition:home with mother   06/04/2017 Koren Shiver, CNM

## 2017-06-04 NOTE — Lactation Note (Signed)
This note was copied from a baby's chart. Lactation Consultation Note  Baby 40 hours old and mother's breasts are filling. Baby sleeping after recent feeding. She applied ice and pumped approx 60 ml. Discussed milk storage. Mother denies problems or concerns with breastfeeding. Mom encouraged to feed baby 8-12 times/24 hours and with feeding cues.   Reviewed engorgement care and monitoring voids/stools.   Patient Name: Shelly Greene [id number] Date: 06/04/2017 Reason for consult: Follow-up assessment   Maternal Data    Feeding Feeding Type: Breast Fed Length of feed: 10 min  LATCH Score/Interventions Latch: Grasps breast easily, tongue down, lips flanged, rhythmical sucking.  Audible Swallowing: Spontaneous and intermittent  Type of Nipple: Everted at rest and after stimulation  Comfort (Breast/Nipple): Filling, red/small blisters or bruises, mild/mod discomfort  Problem noted: Mild/Moderate discomfort  Hold (Positioning): Assistance needed to correctly position infant at breast and maintain latch.  LATCH Score: 8  Lactation Tools Discussed/Used     Consult Status Consult Status: Complete    [name] 06/04/2017, 9:28 AM

## 2017-06-05 ENCOUNTER — Encounter: Payer: Self-pay | Admitting: Obstetrics and Gynecology

## 2017-06-30 NOTE — Progress Notes (Signed)
Post Partum Exam  Shelly Greene is a 36 y.o. [phone] female who presents for a postpartum visit. She is 4 weeks postpartum following a low cervical transverse Cesarean section. I have fully reviewed the prenatal and intrapartum course. The delivery was at 39.5 gestational weeks.  Anesthesia: general. Postpartum course has been unremarkable. Baby's course has been unremarkable. Baby is feeding by breast. Bleeding no bleeding. Bowel function is normal. Bladder function is normal. Patient is not sexually active. Contraception method is tubal ligation. Postpartum depression screening:neg

## 2017-07-05 ENCOUNTER — Encounter: Payer: Self-pay | Admitting: Obstetrics and Gynecology

## 2017-07-05 ENCOUNTER — Ambulatory Visit (INDEPENDENT_AMBULATORY_CARE_PROVIDER_SITE_OTHER): Payer: Self-pay | Admitting: Obstetrics and Gynecology

## 2017-07-05 NOTE — Progress Notes (Signed)
Obstetrics Visit Postpartum Visit  Appointment Date: 07/05/2017  OBGYN Clinic: [facility]  Primary Care Provider: Patient, No Pcp Per  Chief Complaint:  Chief Complaint  Patient presents with  . Postpartum Care    History of Present Illness: Shelly Greene is a 36 y.o. Hispanic [id number] (No LMP recorded.), seen for the above chief complaint.   She is s/p rpt c/s and BTL on 7/12; she was discharged to home on POD#3. Pt is amenorrheic  Vaginal bleeding or discharge: No  Breast or formula feeding: breast PP depression s/s: No  Any bowel or bladder issues: No  Pap smear: no abnormalities/HPV neg (date: 2015)  Review of Systems: as noted in the History of Present Illness.   Medications Ms. Correa-Chavez had no medications administered during this visit. Current Outpatient Prescriptions  Medication Sig Dispense Refill  . levothyroxine (SYNTHROID, LEVOTHROID) 50 MCG tablet Take 1 tablet (50 mcg total) by mouth daily before breakfast. (Patient taking differently: Take 50 mcg by mouth daily before breakfast. 1 hour before breakfast) 60 tablet 5   No current facility-administered medications for this visit.     Allergies Patient has no known allergies.  Physical Exam:  BP 93/63   Pulse 75   Wt 153 lb (69.4 kg)   BMI 27.10 kg/m  Body mass index is 27.1 kg/m. General appearance: Well nourished, well developed female in no acute distress.  Cardiovascular: normal s1 and s2.  No murmurs, rubs or gallops. Respiratory:  Clear to auscultation bilateral. Normal respiratory effort Abdomen: positive bowel sounds and no masses, hernias; diffusely non tender to palpation, non distended. C/d/i incision Neuro/Psych:  Normal mood and affect.  Skin:  Warm and dry.   Laboratory: none  PP Depression Screening:  3, #10 never  Assessment: pt doing well  Plan: routine PP visit. No issues.   RTC PRN  Durene Romans MD Attending Center for  [employer])

## 2017-09-22 ENCOUNTER — Emergency Department (HOSPITAL_COMMUNITY)
Admission: EM | Admit: 2017-09-22 | Discharge: 2017-09-22 | Disposition: A | Discharge status: Home / Self Care | Payer: No Typology Code available for payment source | Attending: Emergency Medicine | Admitting: Emergency Medicine

## 2017-09-22 ENCOUNTER — Emergency Department (HOSPITAL_COMMUNITY): Payer: No Typology Code available for payment source

## 2017-09-22 ENCOUNTER — Encounter (HOSPITAL_COMMUNITY): Payer: Self-pay | Admitting: Emergency Medicine

## 2017-09-22 DIAGNOSIS — Z79899 Other long term (current) drug therapy: Secondary | ICD-10-CM | POA: Insufficient documentation

## 2017-09-22 DIAGNOSIS — S199XXA Unspecified injury of neck, initial encounter: Secondary | ICD-10-CM | POA: Diagnosis present

## 2017-09-22 DIAGNOSIS — E039 Hypothyroidism, unspecified: Secondary | ICD-10-CM | POA: Diagnosis not present

## 2017-09-22 DIAGNOSIS — Y9389 Activity, other specified: Secondary | ICD-10-CM | POA: Insufficient documentation

## 2017-09-22 DIAGNOSIS — Y929 Unspecified place or not applicable: Secondary | ICD-10-CM | POA: Insufficient documentation

## 2017-09-22 DIAGNOSIS — R51 Headache: Secondary | ICD-10-CM | POA: Insufficient documentation

## 2017-09-22 DIAGNOSIS — Y999 Unspecified external cause status: Secondary | ICD-10-CM | POA: Diagnosis not present

## 2017-09-22 DIAGNOSIS — S161XXA Strain of muscle, fascia and tendon at neck level, initial encounter: Secondary | ICD-10-CM | POA: Insufficient documentation

## 2017-09-22 MED ORDER — CYCLOBENZAPRINE HCL 10 MG PO TABS
10.0000 mg | ORAL_TABLET | Freq: Once | ORAL | Status: AC
  Administered 2017-09-22: 10 mg via ORAL
  Filled 2017-09-22: qty 1

## 2017-09-22 MED ORDER — KETOROLAC TROMETHAMINE 60 MG/2ML IM SOLN
60.0000 mg | Freq: Once | INTRAMUSCULAR | Status: AC
  Administered 2017-09-22: 60 mg via INTRAMUSCULAR
  Filled 2017-09-22: qty 2

## 2017-09-22 MED ORDER — CYCLOBENZAPRINE HCL 10 MG PO TABS: 10.0000 mg | ORAL_TABLET | Freq: Two times a day (BID) | ORAL | 0 refills | Status: DC | PRN

## 2017-09-22 NOTE — ED Notes (Signed)
Patient verbalized understanding of discharge instructions and denies any further needs or questions at this time. VS stable. Patient ambulatory with steady gait. Escorted to ED entrance in wheelchair.   
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
## 2017-10-12 IMAGING — US US OB TRANSVAGINAL
1 series · 15 of 28 positions shown · non-contrast
Comparison: None.

CLINICAL DATA: Vaginal bleeding

EXAM:
OBSTETRIC <14 WK US AND TRANSVAGINAL OB US
TECHNIQUE: Both transabdominal and transvaginal ultrasound examinations were
performed for complete evaluation of the gestation as well as the
maternal uterus, adnexal regions, and pelvic cul-de-sac.
Transvaginal technique was performed to assess early pregnancy.

[Series 1: us ob transvaginal · 15 of 53 slices shown]
[im 1/53]
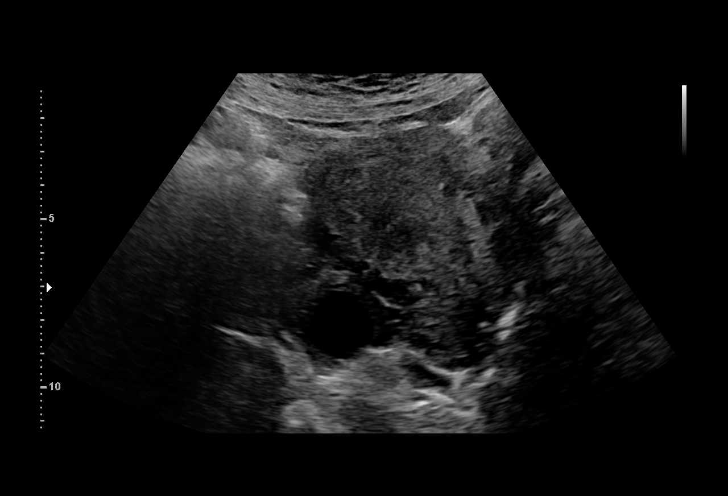
[im 4/53]
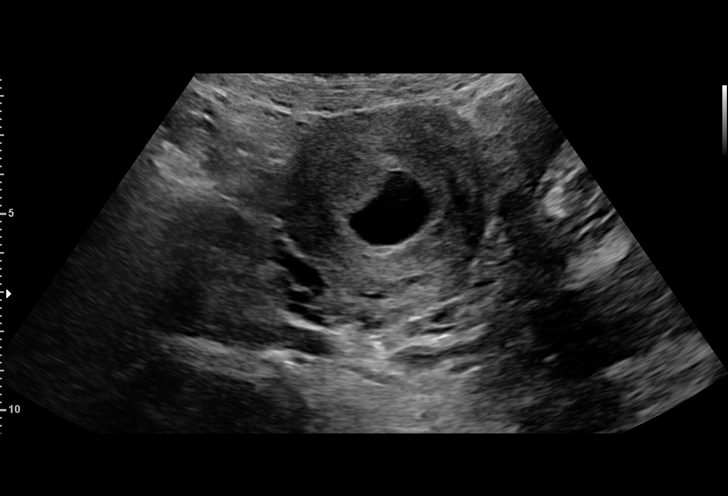
[im 8/53]
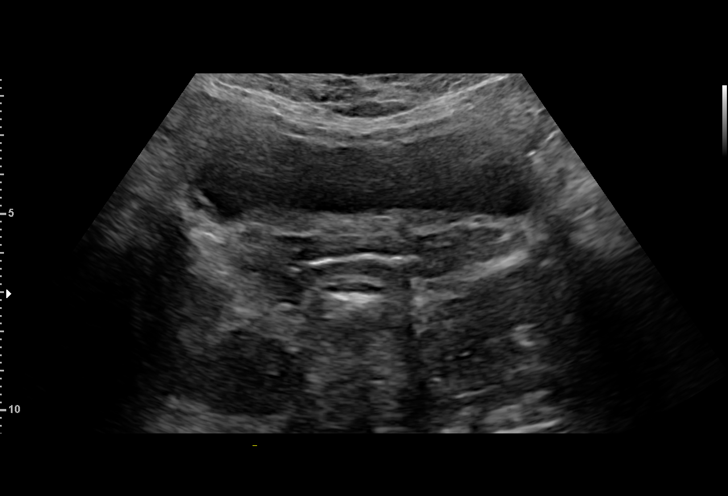
[im 12/53]
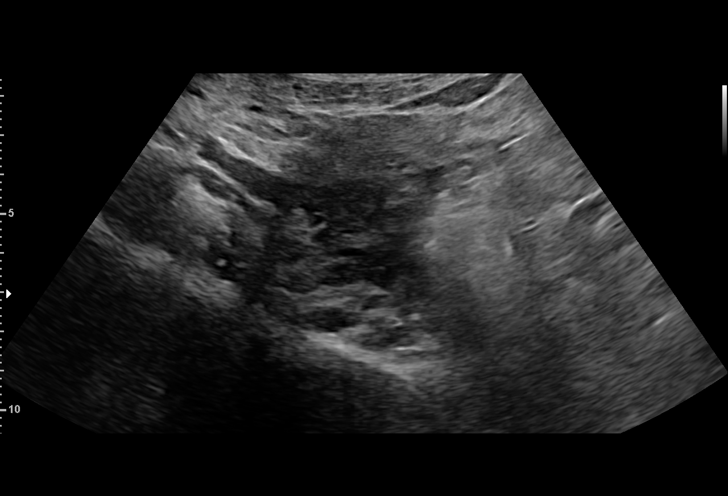
[im 16/53]
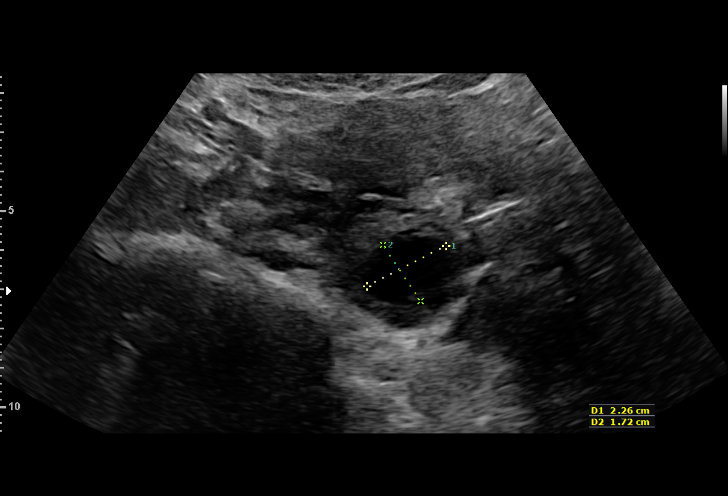
[im 20/53]
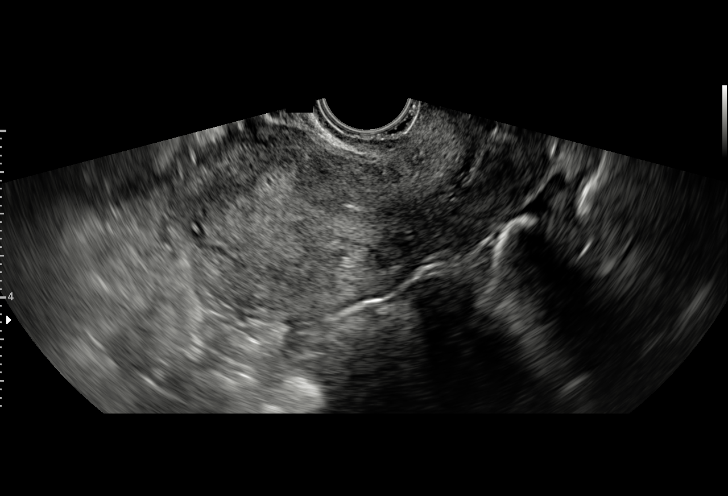
[im 24/53]
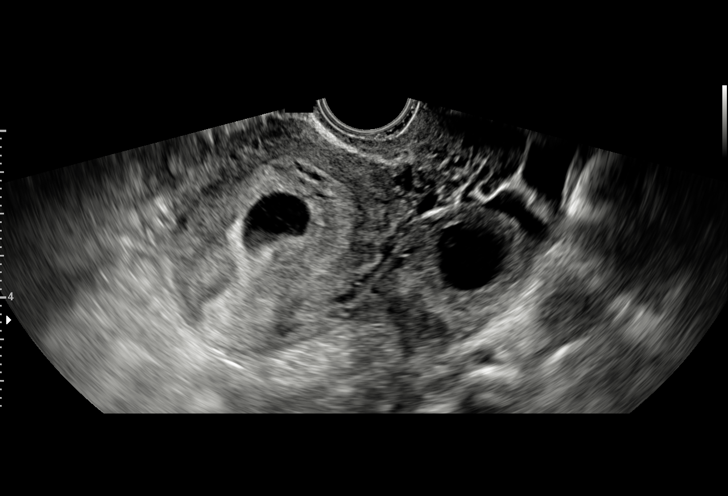
[im 27/53]
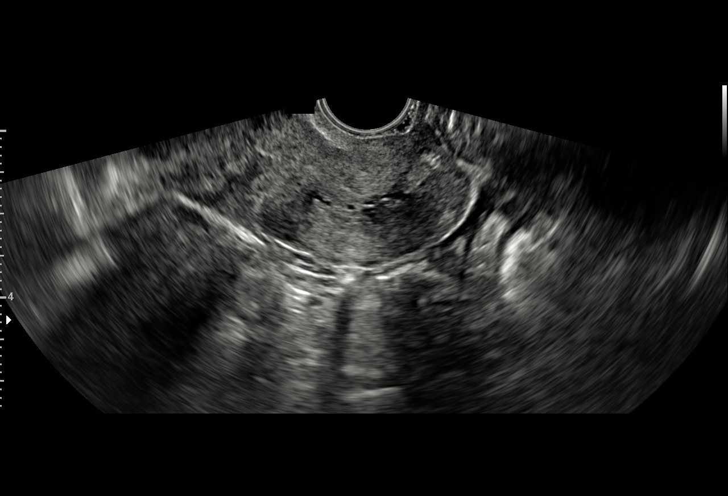
[im 29/53]
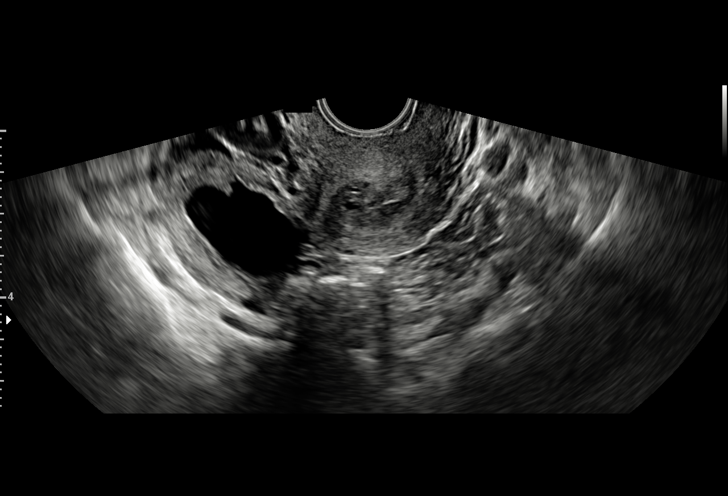
[im 33/53]
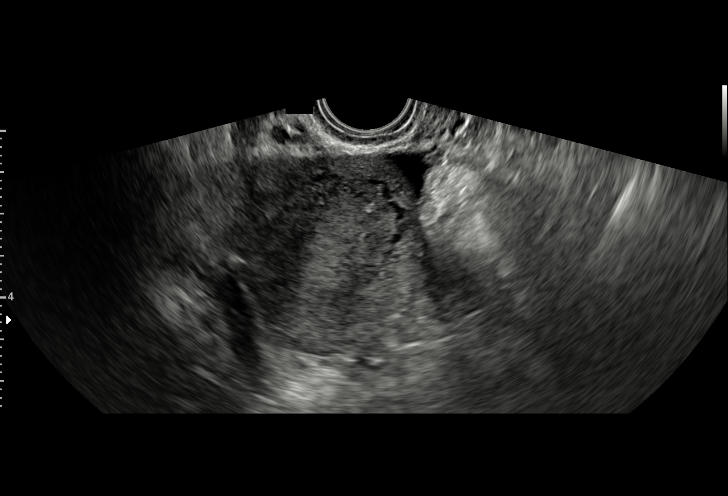
[im 37/53]
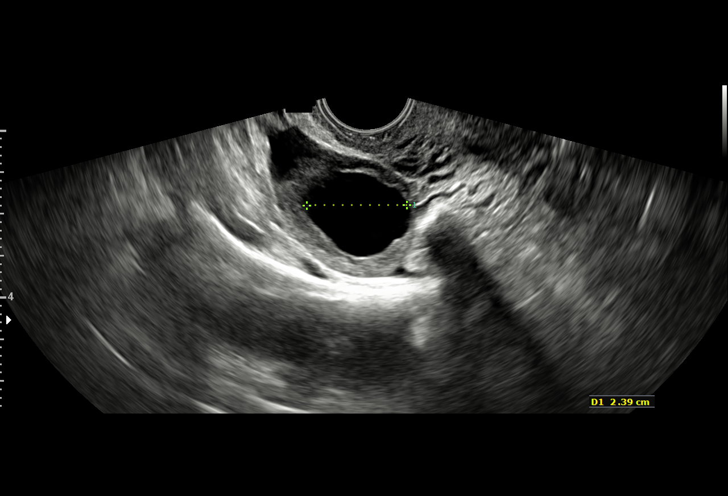
[im 41/53]
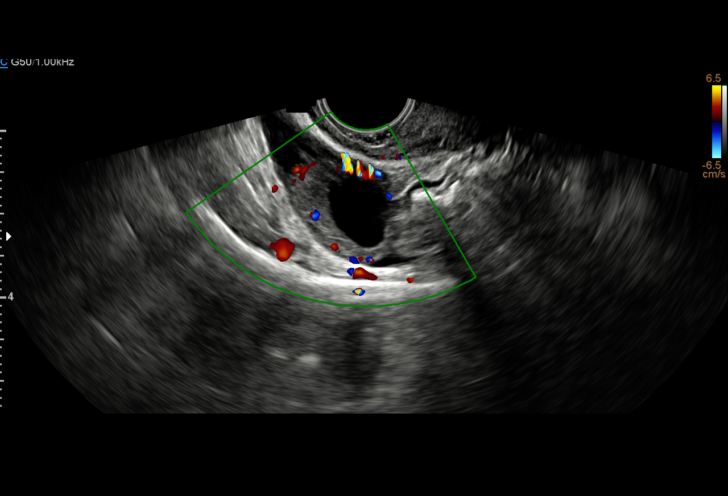
[im 45/53]
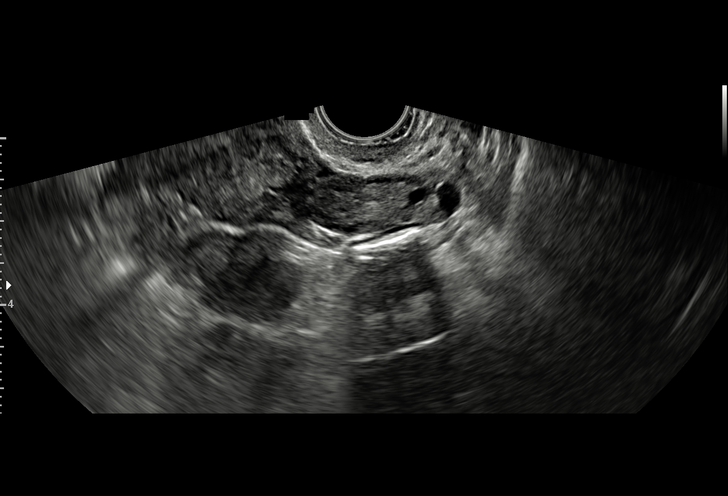
[im 49/53]
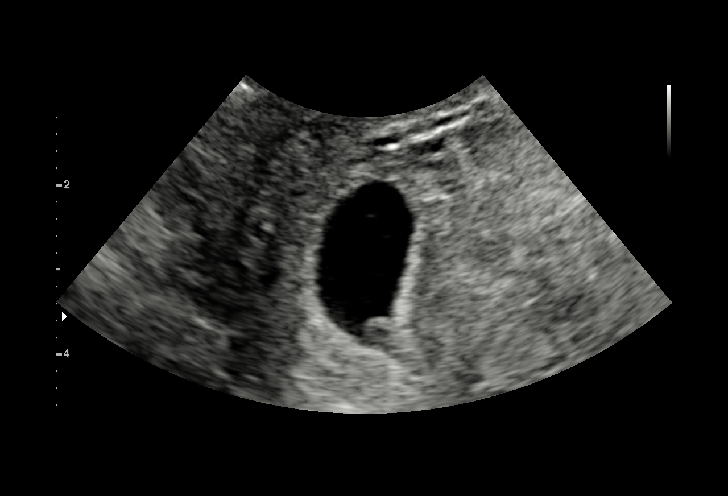
[im 53/53]
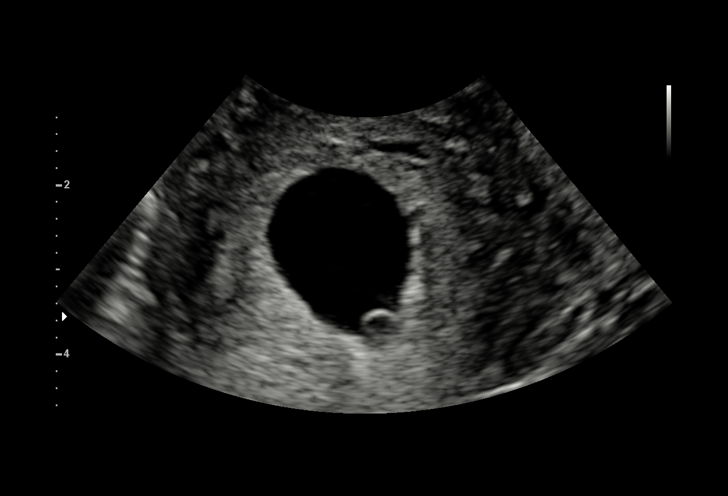

[15 of 28 positions shown; findings below may reference images not displayed]

FINDINGS: Intrauterine gestational sac: Single gestational sac identified.

Yolk sac:  Visualized

Embryo:  Not visualized

Cardiac Activity: Not visualized

MSD: 19.7  mm   6 w   6  d

Subchorionic hemorrhage:  None visualized.

Maternal uterus/adnexae: Uterus is grossly unremarkable. The ovaries
are within normal limits. The left ovary measures 2 x 3.4 x 1.4 cm.
The right ovary measures 3.9 x 3.1 by 3.2 cm. There is a 3 cm corpus
luteal cyst in the right ovary.
IMPRESSION: 1. A single intrauterine gestational sac with a small yolk sac is
visualized. No fetal pole identified. Recommend follow-up
quantitative B-HCG levels and follow-up US in 14 days to assess
viability.
2. 3 cm corpus luteal cyst in the right ovary.
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
## 2018-09-27 ENCOUNTER — Emergency Department (HOSPITAL_COMMUNITY): Payer: Self-pay

## 2018-09-27 ENCOUNTER — Encounter (HOSPITAL_COMMUNITY): Payer: Self-pay | Admitting: Emergency Medicine

## 2018-09-27 ENCOUNTER — Other Ambulatory Visit: Payer: Self-pay

## 2018-09-27 ENCOUNTER — Emergency Department (HOSPITAL_COMMUNITY)
Admission: EM | Admit: 2018-09-27 | Discharge: 2018-09-27 | Disposition: A | Discharge status: Home / Self Care | Payer: Self-pay | Attending: Emergency Medicine | Admitting: Emergency Medicine

## 2018-09-27 DIAGNOSIS — E039 Hypothyroidism, unspecified: Secondary | ICD-10-CM | POA: Insufficient documentation

## 2018-09-27 DIAGNOSIS — N3000 Acute cystitis without hematuria: Secondary | ICD-10-CM | POA: Insufficient documentation

## 2018-09-27 DIAGNOSIS — Z79899 Other long term (current) drug therapy: Secondary | ICD-10-CM | POA: Insufficient documentation

## 2018-09-27 LAB — CBC
HEMATOCRIT: 42.5 % (ref 36.0–46.0)
Hemoglobin: 13.7 g/dL (ref 12.0–15.0)
MCH: 29 pg (ref 26.0–34.0)
MCHC: 32.2 g/dL (ref 30.0–36.0)
MCV: 89.9 fL (ref 80.0–100.0)
PLATELETS: 176 10*3/uL (ref 150–400)
RBC: 4.73 MIL/uL (ref 3.87–5.11)
RDW: 11.9 % (ref 11.5–15.5)
WBC: 15.5 10*3/uL — ABNORMAL HIGH (ref 4.0–10.5)
nRBC: 0 % (ref 0.0–0.2)

## 2018-09-27 LAB — URINALYSIS, ROUTINE W REFLEX MICROSCOPIC
BILIRUBIN URINE: NEGATIVE
Glucose, UA: NEGATIVE mg/dL
KETONES UR: 80 mg/dL — AB
LEUKOCYTES UA: NEGATIVE
Nitrite: POSITIVE — AB
PH: 8 (ref 5.0–8.0)
Protein, ur: NEGATIVE mg/dL
SPECIFIC GRAVITY, URINE: 1.019 (ref 1.005–1.030)

## 2018-09-27 LAB — COMPREHENSIVE METABOLIC PANEL
ALBUMIN: 4.5 g/dL (ref 3.5–5.0)
ALK PHOS: 78 U/L (ref 38–126)
ALT: 18 U/L (ref 0–44)
AST: 32 U/L (ref 15–41)
Anion gap: 15 (ref 5–15)
BUN: 10 mg/dL (ref 6–20)
CALCIUM: 9.4 mg/dL (ref 8.9–10.3)
CHLORIDE: 99 mmol/L (ref 98–111)
CO2: 22 mmol/L (ref 22–32)
CREATININE: 0.7 mg/dL (ref 0.44–1.00)
GFR calc Af Amer: >60 mL/min (ref >60)
GFR calc non Af Amer: >60 mL/min (ref >60)
GLUCOSE: 119 mg/dL — AB (ref 70–99)
Potassium: 3.4 mmol/L — ABNORMAL LOW (ref 3.5–5.1)
SODIUM: 136 mmol/L (ref 135–145)
Total Bilirubin: 1 mg/dL (ref 0.3–1.2)
Total Protein: 7.9 g/dL (ref 6.5–8.1)

## 2018-09-27 LAB — POC URINE PREG, ED: PREG TEST UR: NEGATIVE

## 2018-09-27 MED ORDER — CEPHALEXIN 500 MG PO CAPS: 500.0000 mg | ORAL_CAPSULE | Freq: Four times a day (QID) | ORAL | 0 refills | Status: AC

## 2018-09-27 MED ORDER — KETOROLAC TROMETHAMINE 30 MG/ML IJ SOLN
30.0000 mg | Freq: Once | INTRAMUSCULAR | Status: AC
  Administered 2018-09-27: 30 mg via INTRAVENOUS
  Filled 2018-09-27: qty 1

## 2018-09-27 MED ORDER — LORAZEPAM 2 MG/ML IJ SOLN
1.0000 mg | Freq: Once | INTRAMUSCULAR | Status: AC
  Administered 2018-09-27: 1 mg via INTRAVENOUS
  Filled 2018-09-27: qty 1

## 2018-09-27 MED ORDER — NAPROXEN 500 MG PO TABS: 500.0000 mg | ORAL_TABLET | Freq: Two times a day (BID) | ORAL | 0 refills | Status: DC

## 2018-09-27 MED ORDER — SODIUM CHLORIDE 0.9 % IV SOLN
1.0000 g | Freq: Once | INTRAVENOUS | Status: AC
  Administered 2018-09-27: 1 g via INTRAVENOUS
  Filled 2018-09-27: qty 10

## 2018-09-27 NOTE — ED Provider Notes (Signed)
Care assumed from [facility], PA-C at shift change with Ultrasound pending.   In brief, this patient is a 37 y.o. F with evaluation of cramping abdominal pain that has been ongoing for last 3 days.  Additionally, patient reports she has been on her menstrual cycle dates that is been heavier than normal.  Patient last seen by OB/GYN about a year ago after she had a baby.  She reports history of tubal ligation but no records indicate.  Please see note from previous provider for full history/physical exam.  PLAN:  Patient with some left lower quadrant suprapubic tenderness on exam.  No right lower quadrant tenderness.  Pregnancy test is negative.  UA is nitrite positive.  Patient receiving IV antibiotics here in ED.  Plan to have her go home with Keflex for treatment of UTI.  MDM:  Present reviewed.  Normal appearance of uterus, ovaries.  No evidence of torsion.  Discussed results with patient.  She reports improvement in pain after analgesics.  Repeat abdominal exam is benign.  Vital signs are stable.  Patient stable for discharge at this time.  Instructed patient that she will go home with antibiotics for treatment of UTI.  Additionally, encouraged NSAIDs for treatment of her pain.  Instructed patient that she will need to follow-up with her OB/GYN regarding further evaluation of her symptoms.  At this time, patient exhibits no emergent life-threatening condition that require further evaluation in ED. Patient had ample opportunity for questions and discussion. All patient's questions were answered with full understanding. Strict return precautions discussed. Patient expresses understanding and agreement to plan.   1. Acute cystitis without hematuria        Volanda Napoleon, PA-C 09/27/18 2315    Little, Wenda Overland, MD 09/30/18 1816

## 2018-09-27 NOTE — ED Notes (Signed)
Patient verbalizes understanding of discharge instructions. Opportunity for questioning and answers were provided. Armband removed by staff, pt discharged from ED.  
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
## 2018-09-27 NOTE — ED Notes (Signed)
Patient transported to US 

## 2018-09-27 NOTE — ED Provider Notes (Signed)
[facility] EMERGENCY DEPARTMENT Provider Note   CSN: [id number] Arrival date & time: 09/27/18  1647     History   Chief Complaint Chief Complaint  Patient presents with  . Abdominal Pain    HPI Shelly Greene is a 37 y.o. female.  37 year old female with past medical history including brainstem lesion, gestational diabetes, hypothyroidism who presents with abdominal cramping.  Patient states that she had tubal ligation last year after her child was born.  Initially she was breast-feeding and was not menstruating but when she stopped, she started having periods that involved worse cramping than usual.  In the past her periods were very mild and she did not have pain but over the past several months she has had worsening pain with menstruation.  3 days ago she began having severe lower abdominal cramping that is intermittent, started menstruating 2 days ago.  Her periods usually last up to 10 days.  She reports associated nausea but no vomiting, diarrhea, or urinary symptoms.  No vaginal discharge.  She has tried ibuprofen with no relief.  She has not followed with an OB/GYN regarding the symptoms.  The history is provided by the patient.  Abdominal Pain      Past Medical History:  Diagnosis Date  . Brain stem lesion    Being followed [facility] Neurosurgical [facility]  . Gestational diabetes   . History of gestational diabetes 11/08/2016   Early a1c and CMP negative. Normal 3rd trimester 2 hr GTT.  Marland Kitchen Hypothyroidism   . PILAR CYST 02/27/2007   Qualifier: Diagnosis of  By: Walker Kehr MD, Patrick Jupiter    . 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 Vitamin D deficiency     Patient Active Problem List   Diagnosis Date Noted  . Obesity (BMI 30.0-34.9) 01/18/2017  . 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 CARPAL TUNNEL SYNDROME 01/18/2007    Past Surgical History:  Procedure Laterality Date  . CESAREAN SECTION N/A 06/01/2017   Procedure: CESAREAN SECTION  Patient has Brain Stem Lesion, will  require General anesthesia;  Surgeon: Lavonia Drafts, MD;  Location: [facility];  Service: Obstetrics;  Laterality: N/A;  . DILATION AND EVACUATION N/A 02/13/2015   Procedure: DILATATION AND EVACUATION;  Surgeon: Truett Mainland, DO;  Location: [facility];  Service: Gynecology;  Laterality: N/A;  . EYE SURGERY       OB History    Gravida  5   Para  3   Term  3   Preterm  0   AB  2   Living  3     SAB  2   TAB  0   Ectopic  0   Multiple  0   Live Births  3            Home Medications    Prior to Admission medications   Medication Sig Start Date End Date Taking? Authorizing Provider  cyclobenzaprine (FLEXERIL) 10 MG tablet Take 1 tablet (10 mg total) by mouth 2 (two) times daily as needed for muscle spasms. 09/22/17   McDonald, Mia A, PA-C  ibuprofen (ADVIL,MOTRIN) 600 MG tablet Take 1 tablet (600 mg total) by mouth every 6 (six) hours as needed. Patient not taking: Reported on 07/05/2017 06/03/17   Myrtis Ser, CNM  levothyroxine (SYNTHROID, LEVOTHROID) 50 MCG tablet Take 1 tablet (50 mcg total) by mouth daily before breakfast. 05/08/17   Aletha Halim, MD  oxyCODONE-acetaminophen (PERCOCET/ROXICET) 5-325 MG tablet Take 2 tablets by mouth every 4 (four) hours as  needed for moderate pain. Patient not taking: Reported on 07/05/2017 06/03/17   Myrtis Ser, CNM  Prenatal Vit-Fe Fumarate-FA (PRENATAL MULTIVITAMIN) TABS tablet Take 1 tablet by mouth daily at 12 noon.    [provider]    Family History Family History  Problem Relation Age of Onset  . Prostate cancer Father     Social History Social History   Tobacco Use  . Smoking status: Never Smoker  . Smokeless tobacco: Never Used  Substance Use Topics  . Alcohol use: No    Alcohol/week: 0.0 standard drinks  . Drug use: No     Allergies   Patient has no known allergies.   Review of Systems Review of Systems  Gastrointestinal: Positive for abdominal pain.   All other  systems reviewed and are negative except that which was mentioned in HPI   Physical Exam Updated Vital Signs BP 122/81   Pulse 62   Temp 97.8 F (36.6 C) (Oral)   Resp (!) 25   Wt 68 kg   LMP 09/22/2018   SpO2 100%   BMI 26.57 kg/m   Physical Exam  Constitutional: She is oriented to person, place, and time. She appears well-developed and well-nourished. No distress.  HENT:  Head: Normocephalic and atraumatic.  Moist mucous membranes  Eyes: Conjunctivae are normal.  Neck: Neck supple.  Cardiovascular: Normal rate, regular rhythm and normal heart sounds.  No murmur heard. Pulmonary/Chest: Breath sounds normal.  Initially hyperventilating, for me mildly tachypneic  Abdominal: Soft. Bowel sounds are normal. She exhibits no distension. There is tenderness in the suprapubic area and left lower quadrant. There is no rebound and no guarding.  Musculoskeletal: She exhibits no edema.  Neurological: She is alert and oriented to person, place, and time.  Fluent speech  Skin: Skin is warm and dry.  Psychiatric: Judgment normal.  anxious  Nursing note and vitals reviewed.    ED Treatments / Results  Labs (all labs ordered are listed, but only abnormal results are displayed) Labs Reviewed  COMPREHENSIVE METABOLIC PANEL - Abnormal; Notable for the following components:      Result Value   Potassium 3.4 (*)    Glucose, Bld 119 (*)    All other components within normal limits  CBC - Abnormal; Notable for the following components:   WBC 15.5 (*)    All other components within normal limits  URINALYSIS, ROUTINE W REFLEX MICROSCOPIC - Abnormal; Notable for the following components:   APPearance HAZY (*)    Hgb urine dipstick MODERATE (*)    Ketones, ur 80 (*)    Nitrite POSITIVE (*)    Bacteria, UA RARE (*)    All other components within normal limits  URINE CULTURE  POC URINE PREG, ED    EKG EKG Interpretation  Date/Time:  Thursday September 27 2018 16:53:53  EST Ventricular Rate:  73 PR Interval:    QRS Duration: 93 QT Interval:  420 QTC Calculation: 463 R Axis:   78 Text Interpretation:  Sinus rhythm No previous ECGs available Confirmed by Theotis Burrow [phone]) on 09/27/2018 6:08:16 PM   Radiology No results found.  Procedures Procedures (including critical care time)  Medications Ordered in ED Medications  cefTRIAXone (ROCEPHIN) 1 g in sodium chloride 0.9 % 100 mL IVPB (has no administration in time range)  ketorolac (TORADOL) 30 MG/ML injection 30 mg (30 mg Intravenous Given 09/27/18 1829)  LORazepam (ATIVAN) injection 1 mg (1 mg Intravenous Given 09/27/18 1835)     Initial Impression /  Assessment and Plan / ED Course  I have reviewed the triage vital signs and the nursing notes.  Pertinent labs & imaging results that were available during my care of the patient were reviewed by me and considered in my medical decision making (see chart for details).      Patient pulled up in the ambulance bay and was reportedly hyperventilating in her car.  She was brought back to the ED where her breathing slowed.  Vital signs unremarkable.  She was very anxious on my exam.  Tenderness in suprapubic abd and left lower quadrant.  Given the pattern of her symptoms, I suspect a gynecologic cause and I highly doubt appendicitis or bowel obstruction.  Lab work notable for UA that is nitrite positive, added urine culture and gave dose of ceftriaxone.  UPT negative, CMP unremarkable.  I am signing the patient out to evening provider, her ultrasound is pending. Final Clinical Impressions(s) / ED Diagnoses   Final diagnoses:  Acute cystitis without hematuria    ED Discharge Orders    None       , Wenda Overland, MD 09/27/18 1955

## 2018-09-27 NOTE — Discharge Instructions (Addendum)
You can take Tylenol or Ibuprofen as directed for pain. You can alternate Tylenol and Ibuprofen every 4 hours. If you take Tylenol at 1pm, then you can take Ibuprofen at 5pm. Then you can take Tylenol again at 9pm.   As we discussed you can also try to take the Naprosyn. Do not take Naprosyn and tylenol/ibuprofen at the same time.   Take antibiotics as directed. Please take all of your antibiotics until finished.  Follow up with your OB/GYN for further evaluation.   Return to the Emergency Department for any worsening abdominal pain, fevers, persistent vomiting, chest pain, difficulty breathing, or any other concerns.

## 2018-09-30 LAB — URINE CULTURE

## 2018-10-01 ENCOUNTER — Telehealth: Payer: Self-pay | Admitting: Emergency Medicine

## 2018-10-01 NOTE — Telephone Encounter (Signed)
Post ED Visit - Positive Culture Follow-up  Culture report reviewed by antimicrobial stewardship pharmacist:  []  Elenor Quinones, Pharm.D. []  Heide Guile, Pharm.D., BCPS AQ-ID []  Parks Neptune, Pharm.D., BCPS []  Alycia Rossetti, Pharm.D., BCPS []  Quinlan, [place].D., BCPS, AAHIVP []  Legrand Como, Pharm.D., BCPS, AAHIVP []  Salome Arnt, PharmD, BCPS []  Johnnette Gourd, PharmD, BCPS []  Hughes Better, PharmD, BCPS []  Leeroy Cha, PharmD [facility] PharmD  Positive urine culture Treated with cephalexin, organism sensitive to the same and no further patient follow-up is required at this time.  Hazle Nordmann 10/01/2018, 1:13 PM

## 2018-10-02 ENCOUNTER — Ambulatory Visit: Payer: Self-pay | Admitting: Women's Health

## 2018-10-02 DIAGNOSIS — Z0289 Encounter for other administrative examinations: Secondary | ICD-10-CM

## 2018-12-04 ENCOUNTER — Encounter: Payer: Self-pay | Admitting: Obstetrics & Gynecology

## 2018-12-04 ENCOUNTER — Ambulatory Visit (INDEPENDENT_AMBULATORY_CARE_PROVIDER_SITE_OTHER): Payer: Self-pay | Admitting: Obstetrics & Gynecology

## 2018-12-04 ENCOUNTER — Ambulatory Visit (INDEPENDENT_AMBULATORY_CARE_PROVIDER_SITE_OTHER): Payer: Self-pay | Admitting: Clinical

## 2018-12-04 VITALS — BP 114/64 | HR 70 | Ht 63.0 in | Wt 136.0 lb

## 2018-12-04 DIAGNOSIS — Z113 Encounter for screening for infections with a predominantly sexual mode of transmission: Secondary | ICD-10-CM

## 2018-12-04 DIAGNOSIS — F4323 Adjustment disorder with mixed anxiety and depressed mood: Secondary | ICD-10-CM

## 2018-12-04 DIAGNOSIS — N945 Secondary dysmenorrhea: Secondary | ICD-10-CM

## 2018-12-04 MED ORDER — NORETHIN ACE-ETH ESTRAD-FE 1-20 MG-MCG(24) PO TABS: 1.0000 | ORAL_TABLET | Freq: Every day | ORAL | 11 refills | Status: DC

## 2018-12-04 NOTE — Patient Instructions (Signed)
Dismenorrea Dysmenorrhea  La dismenorrea son los calambres causados por los espasmos musculares del tero (contracciones) durante el perodo menstrual. La dismenorrea puede ser leve o puede ser tan intensa que interfiere con las actividades cotidianas durante algunos das, todos los [place]. La dismenorrea primaria son los calambres menstruales que duran algunos das al inicio de los perodos menstruales o poco despus. En general se inician despus de que la adolescente comienza a tener sus perodos. A medida que la mujer [employer], o tiene un beb, los clicos, generalmente, disminuyen o desaparecen. La dismenorrea secundaria aparece con la edad y es causada por un trastorno en el sistema reproductivo. Dura ms y puede generar ms dolor que la dismenorrea primaria. El dolor puede comenzar antes del perodo y [place] despus. Cules son las causas? La causa de la dismenorrea, generalmente, es un problema preexistente, por ejemplo:  El tejido que recubre interiormente al tero (endometrio) crece fuera del tero en otras reas del cuerpo (endometriosis).  El tejido endometrial crece en las paredes musculares del tero (adenomiosis).  Los vasos sanguneos de la pelvis se llenan de sangre antes del perodo menstrual (sndrome congestivo plvico).  El crecimiento excesivo de las clulas (plipos) en el endometrio o en la parte inferior del tero (cuello uterino).  El tero desciende a la vagina (prolapso) debido a que los msculos estn estirados o dbiles.  Problemas en la vejiga, como infeccin o inflamacin.  Problemas intestinales, como un tumor o sndrome de colon irritable.  Cncer de los rganos genitales o la vejiga.  Un tero muy inclinado.  Un cuello uterino cerrado o con una apertura muy pequea.  Tumores no cancerosos (benignos) en el tero (fibromas).  Enfermedad plvica inflamatoria (EPI).  Cicatrices en la pelvis (adherencias) por cirugas  previas.  Un quiste ovrico.  Un dispositivo intrauterino (DIU). Qu incrementa el riesgo? Es ms probable que tenga esta afeccin si:  Tiene menos de 30 aos.  Inici la pubertad en una etapa temprana.  Tiene sangrado irregular o abundante.  No ha tenido hijos.  Tiene antecedentes familiares de dismenorrea.  Fuma. Cules son los signos o los sntomas? Los sntomas de esta afeccin incluyen los siguientes:  [place], dolor punzante o una sensacin de saciedad en el abdomen inferior.  Dolor en la parte inferior de la espalda.  Los perodos duran ms de 7 das.  Dolores de [place].  Meteorismo.  Fatiga.  Nuseas o vmitos.  Diarrea.  Sudoracin o [employer].  Heces blandas. Cmo se diagnostica? Esta afeccin se puede diagnosticar en funcin de lo siguiente:  Sus sntomas.  Sus antecedentes mdicos.  Un examen fsico.  Anlisis de sangre.  Prueba de Papanicolaou. Esta es una prueba en la cual las clulas del cuello uterino se examinan en busca de cncer o infeccin.  Prueba de embarazo.  Pruebas de diagnstico por imgenes, por ejemplo: ? Ecografa. ? Un procedimiento para retirar y examinar [place] del tejido endometrial (dilatacin y curetaje, D&C). ? Un procedimiento para examinar visualmente el interior de:  El tero (histeroscopia).  El abdomen o la pelvis (laparoscopia).  La vejiga (cistoscopia).  El intestino (colonoscopia).  El estmago (gastroscopia). ? Radiografas. ? Exploracin por tomografa computarizada (TC). ? Resonancia magntica (RM). Cmo se trata? El tratamiento depende de la causa de la dismenorrea. El tratamiento puede incluir lo siguiente:  Analgsicos indicados por su mdico.  Pldoras anticonceptivas que contienen la hormona progesterona.  Un DIU que contiene la hormona progesterona.  Medicamentos para Geneticist, molecular.  Cornelius Moras  de reemplazo hormonal.  Antiinflamatorios no esteroideos (AINE). Esto  puede ayudar a Ambulance person produccin de hormonas que provoca calambres.  Antidepresivos.  Ciruga para eliminar adherencias, endometriosis, quistes ovricos, fibromas o el tero entero (histerectoma).  Inyecciones de progesterona para detener el perodo menstrual.  Un procedimiento para destruir el endometrio (ablacin del endometrio).  Un procedimiento para cortar los nervios en la parte inferior de la espalda (sacro) que van hasta los rganos genitales (neurectoma presacra).  Un procedimiento para aplicar una corriente elctrica a los nervios en el sacro (estimulacin del nervio sacro).  Actividad fsica y fisioterapia.  Meditacin y yoga.  Acupuntura. Trabaje con su mdico para determinar qu tratamiento o combinacin de tratamientos es mejor para usted. Siga estas indicaciones en su casa: Best boy y los calambres  Coloque calor en la parte inferior de la espalda o el abdomen cuando sienta dolor o calambres. Use la fuente de calor que el mdico le recomiende, como una compresa de calor hmedo o una almohadilla trmica. ? Coloque una [employer] piel y la fuente de Freight forwarder. ? Aplique el calor durante 20 a 50minutos. ? Retire la fuente de calor si la piel se pone de color rojo brillante. Esto es muy importante si no puede Education officer, environmental, calor o fro. Puede correr un riesgo mayor de sufrir quemaduras. ? No duerma con una almohadilla trmica encendida.  Haga ejercicios aerbicos, como caminar, trotar, nadar o andar en bicicleta. Esto puede ayudar a [employer].  [place] la parte inferior de la espalda o el abdomen para Best boy. Instrucciones generales  [employer] de venta libre y los recetados solamente como se lo haya indicado el mdico.  No conduzca ni use maquinaria pesada mientras toma analgsicos recetados.  Evite la ingesta de alcohol y cafena inmediatamente antes y durante el perodo menstrual. Esto puede empeorar los  calambres.  No consuma ningn producto que contenga nicotina o tabaco, como cigarrillos y Psychologist, sport and exercise. Si necesita ayuda para dejar de fumar, consulte al mdico.  Concurra a todas las visitas de control como se lo haya indicado el mdico. Esto es importante. Comunquese con un mdico si:  Tiene un dolor que empeora o que no mejora con los medicamentos.  Siente dolor durante el sexo.  Tiene nuseas o vmitos durante el perodo menstrual que no puede controlar con medicamentos. Solicite ayuda de inmediato si:  Se desmaya. Resumen  La dismenorrea son los calambres causados por los espasmos musculares del tero (contracciones) durante el perodo menstrual.  La dismenorrea puede ser leve o puede ser tan intensa que interfiere con las actividades cotidianas durante algunos das, todos los [employer].  El tratamiento depende de la causa de la dismenorrea.  Trabaje con su mdico para determinar qu tratamiento o combinacin de tratamientos es mejor para usted. Esta informacin no tiene Marine scientist el consejo del mdico. Asegrese de hacerle al mdico cualquier pregunta que tenga. Document Released: 08/17/2005 Document Revised: 03/16/2017 Document Reviewed: 03/16/2017 Elsevier Interactive Patient Education  2019 [employer].

## 2018-12-04 NOTE — BH Specialist Note (Signed)
Integrated Behavioral Health Initial Visit  MRN: [id number] Name: Shelly Greene  Number of [place] Clinician visits:: 1/6 Session Start time: 12:19  Session End time: 12:40 Total time: 20 minutes  Type of Service: [place] Interpretor:Yes.   Interpretor Name and Language: Raquel, Spanish   Warm Hand Off Completed.       SUBJECTIVE: Shelly Greene is a 38 y.o. female accompanied by n/a Patient was referred by Lavonia Drafts, MD for symptoms of anxiety and depression related to pain. Patient reports the following symptoms/concerns: Pt states her primary concern today is increasing pelvic pain, that she attributes to clips used during tubal ligation. Pt prefers to not take medication, but agrees to take, as pain has been interfering with daily functioning.  Duration of problem: Increasing over one year; Severity of problem: moderate  OBJECTIVE: Mood: Depressed and Affect: Depressed Risk of harm to self or others: No plan to harm self or others  LIFE CONTEXT: Family and Social: Pt lives with her husband and children (14,9; 1) School/Work: - Self-Care: - Life Changes: Ongoing pelvic pain   GOALS ADDRESSED: Patient will: 1. Reduce symptoms of: anxiety, depression and stress 2. Increase knowledge and/or ability of: stress reduction  3. Demonstrate ability to: Increase motivation to adhere to plan of care  INTERVENTIONS: Interventions utilized: Motivational Interviewing and Psychoeducation and/or Health Education  Standardized Assessments completed: GAD-7 and PHQ 9  ASSESSMENT: Patient currently experiencing Adjustment disorder with mixed anxiety and depressed mood   Patient may benefit from psychoeducation and brief therapeutic interventions regarding coping with symptoms of anxiety and depression related to chronic pain .  PLAN: 1. Follow up with behavioral health clinician on : As  needed 2. Behavioral recommendations:  -Begin taking medication, as prescribed by medical provider -Read educational materials regarding coping with symptoms of chronic pain -Consider track of level of daily pain, and other relevant symptoms  3. Referral(s): [place] (In Clinic) 4. "From scale of 1-10, how likely are you to follow plan?": 10  Garlan Fair, LCSW  Depression screen [facility] 2/9 12/04/2018  Decreased Interest 1  Down, Depressed, Hopeless 1  PHQ - 2 Score 2  Altered sleeping 1  Tired, decreased energy 3  Change in appetite 2  Feeling bad or failure about yourself  0  Trouble concentrating 2  Moving slowly or fidgety/restless 1  Suicidal thoughts 0  PHQ-9 Score 11   GAD 7 : Generalized Anxiety Score 12/04/2018  Nervous, Anxious, on Edge 2  Control/stop worrying 2  Worry too much - different things 2  Trouble relaxing 3  Restless 2  Easily annoyed or irritable 1  Afraid - awful might happen 2  Total GAD 7 Score 14

## 2018-12-04 NOTE — Progress Notes (Signed)
History:  38 y.o. [id number] here today for pelvic pain that was noted since her last c-section in 05/2017. LMP 11/21/2017. Pt reports that her first cycle after 06/2018. Monthly cycles since that time. Pt reports monthly cycles. Now the periods are assoc with pain, N/V and bloating.  Pt does boot camp daily.   The following portions of the patient's history were reviewed and updated as appropriate: allergies, current medications, past family history, past medical history, past social history, past surgical history and problem list.  Review of Systems:  Pertinent items are noted in HPI.    Objective:  Physical Exam Height 5\' 3"  (1.6 m), weight 136 lb (61.7 kg), unknown if currently breastfeeding.  CONSTITUTIONAL: Well-developed, well-nourished female in no acute distress.  HENT:  Normocephalic, atraumatic EYES: Conjunctivae and EOM are normal. No scleral icterus.  NECK: Normal range of motion SKIN: Skin is warm and dry. No rash noted. Not diaphoretic.No pallor. [place]: Alert and oriented to person, place, and time. Normal coordination.  Abd: Soft, nontender and nondistended. No rebound or guardign Pelvic: Normal appearing external genitalia; normal appearing vaginal mucosa and cervix.  Normal discharge.  Small uterus, no other palpable masses, no uterine or adnexal tenderness  Labs and Imaging 09/27/2018 CLINICAL DATA:  Left lower quadrant and suprapubic pain. Cramping and bleeding.  EXAM: TRANSABDOMINAL AND TRANSVAGINAL ULTRASOUND OF PELVIS  DOPPLER ULTRASOUND OF OVARIES  TECHNIQUE: Both transabdominal and transvaginal ultrasound examinations of the pelvis were performed. Transabdominal technique was performed for global imaging of the pelvis including uterus, ovaries, adnexal regions, and pelvic cul-de-sac.  It was necessary to proceed with endovaginal exam following the transabdominal exam to visualize the endometrium and ovaries. Color and duplex Doppler ultrasound was  utilized to evaluate blood flow to the ovaries.  COMPARISON:  None.  FINDINGS: Uterus  Measurements: 7.2 x 3.6 x 4.7 cm. No fibroids or other mass visualized.  Endometrium  Thickness: 4.4 mm.  No focal abnormality visualized.  Right ovary  Measurements: 3.2 x 2.3 x 2.0 cm = volume: 7.7 mL. Normal appearance/no adnexal mass.m  Left ovary  Measurements: 2.9 x 1.9 x 1.7 cm = volume: 4.9 mL. Normal appearance/no adnexal mass.  Pulsed Doppler evaluation of both ovaries demonstrates normal low-resistance arterial and venous waveforms.  Other findings  There is moderate free fluid in the pelvis.  IMPRESSION: 1. The uterus, endometrium, and ovaries are normal in appearance. 2. Moderate simple fluid in the pelvis is nonspecific but may be physiologic.   Assessment & Plan:  Secondary dysmenorrhea- pt is convinced that all of her sx are related to the BTL. She is concerned about the use of clips on the fallopian tubes. Sx could also be related to adhesions.    Loestrin 1/20mg  1 po daily as a trial to see if this helps with resolution F/u in 3 months or sooner prn    Total face-to-face time with patient was 30 min.  Greater than 50% was spent in counseling and coordination of care with the patient.      Live and video spanish interpreter used.   Doreena Maulden L. Harraway-Smith, M.D., Cherlynn June

## 2018-12-05 LAB — CERVICOVAGINAL ANCILLARY ONLY
CHLAMYDIA, DNA PROBE: NEGATIVE
NEISSERIA GONORRHEA: NEGATIVE
Trichomonas: NEGATIVE

## 2018-12-20 ENCOUNTER — Telehealth: Payer: Self-pay | Admitting: Obstetrics & Gynecology

## 2018-12-20 NOTE — Telephone Encounter (Signed)
Interpreter used to call patient, and VM was left about her appointment.

## 2018-12-25 ENCOUNTER — Telehealth: Payer: Self-pay | Admitting: General Practice

## 2018-12-25 NOTE — Telephone Encounter (Signed)
Called patient with Shelly Greene for [employer]. Told patient I am calling her to discuss her desire for a second opinion. Patient verbalized understanding & states yes she wants to see someone else. Explained to patient we normally refer patients outside of our office for that and offered appt with [place] in [facility]. Told patient she would have to bring money to that appt. Patient verbalized understanding & states she will think about that but for now she just wants to come here and see someone else. Patient states she is taking the medication as prescribed and is still having pain. Patient states she wants a CT scan or an X-Ray just to make sure everything is fine. Discussed with patient that wasn't the doctor's recommendation when she was here. Discussed I am happy to have her scheduled with another provider in our office but she should understand that all of our doctors work together and often practice the same and follow the same procedures/protocols. Told patient that a different provider at our office may still not feel she needs additional testing. Discussed I am happy to refer her outside of our office or with a different provider in our office but she should understand that a different provider in our office may agree with the same plan as her doctor at the 1/14 appt. Patient verbalized understanding to all & states she wants to see a different doctor at our office. Patient is aware of appt on 2/20 with Dr Roselie Awkward and was satisfied with appt. Patient had no questions.

## 2019-01-10 ENCOUNTER — Ambulatory Visit: Payer: Self-pay | Admitting: Obstetrics & Gynecology

## 2019-01-24 ENCOUNTER — Ambulatory Visit: Payer: Self-pay | Admitting: Obstetrics and Gynecology

## 2019-02-04 ENCOUNTER — Ambulatory Visit: Payer: Self-pay | Admitting: Family Medicine

## 2019-02-08 ENCOUNTER — Ambulatory Visit (INDEPENDENT_AMBULATORY_CARE_PROVIDER_SITE_OTHER): Payer: Self-pay | Admitting: Family Medicine

## 2019-02-08 ENCOUNTER — Encounter: Payer: Self-pay | Admitting: Family Medicine

## 2019-02-08 ENCOUNTER — Other Ambulatory Visit: Payer: Self-pay

## 2019-02-08 VITALS — BP 121/78 | HR 69 | Wt 137.0 lb

## 2019-02-08 DIAGNOSIS — R102 Pelvic and perineal pain: Secondary | ICD-10-CM

## 2019-02-08 DIAGNOSIS — T732XXA Exhaustion due to exposure, initial encounter: Secondary | ICD-10-CM

## 2019-02-08 NOTE — Patient Instructions (Signed)
Pelvic Pain, Female  Pelvic pain is pain in your lower belly (abdomen), below your belly button and between your hips. The pain may start suddenly (be acute), keep coming back (be recurring), or last a long time (become chronic). Pelvic pain that lasts longer than 6 months is called chronic pelvic pain. There are many causes of pelvic pain. Sometimes the cause of pelvic pain is not known.  Follow these instructions at home:    · Take over-the-counter and prescription medicines only as told by your doctor.  · Rest as told by your doctor.  · Do not have sex if it hurts.  · Keep a journal of your pelvic pain. Write down:  ? When the pain started.  ? Where the pain is located.  ? What seems to make the pain better or worse, such as food or your period (menstrual cycle).  ? Any symptoms you have along with the pain.  · Keep all follow-up visits as told by your doctor. This is important.  Contact a doctor if:  · Medicine does not help your pain.  · Your pain comes back.  · You have new symptoms.  · You have unusual discharge or bleeding from your vagina.  · You have a fever or chills.  · You are having trouble pooping (constipation).  · You have blood in your pee (urine) or poop (stool).  · Your pee smells bad.  · You feel weak or light-headed.  Get help right away if:  · You have sudden pain that is very bad.  · Your pain keeps getting worse.  · You have very bad pain and also have any of these symptoms:  ? A fever.  ? Feeling sick to your stomach (nausea).  ? Throwing up (vomiting).  ? Being very sweaty.  · You pass out (lose consciousness).  Summary  · Pelvic pain is pain in your lower belly (abdomen), below your belly button and between your hips.  · There are many possible causes of pelvic pain.  · Keep a journal of your pelvic pain.  This information is not intended to replace advice given to you by your health care provider. Make sure you discuss any questions you have with your health care provider.  Document  Released: 04/25/2008 Document Revised: 04/25/2018 Document Reviewed: 04/25/2018  Elsevier Interactive Patient Education © 2019 Elsevier Inc.

## 2019-02-08 NOTE — Assessment & Plan Note (Signed)
Unclear etiology, has normal u/s. Responded partially to OC's. Believes it is a clip issue. She is willing to try to continue the OC's since it is partially working.

## 2019-02-08 NOTE — Progress Notes (Signed)
   Subjective:    Patient ID: Shelly Greene is a 38 y.o. female presenting with Follow-up  on 02/08/2019  HPI: Last time she was here given OCPs and has been taking these. Reports pain since having her tubes tied. Still having pain on left side. Feels like someone is twisting her side on that side. Has hot flashes and chills with the pain. The bleeding is ok. Pain continues only with her cycle and for a few days before her cycle. Taking Ibuprofen for pain. Having some spotting. Has normal u/s. Pain is better now.  Review of Systems  Constitutional: Negative for chills and fever.  Respiratory: Negative for shortness of breath.   Cardiovascular: Negative for chest pain.  Gastrointestinal: Negative for abdominal pain, nausea and vomiting.  Genitourinary: Negative for dysuria.  Skin: Negative for rash.      Objective:    BP 121/78   Pulse 69   Wt 137 lb (62.1 kg)   LMP 02/05/2019   BMI 24.27 kg/m  Physical Exam Constitutional:      General: She is not in acute distress.    Appearance: She is well-developed.  HENT:     Head: Normocephalic and atraumatic.  Eyes:     General: No scleral icterus. Neck:     Musculoskeletal: Neck supple.  Cardiovascular:     Rate and Rhythm: Normal rate.  Pulmonary:     Effort: Pulmonary effort is normal.  Abdominal:     Palpations: Abdomen is soft.  Skin:    General: Skin is warm and dry.  Neurological:     Mental Status: She is alert and oriented to person, place, and time.         Assessment & Plan:   Problem List Items Addressed This Visit      Unprioritized   Pelvic pain    Unclear etiology, has normal u/s. Responded partially to OC's. Believes it is a clip issue. She is willing to try to continue the OC's since it is partially working.       Other Visit Diagnoses    Fatigue due to exposure, initial encounter    -  Primary   Relevant Orders   TSH   VITAMIN D 25 Hydroxy (Vit-D Deficiency, Fractures)      Total  face-to-face time with patient: 10 minutes. Over 50% of encounter was spent on counseling and coordination of care. Return in about 3 months (around 05/11/2019).  Donnamae Jude 02/08/2019 10:05 AM

## 2019-02-09 LAB — VITAMIN D 25 HYDROXY (VIT D DEFICIENCY, FRACTURES): Vit D, 25-Hydroxy: 51.8 ng/mL (ref 30.0–100.0)

## 2019-02-09 LAB — TSH: TSH: 2.33 u[IU]/mL (ref 0.450–4.500)

## 2019-05-18 ENCOUNTER — Emergency Department (HOSPITAL_COMMUNITY): Payer: HRSA Program

## 2019-05-18 ENCOUNTER — Encounter (HOSPITAL_COMMUNITY): Payer: Self-pay | Admitting: Emergency Medicine

## 2019-05-18 ENCOUNTER — Other Ambulatory Visit: Payer: Self-pay

## 2019-05-18 ENCOUNTER — Emergency Department (HOSPITAL_COMMUNITY)
Admission: EM | Admit: 2019-05-18 | Discharge: 2019-05-18 | Disposition: A | Discharge status: Home / Self Care | Payer: HRSA Program | Attending: Emergency Medicine | Admitting: Emergency Medicine

## 2019-05-18 DIAGNOSIS — J1289 Other viral pneumonia: Secondary | ICD-10-CM | POA: Diagnosis not present

## 2019-05-18 DIAGNOSIS — J189 Pneumonia, unspecified organism: Secondary | ICD-10-CM

## 2019-05-18 DIAGNOSIS — R0602 Shortness of breath: Secondary | ICD-10-CM | POA: Diagnosis present

## 2019-05-18 DIAGNOSIS — Z20822 Contact with and (suspected) exposure to covid-19: Secondary | ICD-10-CM

## 2019-05-18 DIAGNOSIS — E039 Hypothyroidism, unspecified: Secondary | ICD-10-CM | POA: Diagnosis not present

## 2019-05-18 DIAGNOSIS — U071 COVID-19: Secondary | ICD-10-CM | POA: Insufficient documentation

## 2019-05-18 DIAGNOSIS — Z79899 Other long term (current) drug therapy: Secondary | ICD-10-CM | POA: Diagnosis not present

## 2019-05-18 LAB — CBC WITH DIFFERENTIAL/PLATELET
Abs Immature Granulocytes: 0.01 10*3/uL (ref 0.00–0.07)
Basophils Absolute: 0 10*3/uL (ref 0.0–0.1)
Basophils Relative: 0 %
Eosinophils Absolute: 0 10*3/uL (ref 0.0–0.5)
Eosinophils Relative: 0 %
HCT: 36.7 % (ref 36.0–46.0)
Hemoglobin: 12.2 g/dL (ref 12.0–15.0)
Immature Granulocytes: 0 %
Lymphocytes Relative: 25 %
Lymphs Abs: 0.9 10*3/uL (ref 0.7–4.0)
MCH: 29.3 pg (ref 26.0–34.0)
MCHC: 33.2 g/dL (ref 30.0–36.0)
MCV: 88 fL (ref 80.0–100.0)
Monocytes Absolute: 0.3 10*3/uL (ref 0.1–1.0)
Monocytes Relative: 8 %
Neutro Abs: 2.3 10*3/uL (ref 1.7–7.7)
Neutrophils Relative %: 67 %
Platelets: 130 10*3/uL — ABNORMAL LOW (ref 150–400)
RBC: 4.17 MIL/uL (ref 3.87–5.11)
RDW: 12.6 % (ref 11.5–15.5)
WBC: 3.5 10*3/uL — ABNORMAL LOW (ref 4.0–10.5)
nRBC: 0 % (ref 0.0–0.2)

## 2019-05-18 LAB — COMPREHENSIVE METABOLIC PANEL
ALT: 20 U/L (ref 0–44)
AST: 31 U/L (ref 15–41)
Albumin: 3.3 g/dL — ABNORMAL LOW (ref 3.5–5.0)
Alkaline Phosphatase: 51 U/L (ref 38–126)
Anion gap: 9 (ref 5–15)
BUN: 6 mg/dL (ref 6–20)
CO2: 26 mmol/L (ref 22–32)
Calcium: 8.7 mg/dL — ABNORMAL LOW (ref 8.9–10.3)
Chloride: 104 mmol/L (ref 98–111)
Creatinine, Ser: 0.6 mg/dL (ref 0.44–1.00)
GFR calc Af Amer: >60 mL/min (ref >60)
GFR calc non Af Amer: >60 mL/min (ref >60)
Glucose, Bld: 101 mg/dL — ABNORMAL HIGH (ref 70–99)
Potassium: 4.2 mmol/L (ref 3.5–5.1)
Sodium: 139 mmol/L (ref 135–145)
Total Bilirubin: 0.4 mg/dL (ref 0.3–1.2)
Total Protein: 6.6 g/dL (ref 6.5–8.1)

## 2019-05-18 LAB — URINALYSIS, ROUTINE W REFLEX MICROSCOPIC
Bacteria, UA: NONE SEEN
Bilirubin Urine: NEGATIVE
Glucose, UA: NEGATIVE mg/dL
Hgb urine dipstick: NEGATIVE
Ketones, ur: 5 mg/dL — AB
Nitrite: NEGATIVE
Protein, ur: NEGATIVE mg/dL
Specific Gravity, Urine: 1.014 (ref 1.005–1.030)
pH: 6 (ref 5.0–8.0)

## 2019-05-18 LAB — I-STAT BETA HCG BLOOD, ED (MC, WL, AP ONLY): I-stat hCG, quantitative: <5 m[IU]/mL (ref <5)

## 2019-05-18 MED ORDER — BENZONATATE 100 MG PO CAPS: 100.0000 mg | ORAL_CAPSULE | Freq: Three times a day (TID) | ORAL | 0 refills | Status: DC

## 2019-05-18 MED ORDER — AZITHROMYCIN 250 MG PO TABS: 250.0000 mg | ORAL_TABLET | Freq: Every day | ORAL | 0 refills | Status: DC

## 2019-05-18 NOTE — Discharge Instructions (Addendum)
Ha recibido el exam de coronavirus. Si es positivo, va a recibir Product manager. Si es negativo, no recibe Product manager. Puede ver los resultos en MyChart (instrucciones en las papeles).  Tiene una pneumonia. Por eso, toma el antibiotico (azithromycin).  Toma tessalon o otras pastillas por tos.  Toma tylenol o ibuprofen si tiene calentura o dolor.  Regresa a la sala de emegencia si tiene dificultad de respiracion o si esta respirando rapidamente. Regresa si los sintomas empeoran.

## 2019-05-18 NOTE — ED Notes (Signed)
Ambulated pt in room, pt's SpO2 stayed at 98%.

## 2019-05-18 NOTE — ED Provider Notes (Signed)
[facility] DEPARTMENT Provider Note   CSN: [id number] Arrival date & time: 05/18/19  1425    History   Chief Complaint Chief Complaint  Patient presents with  . Shortness of Breath  . Chest Pain    HPI Shelly Greene is a 38 y.o. female presenting for evaluation of sob, cp and fever.  Pt states he sxs began 6 days ago. sxs began as a headache. She then developed a fever which was present every day until it resolved yesterday. Last dose of medication/antipyretics was yesterday.  States over the past 4 hours, she has been having worsening shortness of breath and chest pain, and that she has chest burning when she takes deep breaths pain, breathes rapidly, or coughs.  No pain at rest.  Pt states her whole family is now sick, she was the first one to get sick. No one has been tested. She states she has been feeling very weak. She has a poor appetite the past few days. She denieis st, loss of taste/smell, n/v, abd pain, or abnormal BMs. Pt states she has had some urinary frequency and dysuria, but denies hematuria. She has no medical problems. She takes vitamins and OCPs daily. She denies tobacco, alcohol, or drug use. She denies contact with known covid-19 + persons. 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      HPI  Past Medical History:  Diagnosis Date  . Brain stem lesion    Being followed [facility] Neurosurgical Dept  . Gestational diabetes   . History of gestational diabetes 11/08/2016   Early a1c and CMP negative. Normal 3rd trimester 2 hr GTT.  Marland Kitchen Hypothyroidism   . PILAR CYST 02/27/2007   Qualifier: Diagnosis of  By: Walker Kehr MD, Patrick Jupiter    . 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 Vitamin D deficiency     Patient Active Problem List   Diagnosis Date Noted  . Pelvic pain 02/08/2019  . Obesity (BMI 30.0-34.9) 01/18/2017  . 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 CARPAL TUNNEL SYNDROME 01/18/2007    Past Surgical  History:  Procedure Laterality Date  . CESAREAN SECTION N/A 06/01/2017   Procedure: CESAREAN SECTION  Patient has Brain Stem Lesion, will require General anesthesia;  Surgeon: Lavonia Drafts, MD;  Location: [facility];  Service: Obstetrics;  Laterality: N/A;  . DILATION AND EVACUATION N/A 02/13/2015   Procedure: DILATATION AND EVACUATION;  Surgeon: Truett Mainland, DO;  Location: [facility];  Service: Gynecology;  Laterality: N/A;  . EYE SURGERY       OB History    Gravida  5   Para  3   Term  3   Preterm  0   AB  2   Living  3     SAB  2   TAB  0   Ectopic  0   Multiple  0   Live Births  3            Home Medications    Prior to Admission medications   Medication Sig Start Date End Date Taking? Authorizing Provider  azithromycin (ZITHROMAX) 250 MG tablet Take 1 tablet (250 mg total) by mouth daily. Take first 2 tablets together, then 1 every day until finished. 05/18/19   Lamaria Hildebrandt, PA-C  benzonatate (TESSALON) 100 MG capsule Take 1 capsule (100 mg total) by mouth every 8 (eight) hours. 05/18/19   Obie Silos, PA-C  diclofenac (VOLTAREN) 75 MG EC tablet Take  75 mg by mouth 2 (two) times daily.    [provider]  levothyroxine (SYNTHROID, LEVOTHROID) 50 MCG tablet Take 1 tablet (50 mcg total) by mouth daily before breakfast. 05/08/17   Aletha Halim, MD  Norethindrone Acetate-Ethinyl Estrad-FE (LOESTRIN 24 FE) 1-20 MG-MCG(24) tablet Take 1 tablet by mouth daily. 12/04/18   Lavonia Drafts, MD    Family History Family History  Problem Relation Age of Onset  . Prostate cancer Father     Social History Social History   Tobacco Use  . Smoking status: Never Smoker  . Smokeless tobacco: Never Used  Substance Use Topics  . Alcohol use: No    Alcohol/week: 0.0 standard drinks  . Drug use: No     Allergies   Patient has no known allergies.   Review of Systems Review of Systems  Constitutional: Positive for  fever (resolved).  Respiratory: Positive for cough and shortness of breath.   Cardiovascular: Positive for chest pain.  Genitourinary: Positive for dysuria and frequency.  Neurological: Positive for weakness.  All other systems reviewed and are negative.    Physical Exam Updated Vital Signs BP 97/63   Pulse 70   Temp 98.2 F (36.8 C) (Oral)   Resp 20   Ht 5\' 3"  (1.6 m)   Wt 65.8 kg   LMP 03/11/2019 (Approximate) Comment: tubal ablation  SpO2 97%   BMI 25.69 kg/m   Physical Exam Vitals signs and nursing note reviewed.  Constitutional:      General: She is not in acute distress.    Appearance: She is well-developed.     Comments: Appears nontoxic  HENT:     Head: Normocephalic and atraumatic.  Eyes:     Extraocular Movements: Extraocular movements intact.     Conjunctiva/sclera: Conjunctivae normal.     Pupils: Pupils are equal, round, and reactive to light.  Neck:     Musculoskeletal: Normal range of motion and neck supple.  Cardiovascular:     Rate and Rhythm: Normal rate and regular rhythm.     Pulses: Normal pulses.  Pulmonary:     Effort: Pulmonary effort is normal. No respiratory distress.     Breath sounds: Normal breath sounds. No wheezing.     Comments: Speaking in full sentences. Clear lung sounds in all fields. No tachypnea on my exam.  Abdominal:     General: There is no distension.     Palpations: Abdomen is soft. There is no mass.     Tenderness: There is no abdominal tenderness. There is no guarding or rebound.  Musculoskeletal: Normal range of motion.     Right lower leg: No edema.     Left lower leg: No edema.     Comments: No leg pain or swelling  Skin:    General: Skin is warm and dry.     Capillary Refill: Capillary refill takes less than 2 seconds.  Neurological:     Mental Status: She is alert and oriented to person, place, and time.      ED Treatments / Results  Labs (all labs ordered are listed, but only abnormal results are  displayed) Labs Reviewed  CBC WITH DIFFERENTIAL/PLATELET - Abnormal; Notable for the following components:      Result Value   WBC 3.5 (*)    Platelets 130 (*)    All other components within normal limits  COMPREHENSIVE METABOLIC PANEL - Abnormal; Notable for the following components:   Glucose, Bld 101 (*)    Calcium 8.7 (*)  Albumin 3.3 (*)    All other components within normal limits  URINALYSIS, ROUTINE W REFLEX MICROSCOPIC - Abnormal; Notable for the following components:   Ketones, ur 5 (*)    Leukocytes,Ua TRACE (*)    All other components within normal limits  NOVEL CORONAVIRUS, NAA (HOSPITAL ORDER, SEND-OUT TO REF LAB)  I-STAT BETA HCG BLOOD, ED ([facility], [facility], [facility] ONLY)    EKG None  Radiology Dg Chest Portable 1 View  Result Date: 05/18/2019 CLINICAL DATA:  Cough and fever for 4 days. EXAM: PORTABLE CHEST 1 VIEW COMPARISON:  None. FINDINGS: The heart size and mediastinal contours are within normal limits. Mild asymmetric opacity is seen in right lung base, suspicious for pneumonia. Left lung is clear. No evidence of pleural effusion. IMPRESSION: Mild asymmetric opacity in right lung base, suspicious for pneumonia. Electronically Signed   By: Marlaine Hind M.D.   On: 05/18/2019 16:38    Procedures Procedures (including critical care time)  Medications Ordered in ED Medications - No data to display   Initial Impression / Assessment and Plan / ED Course  I have reviewed the triage vital signs and the nursing notes.  Pertinent labs & imaging results that were available during my care of the patient were reviewed by me and considered in my medical decision making (see chart for details).        Pt presenting for evaluation of fever, weakness, sob, and chest burning. physical exam shows pt who appears nontoxic. No respiratory distress. As pt's entire family ahs similar sxs, likely virla, consider covid-19. Low suspicion for acs or PE at this time. Will order basic labs, cxr,  and covid test. Pt ambulated with reassuring sats, as such will likely be safe for d/c if remaining w/u is negative.   Labs overall reassuring, no leukocytosis.  In fact, white count slightly low at 3.5, consistent with a viral illness.  Hemoglobin, kidney function, electrolytes stable.  EKG without STEMI.  Chest x-ray viewed interpreted by me, shows a lobe, which per radiologist is consistent with possible pneumonia.  As patient's fever has resolved, likely a viral pneumonia such as it could be pneumonia.  However, as patient is having worsening chest discomfort difficulty breathing, will treat for possible bacterial pneumonia as I do not have the covid test at this time.  Urine negative for infection.  Discussed findings and plan with patient.  Discussed continued symptomatic treatment and pending results for coronavirus.  Encouraged close monitoring and return if symptoms worsen.  At this time, patient appears safe for discharge.  Patient states she understands and agrees plan.  Shelly Greene was evaluated in Emergency Department on 05/18/2019 for the symptoms described in the history of present illness. She was evaluated in the context of the global COVID-19 pandemic, which necessitated consideration that the patient might be at risk for infection with the SARS-CoV-2 virus that causes COVID-19. Institutional protocols and algorithms that pertain to the evaluation of patients at risk for COVID-19 are in a state of rapid change based on information released by regulatory bodies including the CDC and federal and state organizations. These policies and algorithms were followed during the patient's care in the ED.  Final Clinical Impressions(s) / ED Diagnoses   Final diagnoses:  Suspected Covid-19 Virus Infection  Community acquired pneumonia, unspecified laterality    ED Discharge Orders         Ordered    azithromycin (ZITHROMAX) 250 MG tablet  Daily     05/18/19 1823  benzonatate (TESSALON)  100 MG capsule  Every 8 hours     05/18/19 1823           Franchot Heidelberg, PA-C 05/18/19 1832    Gareth Morgan, MD 05/20/19 [phone]

## 2019-05-18 NOTE — ED Triage Notes (Signed)
Pt c/o chest pain x 10 days along with shortness of breath, dry cough, and reports fever of 103.  Pt is afebrile at this time.  NAD at this time

## 2019-05-18 NOTE — ED Notes (Signed)
Patient verbalizes understanding of discharge instructions. Opportunity for questioning and answers were provided. Armband removed by staff, pt discharged from ED.  

## 2019-05-19 LAB — NOVEL CORONAVIRUS, NAA (HOSP ORDER, SEND-OUT TO REF LAB; TAT 18-24 HRS): SARS-CoV-2, NAA: DETECTED — AB

## 2019-05-21 ENCOUNTER — Encounter

## 2019-11-19 ENCOUNTER — Telehealth (INDEPENDENT_AMBULATORY_CARE_PROVIDER_SITE_OTHER): Payer: Self-pay

## 2019-11-19 DIAGNOSIS — N945 Secondary dysmenorrhea: Secondary | ICD-10-CM

## 2019-11-19 MED ORDER — NORETHIN ACE-ETH ESTRAD-FE 1-20 MG-MCG(24) PO TABS: 1.0000 | ORAL_TABLET | Freq: Every day | ORAL | 2 refills | Status: DC

## 2019-11-19 NOTE — Telephone Encounter (Signed)
Pt left VM on nurse line requesting birth control refill. Called pt. Per chart review Loestrin 24 Fe prescribed by [facility] on 12/04/18. 3 month supply reordered per protocol and pt notified she will need to schedule an appt for further birth control management. Pt verbalizes understanding.

## 2020-01-15 ENCOUNTER — Telehealth: Payer: Self-pay | Admitting: *Deleted

## 2020-01-15 DIAGNOSIS — N945 Secondary dysmenorrhea: Secondary | ICD-10-CM

## 2020-01-15 DIAGNOSIS — E039 Hypothyroidism, unspecified: Secondary | ICD-10-CM

## 2020-01-15 NOTE — Telephone Encounter (Signed)
Shelma called and left a message that was difficult to understand due to hard to hear. It sounded like she was asking about a refill.  Latrell Reitan,RN

## 2020-01-16 MED ORDER — NORETHIN ACE-ETH ESTRAD-FE 1-20 MG-MCG(24) PO TABS: 1.0000 | ORAL_TABLET | Freq: Every day | ORAL | 12 refills | Status: DC

## 2020-01-16 NOTE — Telephone Encounter (Addendum)
Called pt w/interpreter Eda Royal. Pt states she is taking OCP's as directed and she is not having any more pain. She is requesting refill of OCP's and wants to know if she needs follow up appt in office. Pt notes that it is a financial hardship for her to have office appts if not absolutely needed.   1110 Consult w/Dr. Ilda Basset. He reviewed pt's chart and made the following recommendations: Refill of OCP's approved for 1 year. Pt needs follow up office appt in 1 year to review plan of care. She does not require appt @ this time.   1330 Called pt w/Eda Royal and discussed recommendations per Dr. Ilda Basset. Pt stated she is only taking the white pills from the pack because the darker ones make her feel bad. I advised that we do not want her to go a full year without having a period. She needs to take the darker pills with every 3rd pack so that she will get a period several times yearly. She can take ibuprofen or tylenol if needed for cramps during her period. Pt then stated that she was not notified of her thyroid test results last year and wants to know if she should be taking her thyroid medication. She has not taken the medication since before March 2020 because she had no refills and also because she didn't get a call about the results. She does not have a PCP. I advised to pt that we need to check her thyroid level before recommendation can be made regarding the medication. She agreed to lab appt on 3/3 @ 1000 and voiced understanding of all information and instructions given. She agrees to call the office in 1 year to schedule follow up Gyn appt.

## 2020-01-22 ENCOUNTER — Other Ambulatory Visit: Payer: Self-pay

## 2020-01-22 DIAGNOSIS — E039 Hypothyroidism, unspecified: Secondary | ICD-10-CM

## 2020-01-23 LAB — TSH: TSH: 2.23 u[IU]/mL (ref 0.450–4.500)

## 2020-01-27 ENCOUNTER — Telehealth: Payer: Self-pay | Admitting: *Deleted

## 2020-01-27 DIAGNOSIS — E039 Hypothyroidism, unspecified: Secondary | ICD-10-CM

## 2020-01-27 NOTE — Telephone Encounter (Addendum)
-----   Message from Aletha Halim, MD sent at 01/27/2020  8:53 AM EST ----- Can you let her know that her TSH is normal? Thanks  3/8  1118  Called pt w/interpreter Laural Golden. She was advised of normal TSH. Pt stated that she has not been taking her medication regularly for a very long time. She last took it 2-3 weeks ago. (this is different than what pt told me on 01/16/20)  She wants to know if she still needs to take the medication and if so, she needs a new Rx as she does not have any of the medication. I advised that I will check with Dr. Ilda Basset and let her know. Pt voiced understanding. MyChart message sent to Dr. Ilda Basset.

## 2020-01-29 NOTE — Telephone Encounter (Signed)
I called Shelly Greene with Intepreter Eda Royal and notified her  Per Dr. Ilda Basset since she has not been taking her thyroid medicine and her level is normal; she does not need to take her medicine. Explained she does need follow up with her PCP as she may need to restart it and needs follow up care.  She states she does not have a PCP or insurance. I informed her we will refer her to [facility] . I gave her there phone number and address. She voices understanding and appreciation. Delaynie Stetzer,RN

## 2020-01-29 NOTE — Telephone Encounter (Signed)
Discussed with Dr. Ilda Basset while he is in the office. States if she has not been taking her thyroid medicine and her lab is normal she does not need to take it. She does need follow up with a PCP and we can refer her if needed.  Linda,RN

## 2020-02-27 ENCOUNTER — Ambulatory Visit: Payer: Self-pay | Attending: Family Medicine | Admitting: Family Medicine

## 2020-02-27 ENCOUNTER — Other Ambulatory Visit: Payer: Self-pay

## 2020-02-27 DIAGNOSIS — G939 Disorder of brain, unspecified: Secondary | ICD-10-CM

## 2020-02-27 DIAGNOSIS — E038 Other specified hypothyroidism: Secondary | ICD-10-CM

## 2020-02-27 MED ORDER — LEVOTHYROXINE SODIUM 50 MCG PO TABS: 50.0000 ug | ORAL_TABLET | Freq: Every day | ORAL | 5 refills | Status: DC

## 2020-02-27 NOTE — Progress Notes (Signed)
States that she feel weak, and has nausea she has these feeling when she is in the sun. States that she drinks a protein shake and she feels better.

## 2020-02-27 NOTE — Progress Notes (Signed)
Virtual Visit via Telephone Note  I connected with Shelly Greene, on 02/27/2020 at 11:05 AM by telephone due to the COVID-19 pandemic and verified that I am speaking with the correct person using two identifiers.   Consent: I discussed the limitations, risks, security and privacy concerns of performing an evaluation and management service by telephone and the availability of in person appointments. I also discussed with the patient that there may be a patient responsible charge related to this service. The patient expressed understanding and agreed to proceed.   Location of Patient: Home  Location of Provider: Clinic   Persons participating in Telemedicine visit: Sindee Lichtenberg ID [id number] Dr. Margarita Rana     History of Present Illness: Shelly Greene is a 39 year old female with a history of Hypothyroidism who presents to establish care.   She complains of nausea, weakness (in her eyes and head) intermittently and if she has to be in the bright light during these times,the light 'bothers her'. She denies presence of headaches with such episodes. A lot of movement in the kitchen makes her symptoms worse. This starts about 5-6 minutes after waking up. She had one of those episodes today and drinking a protein shake and going to lie down will bring about resolution. Episodes are unrelated to hunger. She endorses the room spinning as well but denies presence of hearing loss, tinnitus. Denies presence of seizures, gait abnormalities but she feels she is clumpsy, when she rides a bicycle she goes 'zigzag'  CT head in 2018 revealed: IMPRESSION: 1. No acute trauma to the head or cervical spine. 2. 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  Past Medical History:  Diagnosis Date  . Brain stem lesion    Being followed [facility] Neurosurgical Dept  . Gestational diabetes    . History of gestational diabetes 11/08/2016   Early a1c and CMP negative. Normal 3rd trimester 2 hr GTT.  Marland Kitchen Hypothyroidism   . PILAR CYST 02/27/2007   Qualifier: Diagnosis of  By: Walker Kehr MD, Patrick Jupiter    . 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 Vitamin D deficiency    No Known Allergies  Current Outpatient Medications on File Prior to Visit  Medication Sig Dispense Refill  . Norethindrone Acetate-Ethinyl Estrad-FE (LOESTRIN 24 FE) 1-20 MG-MCG(24) tablet Take 1 tablet by mouth daily. 1 Package 12  . azithromycin (ZITHROMAX) 250 MG tablet Take 1 tablet (250 mg total) by mouth daily. Take first 2 tablets together, then 1 every day until finished. (Patient not taking: Reported on 02/27/2020) 6 tablet 0  . benzonatate (TESSALON) 100 MG capsule Take 1 capsule (100 mg total) by mouth every 8 (eight) hours. (Patient not taking: Reported on 02/27/2020) 21 capsule 0  . diclofenac (VOLTAREN) 75 MG EC tablet Take 75 mg by mouth 2 (two) times daily.    Marland Kitchen levothyroxine (SYNTHROID, LEVOTHROID) 50 MCG tablet Take 1 tablet (50 mcg total) by mouth daily before breakfast. (Patient not taking: Reported on 02/27/2020) 60 tablet 5   No current facility-administered medications on file prior to visit.    Observations/Objective: Awake, alert, oriented x3 Not in acute distress  Lab Results  Component Value Date   TSH 2.230 01/22/2020    Assessment and Plan: 1. Brain stem lesion She will need repeat CT of her head given her ongoing symptoms Advised to apply for the [employer] financial discount to facilitate this referral We will need to refer to neurology and possibly neurosurgery after imaging -  CT Head Wo Contrast; Future  2. Other specified hypothyroidism Last TSH was normal Continue current dose of levothyroxine Additional labs at next in-person visit - levothyroxine (SYNTHROID) 50 MCG tablet; Take 1 tablet (50 mcg total) by mouth daily before breakfast.  Dispense: 60 tablet; Refill: 5   Follow Up Instructions: Return in  about 1 month (around 03/28/2020) for Follow-up of brain lesion-in person.    I discussed the assessment and treatment plan with the patient. The patient was provided an opportunity to ask questions and all were answered. The patient agreed with the plan and demonstrated an understanding of the instructions.   The patient was advised to call back or seek an in-person evaluation if the symptoms worsen or if the condition fails to improve as anticipated.     I provided 18 minutes total of non-face-to-face time during this encounter including median intraservice time, reviewing previous notes, investigations, ordering medications, medical decision making, coordinating care and patient verbalized understanding at the end of the visit.     Charlott Rakes, MD, FAAFP. [facility], [place]   02/27/2020, 11:05 AM

## 2020-03-10 ENCOUNTER — Other Ambulatory Visit: Payer: Self-pay

## 2020-03-10 ENCOUNTER — Ambulatory Visit (HOSPITAL_COMMUNITY)
Admission: RE | Admit: 2020-03-10 | Discharge: 2020-03-10 | Disposition: A | Discharge status: Home / Self Care | Payer: Self-pay | Source: Ambulatory Visit | Attending: Family Medicine | Admitting: Family Medicine

## 2020-03-10 DIAGNOSIS — G939 Disorder of brain, unspecified: Secondary | ICD-10-CM | POA: Insufficient documentation

## 2020-03-12 ENCOUNTER — Other Ambulatory Visit: Payer: Self-pay | Admitting: Family Medicine

## 2020-03-12 DIAGNOSIS — G939 Disorder of brain, unspecified: Secondary | ICD-10-CM

## 2020-03-24 ENCOUNTER — Ambulatory Visit: Payer: Self-pay | Attending: Family Medicine | Admitting: Family Medicine

## 2020-03-24 ENCOUNTER — Encounter: Payer: Self-pay | Admitting: Family Medicine

## 2020-03-24 ENCOUNTER — Other Ambulatory Visit: Payer: Self-pay

## 2020-03-24 VITALS — BP 99/62 | HR 75 | Ht 63.0 in | Wt 163.0 lb

## 2020-03-24 DIAGNOSIS — G9389 Other specified disorders of brain: Secondary | ICD-10-CM

## 2020-03-24 DIAGNOSIS — E038 Other specified hypothyroidism: Secondary | ICD-10-CM

## 2020-03-24 DIAGNOSIS — R5383 Other fatigue: Secondary | ICD-10-CM

## 2020-03-24 MED ORDER — LEVOTHYROXINE SODIUM 50 MCG PO TABS: 50.0000 ug | ORAL_TABLET | Freq: Every day | ORAL | 5 refills | Status: DC

## 2020-03-24 NOTE — Progress Notes (Signed)
Subjective:  Patient ID: Shelly Greene, female    DOB: 1981/02/12  Age: 39 y.o. MRN: [id number]  CC: No chief complaint on file.   HPI Shelly Greene is a 39 year old female with a history of hypertension, cerebral mass Noted on CT of her head in 2018.  At her visit with me last week when she establish care she had complained of increased clumsiness with associated gait abnormality. 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 The mass appears to have enlarged compared to the previous studies, maximal measurements suspected at about 2.6 cm compared with 2 cm previously. MRI could characterize more accurately.  She is not taking her Levothyroxine but complains of fatigue which is persistent even on minimal activity.  She denies syncope, dizziness or vertigo. Labs have revealed a normal thyroid panel.  Past Medical History:  Diagnosis Date  . Brain stem lesion    Being followed [facility] Neurosurgical Dept  . Gestational diabetes   . History of gestational diabetes 11/08/2016   Early a1c and CMP negative. Normal 3rd trimester 2 hr GTT.  Marland Kitchen Hypothyroidism   . PILAR CYST 02/27/2007   Qualifier: Diagnosis of  By: Walker Kehr MD, Patrick Jupiter    . 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 Vitamin D deficiency     Past Surgical History:  Procedure Laterality Date  . CESAREAN SECTION N/A 06/01/2017   Procedure: CESAREAN SECTION  Patient has Brain Stem Lesion, will require General anesthesia;  Surgeon: Lavonia Drafts, MD;  Location: [facility];  Service: Obstetrics;  Laterality: N/A;  . DILATION AND EVACUATION N/A 02/13/2015   Procedure: DILATATION AND EVACUATION;  Surgeon: Truett Mainland, DO;  Location: [facility];  Service: Gynecology;  Laterality: N/A;  . EYE SURGERY      Family History  Problem Relation Age of Onset  . Prostate  cancer Father     No Known Allergies  Outpatient Medications Prior to Visit  Medication Sig Dispense Refill  . Norethindrone Acetate-Ethinyl Estrad-FE (LOESTRIN 24 FE) 1-20 MG-MCG(24) tablet Take 1 tablet by mouth daily. 1 Package 12  . azithromycin (ZITHROMAX) 250 MG tablet Take 1 tablet (250 mg total) by mouth daily. Take first 2 tablets together, then 1 every day until finished. (Patient not taking: Reported on 02/27/2020) 6 tablet 0  . benzonatate (TESSALON) 100 MG capsule Take 1 capsule (100 mg total) by mouth every 8 (eight) hours. (Patient not taking: Reported on 02/27/2020) 21 capsule 0  . diclofenac (VOLTAREN) 75 MG EC tablet Take 75 mg by mouth 2 (two) times daily.    Marland Kitchen levothyroxine (SYNTHROID) 50 MCG tablet Take 1 tablet (50 mcg total) by mouth daily before breakfast. (Patient not taking: Reported on 03/24/2020) 60 tablet 5   No facility-administered medications prior to visit.     ROS Review of Systems  Constitutional: Positive for fatigue. Negative for activity change and appetite change.  HENT: Negative for congestion, sinus pressure and sore throat.   Eyes: Negative for visual disturbance.  Respiratory: Negative for cough, chest tightness, shortness of breath and wheezing.   Cardiovascular: Negative for chest pain and palpitations.  Gastrointestinal: Negative for abdominal distention, abdominal pain and constipation.  Endocrine: Negative for polydipsia.  Genitourinary: Negative for dysuria and frequency.  Musculoskeletal: Positive for gait problem. Negative for arthralgias and back pain.  Skin: Negative for rash.  Neurological: Negative for tremors, light-headedness and numbness.  Hematological: Does  not bruise/bleed easily.  Psychiatric/Behavioral: Negative for agitation and behavioral problems.    Objective:  BP 99/62   Pulse 75   Ht 5\' 3"  (1.6 m)   Wt 163 lb (73.9 kg)   SpO2 98%   BMI 28.87 kg/m   BP/Weight 03/24/2020 05/18/2019 [id number]  Systolic BP 99 [id number]    Diastolic BP 62 71 78  Wt. (Lbs) 163 145 137  BMI 28.87 25.69 24.27      Physical Exam Constitutional:      Appearance: She is well-developed.  Neck:     Vascular: No JVD.  Cardiovascular:     Rate and Rhythm: Normal rate.     Heart sounds: Normal heart sounds. No murmur.  Pulmonary:     Effort: Pulmonary effort is normal.     Breath sounds: Normal breath sounds. No wheezing or rales.  Chest:     Chest wall: No tenderness.  Abdominal:     General: Bowel sounds are normal. There is no distension.     Palpations: Abdomen is soft. There is no mass.     Tenderness: There is no abdominal tenderness.  Musculoskeletal:        General: Normal range of motion.     Right lower leg: No edema.     Left lower leg: No edema.  Neurological:     Mental Status: She is alert and oriented to person, place, and time.     Motor: No weakness.     Coordination: Finger-Nose-Finger Test normal.     Gait: Tandem walk abnormal. Gait normal.     Deep Tendon Reflexes: Reflexes normal.  Psychiatric:        Mood and Affect: Mood normal.     CMP Latest Ref Rng & Units 05/18/2019 09/27/2018 03/15/2017  Glucose 70 - 99 mg/dL 101(H) 119(H) 152(H)  BUN 6 - 20 mg/dL 6 10 7   Creatinine 0.44 - 1.00 mg/dL 0.60 0.70 0.52(L)  Sodium 135 - 145 mmol/L 139 136 137  Potassium 3.5 - 5.1 mmol/L 4.2 3.4(L) 3.9  Chloride 98 - 111 mmol/L 104 99 98  CO2 22 - 32 mmol/L 26 22 22   Calcium 8.9 - 10.3 mg/dL 8.7(L) 9.4 8.9  Total Protein 6.5 - 8.1 g/dL 6.6 7.9 6.3  Total Bilirubin 0.3 - 1.2 mg/dL 0.4 1.0 0.5  Alkaline Phos 38 - 126 U/L 51 78 80  AST 15 - 41 U/L 31 32 25  ALT 0 - 44 U/L 20 18 19     Lipid Panel     Component Value Date/Time   CHOL 150 06/09/2016 1025   TRIG 117 06/09/2016 1025   HDL 52 06/09/2016 1025   CHOLHDL 2.9 06/09/2016 1025   VLDL 23 06/09/2016 1025   LDLCALC 75 06/09/2016 1025    CBC    Component Value Date/Time   WBC 3.5 (L) 05/18/2019 1541   RBC 4.17 05/18/2019 1541   HGB 12.2  05/18/2019 1541   HGB 12.3 03/15/2017 0831   HCT 36.7 05/18/2019 1541   HCT 36.9 03/15/2017 0831   PLT 130 (L) 05/18/2019 1541   PLT 183 03/15/2017 0831   MCV 88.0 05/18/2019 1541   MCV 86 03/15/2017 0831   MCH 29.3 05/18/2019 1541   MCHC 33.2 05/18/2019 1541   RDW 12.6 05/18/2019 1541   RDW 14.0 03/15/2017 0831   LYMPHSABS 0.9 05/18/2019 1541   MONOABS 0.3 05/18/2019 1541   EOSABS 0.0 05/18/2019 1541   BASOSABS 0.0 05/18/2019 1541    Lab Results  Component Value Date   HGBA1C 5.3 11/24/2016    Assessment & Plan:  1. Other specified hypothyroidism Controlled - levothyroxine (SYNTHROID) 50 MCG tablet; Take 1 tablet (50 mcg total) by mouth daily before breakfast.  Dispense: 60 tablet; Refill: 5  2. Other fatigue Unknown etiology Could be secondary to being out of levothyroxine which I have refilled - VITAMIN D 25 Hydroxy (Vit-D Deficiency, Fractures) - CBC with Differential/Platelet  3. Cerebellar mass With associated gait abnormalities. I had referred her to neurosurgery and neurology but unfortunately she has no medical coverage for this She has an upcoming appointment with financial assistance department to facilitate her referral.   Return in about 3 months (around 06/24/2020) for coordination of care.    Charlott Rakes, MD, FAAFP. [facility] and [place], [place]   03/24/2020, 1:54 PM

## 2020-03-24 NOTE — Progress Notes (Signed)
Follow up from 02/27/2020 telephone visit.

## 2020-03-25 ENCOUNTER — Encounter: Payer: Self-pay | Admitting: *Deleted

## 2020-03-25 LAB — CBC WITH DIFFERENTIAL/PLATELET
Basophils Absolute: 0.1 10*3/uL (ref 0.0–0.2)
Basos: 1 %
EOS (ABSOLUTE): 0.3 10*3/uL (ref 0.0–0.4)
Eos: 4 %
Hematocrit: 40.3 % (ref 34.0–46.6)
Hemoglobin: 13.4 g/dL (ref 11.1–15.9)
Immature Grans (Abs): 0 10*3/uL (ref 0.0–0.1)
Immature Granulocytes: 0 %
Lymphocytes Absolute: 2.2 10*3/uL (ref 0.7–3.1)
Lymphs: 29 %
MCH: 29.6 pg (ref 26.6–33.0)
MCHC: 33.3 g/dL (ref 31.5–35.7)
MCV: 89 fL (ref 79–97)
Monocytes Absolute: 0.5 10*3/uL (ref 0.1–0.9)
Monocytes: 7 %
Neutrophils Absolute: 4.5 10*3/uL (ref 1.4–7.0)
Neutrophils: 59 %
Platelets: 208 10*3/uL (ref 150–450)
RBC: 4.53 x10E6/uL (ref 3.77–5.28)
RDW: 12.3 % (ref 11.7–15.4)
WBC: 7.6 10*3/uL (ref 3.4–10.8)

## 2020-03-25 LAB — VITAMIN D 25 HYDROXY (VIT D DEFICIENCY, FRACTURES): Vit D, 25-Hydroxy: 33.1 ng/mL (ref 30.0–100.0)

## 2020-03-26 ENCOUNTER — Telehealth: Payer: Self-pay

## 2020-03-26 NOTE — Telephone Encounter (Signed)
-----   Message from Charlott Rakes, MD sent at 03/25/2020  8:22 AM EDT ----- Please inform her labs are normal and her fatigue might be as a result of running out of her levothyroxine pills which I will need her to restart.

## 2020-03-26 NOTE — Telephone Encounter (Signed)
Patient called in and requested for lab results. Patient was identified by 2 patient identifiers. Labs were given. Patient verbalized understanding and had no further questions or concerns at this time.

## 2020-03-26 NOTE — Telephone Encounter (Signed)
Patient was called and a voicemail was left informing patient to return phone call for lab results. 

## 2020-03-30 ENCOUNTER — Ambulatory Visit: Payer: Self-pay

## 2020-04-03 ENCOUNTER — Telehealth: Payer: Self-pay

## 2020-04-03 NOTE — Telephone Encounter (Signed)
Patient was called and a voicemail was left informing patient to return phone call for lab results.  A letter has been mailed out.

## 2020-04-03 NOTE — Telephone Encounter (Signed)
-----   Message from Charlott Rakes, MD sent at 03/25/2020  8:22 AM EDT ----- Please inform her labs are normal and her fatigue might be as a result of running out of her levothyroxine pills which I will need her to restart.

## 2020-04-10 ENCOUNTER — Encounter: Payer: Self-pay | Admitting: Family Medicine

## 2020-04-10 ENCOUNTER — Ambulatory Visit: Payer: Self-pay | Attending: Family Medicine | Admitting: Family Medicine

## 2020-04-10 ENCOUNTER — Other Ambulatory Visit: Payer: Self-pay

## 2020-04-10 DIAGNOSIS — J683 Other acute and subacute respiratory conditions due to chemicals, gases, fumes and vapors: Secondary | ICD-10-CM

## 2020-04-10 DIAGNOSIS — J0100 Acute maxillary sinusitis, unspecified: Secondary | ICD-10-CM

## 2020-04-10 DIAGNOSIS — N3000 Acute cystitis without hematuria: Secondary | ICD-10-CM

## 2020-04-10 DIAGNOSIS — R300 Dysuria: Secondary | ICD-10-CM

## 2020-04-10 LAB — POCT URINALYSIS DIP (CLINITEK)
Bilirubin, UA: NEGATIVE
Blood, UA: NEGATIVE
Glucose, UA: NEGATIVE mg/dL
Ketones, POC UA: NEGATIVE mg/dL
Leukocytes, UA: NEGATIVE
Nitrite, UA: NEGATIVE
POC PROTEIN,UA: NEGATIVE
Spec Grav, UA: >=1.03 — AB
Urobilinogen, UA: 0.2 U/dL
pH, UA: 5.5

## 2020-04-10 MED ORDER — PREDNISONE 20 MG PO TABS: 40.0000 mg | ORAL_TABLET | Freq: Every day | ORAL | 0 refills | Status: DC

## 2020-04-10 MED ORDER — AMOXICILLIN-POT CLAVULANATE 500-125 MG PO TABS: 1.0000 | ORAL_TABLET | Freq: Two times a day (BID) | ORAL | 0 refills | Status: DC

## 2020-04-10 NOTE — Progress Notes (Signed)
Virtual Visit via Telephone Note  I connected with Shelly Greene on 04/10/20 at  9:10 AM EDT by telephone and verified that I am speaking with the correct person using two identifiers.   I discussed the limitations, risks, security and privacy concerns of performing an evaluation and management service by telephone and the availability of in person appointments. I also discussed with the patient that there may be a patient responsible charge related to this service. The patient expressed understanding and agreed to proceed.  Patient Location: Home Provider Location: [facility] Others participating in call: Call initiated by Emilio Aspen, RMA who then transferred the call to me   History of Present Illness:        39 year old female with complaint of approximately 2 weeks of increased nasal congestion but over the past few days she has also developed a nighttime cough as well as green nasal discharge and yesterday she was having pain in her upper cheeks/teeth.  She does have sensation of postnasal drainage and throat irritation.  She denies any fever or chills and no abdominal pain.  She does have frontal headache, occasional dizziness.  Also yesterday, she noticed increased frequency of urination and onset of burning with urination and patient saw blood x1 when wiping after passing urine.  She also feels as if she has chest congestion and wheezing especially if she is talking.  She reports history of allergic rhinitis/allergies which sometimes cause her to have increased shortness of breath and wheezing.  She does not believe that she has had a formal diagnosis of asthma in the past.  She denies any nausea or increased back pain.  No possibility of pregnancy per patient as she is currently on birth control pills.   Past Medical History:  Diagnosis Date  . Brain stem lesion    Being followed [facility] Neurosurgical Dept  . Gestational diabetes   . History of gestational diabetes  11/08/2016   Early a1c and CMP negative. Normal 3rd trimester 2 hr GTT.  Marland Kitchen Hypothyroidism   . PILAR CYST 02/27/2007   Qualifier: Diagnosis of  By: Walker Kehr MD, Patrick Jupiter    . 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 Vitamin D deficiency     Past Surgical History:  Procedure Laterality Date  . CESAREAN SECTION N/A 06/01/2017   Procedure: CESAREAN SECTION  Patient has Brain Stem Lesion, will require General anesthesia;  Surgeon: Lavonia Drafts, MD;  Location: [facility];  Service: Obstetrics;  Laterality: N/A;  . DILATION AND EVACUATION N/A 02/13/2015   Procedure: DILATATION AND EVACUATION;  Surgeon: Truett Mainland, DO;  Location: [facility];  Service: Gynecology;  Laterality: N/A;  . EYE SURGERY      Family History  Problem Relation Age of Onset  . Prostate cancer Father     Social History   Tobacco Use  . Smoking status: Never Smoker  . Smokeless tobacco: Never Used  Substance Use Topics  . Alcohol use: No    Alcohol/week: 0.0 standard drinks  . Drug use: No     No Known Allergies     Observations/Objective: No vital signs or physical exam conducted as visit was done via telephone  Assessment and Plan: 1. Dysuria She reports that she has already stopped by the office this morning to give urine sample.  Patient will also have urine sent for culture. - POCT URINALYSIS DIP (CLINITEK) - Urine Culture  2. Acute maxillary sinusitis, recurrence not specified Patient with acute maxillary sinusitis for which she will be  treated with Augmentin.  She has been encouraged to also take an over-the-counter antihistamine to help with postnasal drainage.  Rest and remain well-hydrated. - amoxicillin-clavulanate (AUGMENTIN) 500-125 MG tablet; Take 1 tablet (500 mg total) by mouth 2 (two) times daily. After a meal  Dispense: 20 tablet; Refill: 0  3. Acute cystitis with hematuria Amoxicillin: Clavulanic acid/Augmentin should also treat urinary tract infection but urine will be sent for culture and if a  different antibiotic is needed based on the culture, patient will be notified.  She is encouraged to remain well-hydrated and rest.  UA results were not available until after patient's visit. - Urine Culture - amoxicillin-clavulanate (AUGMENTIN) 500-125 MG tablet; Take 1 tablet (500 mg total) by mouth 2 (two) times daily. After a meal  Dispense: 20 tablet; Refill: 0  4. 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  Prednisone should also help with facial pressure due to sinusitis. - predniSONE (DELTASONE) 20 MG tablet; Take 2 tablets (40 mg total) by mouth daily with breakfast.  Dispense: 10 tablet; Refill: 0  Follow Up Instructions:Return for Nasal congestion/wheezing-next week if not better.    I discussed the assessment and treatment plan with the patient. The patient was provided an opportunity to ask questions and all were answered. The patient agreed with the plan and demonstrated an understanding of the instructions.   The patient was advised to call back or seek an in-person evaluation if the symptoms worsen or if the condition fails to improve as anticipated.  I provided 7 minutes of non-face-to-face time during this encounter and an additional 5 minutes for completion of today's visit note.   Antony Blackbird, MD

## 2020-04-10 NOTE — Progress Notes (Signed)
Cough at night,  Started Saturday, Sore Throat and Congestion due to son being sick, per pt her sx are up and down, Per pt this morning she have been feeling so sleepy and fatigue, Sinus pressure and allergies med is not working.   Yesterday morning she was in the bathroom she had a little blood and she stated as the day went by it was burning when she pee. Per pt this morning she do not have any burning sx or itching or no smell.

## 2020-04-12 LAB — URINE CULTURE

## 2020-09-15 ENCOUNTER — Ambulatory Visit: Payer: Self-pay

## 2020-11-04 ENCOUNTER — Ambulatory Visit: Payer: Self-pay | Admitting: *Deleted

## 2020-11-04 NOTE — Telephone Encounter (Signed)
Patient is calling to report that she had congestion and fever. She states that she is have R eye swelling with blurred vision due to her history of brain lesion. She states this occurs with fever every time. Patient does not have thermometer- she she can not give accurate temperature. During scheduling process- call dropped- attempted to call patient back and left message for her to call office.   Reason for Disposition  [1] MILD swelling AND [2] fever  Answer Assessment - Initial Assessment Questions 1. ONSET: "When did the swelling start?" (e.g., minutes, hours, days)     Patient has brain lesion- she always had blurred vision with fever 2. LOCATION: "What part of the eyelids is swollen?"     Eyelids swollen and puffy- R eye 3. SEVERITY: "How swollen is it?"     Bottom of eye is swollen 4. ITCHING: "Is there any itching?" If Yes, ask: "How much?"   (Scale 1-10; mild, moderate or severe)     no 5. PAIN: "Is the swelling painful to touch?" If Yes, ask: "How painful is it?"   (Scale 1-10; mild, moderate or severe)     no 6. FEVER: "Do you have a fever?" If Yes, ask: "What is it, how was it measured, and when did it start?"      Does not have thermometer- is having chills and sweating- she feels fever is going down- she is treating with ibuprofen 7. CAUSE: "What do you think is causing the swelling?"     fever 8. RECURRENT SYMPTOM: "Have you had eyelid swelling before?" If Yes, ask: "When was the last time?" "What happened that time?"     Yes- patient has brain lesion 9. OTHER SYMPTOMS: "Do you have any other symptoms?" (e.g., blurred vision, eye discharge, rash, runny nose)     Blurred vision, fever, nasal drainage- clear 10. PREGNANCY: "Is there any chance you are pregnant?" "When was your last menstrual period?"       No- BTL  Protocols used: EYE - [facility]

## 2020-11-04 NOTE — Telephone Encounter (Signed)
Attempted to call patient back- left message on voice mail to call office

## 2020-11-04 NOTE — Telephone Encounter (Signed)
Please schedule patient a virtual appointment with any available provider.

## 2020-11-05 ENCOUNTER — Telehealth: Payer: Self-pay | Admitting: Family Medicine

## 2020-11-05 NOTE — Telephone Encounter (Signed)
Called patient with interpreter services letting her know that we could schedule a virtual visit with the mobile medicine unit today. Advised patient to call [phone] to schedule.

## 2020-12-04 ENCOUNTER — Telehealth (INDEPENDENT_AMBULATORY_CARE_PROVIDER_SITE_OTHER): Payer: Self-pay | Admitting: Primary Care

## 2020-12-04 ENCOUNTER — Other Ambulatory Visit: Payer: Self-pay

## 2020-12-04 ENCOUNTER — Encounter (INDEPENDENT_AMBULATORY_CARE_PROVIDER_SITE_OTHER): Payer: Self-pay | Admitting: Primary Care

## 2020-12-04 DIAGNOSIS — R0981 Nasal congestion: Secondary | ICD-10-CM

## 2020-12-04 NOTE — Progress Notes (Signed)
Has been sick with since Monday  Covid test not resulted No fever, HA, or body aches Has nasal congestion, ear pain Wanted to see if she can have ATB, thinks it's a sinus infection.   Taking ibuprofen and DayQuil  LMP- 09/2020

## 2020-12-04 NOTE — Progress Notes (Signed)
Telephone Note  I connected with Shelly Greene on 12/04/20 at  9:10 AM EST by telephone and verified that I am speaking with the correct person using two identifiers.  Location Patient: home  Provider: Kerin Perna RFM   I discussed the limitations, risks, security and privacy concerns of performing an evaluation and management service by telephone and the availability of in person appointments. I also discussed with the patient that there may be a patient responsible charge related to this service. The patient expressed understanding and agreed to proceed.   History of Present Illness: Ms. Shelly Greene she has been sick with since Monday, Covid test not resulted, No fever, HA, or body aches. She does have nasal congestion, ear pain .  Patient is requesting antibiotics to treat what she feels is a sinus infection.  Currently taking ibuprofen and DayQuil with little relief .  No treatment can be rendered until test resulted treat signs and symptoms with over-the-counter medications as she currently is doing.   Past Medical History:  Diagnosis Date  . Brain stem lesion    Being followed [facility] Neurosurgical Dept  . Gestational diabetes   . History of gestational diabetes 11/08/2016   Early a1c and CMP negative. Normal 3rd trimester 2 hr GTT.  Marland Kitchen Hypothyroidism   . PILAR CYST 02/27/2007   Qualifier: Diagnosis of  By: Walker Kehr MD, Patrick Jupiter    . 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 Vitamin D deficiency    Current Outpatient Medications on File Prior to Visit  Medication Sig Dispense Refill  . levothyroxine (SYNTHROID) 50 MCG tablet Take 1 tablet (50 mcg total) by mouth daily before breakfast. 60 tablet 5  . Norethindrone Acetate-Ethinyl Estrad-FE (LOESTRIN 24 FE) 1-20 MG-MCG(24) tablet Take 1 tablet by mouth daily. 1 Package 12  . amoxicillin-clavulanate (AUGMENTIN) 500-125 MG tablet Take 1 tablet (500 mg total) by mouth 2 (two) times daily. After a meal 20 tablet 0  . azithromycin  (ZITHROMAX) 250 MG tablet Take 1 tablet (250 mg total) by mouth daily. Take first 2 tablets together, then 1 every day until finished. (Patient not taking: Reported on 02/27/2020) 6 tablet 0  . benzonatate (TESSALON) 100 MG capsule Take 1 capsule (100 mg total) by mouth every 8 (eight) hours. (Patient not taking: Reported on 02/27/2020) 21 capsule 0  . predniSONE (DELTASONE) 20 MG tablet Take 2 tablets (40 mg total) by mouth daily with breakfast. 10 tablet 0   No current facility-administered medications on file prior to visit.   Observations/Objective: Pertinent positive and negative listed in HPI.  Assessment and Plan: Diagnoses and all orders for this visit:  Nasal congestion Encourage patient to wait for results from testing.  With this omicron variant it can present as a simple cold or sinusitis.  Ask for self examination palpate forehead for any pain-negative nasal passages to use nontender but congested and from ears to neck no bulging areas.  At this time may use over-the-counter medication for treatment and if results are negative.  Reevaluate for sinus infection.   Follow Up Instructions:    I discussed the assessment and treatment plan with the patient. The patient was provided an opportunity to ask questions and all were answered. The patient agreed with the plan and demonstrated an understanding of the instructions.   The patient was advised to call back or seek an in-person evaluation if the symptoms worsen or if the condition fails to improve as anticipated.  I provided 15 minutes of non-face-to-face time during this  encounter.   Kerin Perna, NP

## 2021-03-24 ENCOUNTER — Other Ambulatory Visit: Payer: Self-pay | Admitting: Family Medicine

## 2021-03-24 DIAGNOSIS — E038 Other specified hypothyroidism: Secondary | ICD-10-CM

## 2021-03-24 DIAGNOSIS — N945 Secondary dysmenorrhea: Secondary | ICD-10-CM

## 2021-03-24 NOTE — Telephone Encounter (Signed)
Medication Refill - Medication: Norethindrone Acetate-Ethinyl Estrad-FE (LOESTRIN 24 FE) 1-20 MG-MCG(24) tablet and levothyroxine (SYNTHROID) 50 MCG tablet     Has the patient contacted their pharmacy? Yes.    (Agent: If yes, when and what did the pharmacy advise?) Contact PCP office because request was already sent  Preferred Pharmacy (with phone number or street name): [facility] [address] [place], [place] [address] [phone]  Agent: Please be advised that RX refills may take up to 3 business days. We ask that you follow-up with your pharmacy.

## 2021-03-26 MED ORDER — NORETHIN ACE-ETH ESTRAD-FE 1-20 MG-MCG(24) PO TABS: 1.0000 | ORAL_TABLET | Freq: Every day | ORAL | 11 refills | Status: DC

## 2021-03-26 MED ORDER — LEVOTHYROXINE SODIUM 50 MCG PO TABS: 50.0000 ug | ORAL_TABLET | Freq: Every day | ORAL | 5 refills | Status: DC

## 2021-04-29 ENCOUNTER — Ambulatory Visit: Payer: Self-pay | Admitting: Physician Assistant

## 2021-05-05 ENCOUNTER — Encounter: Payer: Self-pay | Admitting: Family Medicine

## 2021-05-05 ENCOUNTER — Ambulatory Visit: Payer: Self-pay | Attending: Family Medicine | Admitting: Family Medicine

## 2021-05-05 ENCOUNTER — Other Ambulatory Visit: Payer: Self-pay

## 2021-05-05 VITALS — BP 99/65 | HR 72 | Ht 63.0 in | Wt 161.8 lb

## 2021-05-05 DIAGNOSIS — Z124 Encounter for screening for malignant neoplasm of cervix: Secondary | ICD-10-CM

## 2021-05-05 DIAGNOSIS — E038 Other specified hypothyroidism: Secondary | ICD-10-CM

## 2021-05-05 DIAGNOSIS — Z1159 Encounter for screening for other viral diseases: Secondary | ICD-10-CM

## 2021-05-05 DIAGNOSIS — Z1231 Encounter for screening mammogram for malignant neoplasm of breast: Secondary | ICD-10-CM

## 2021-05-05 DIAGNOSIS — Z0000 Encounter for general adult medical examination without abnormal findings: Secondary | ICD-10-CM

## 2021-05-05 NOTE — Progress Notes (Signed)
Subjective:  Patient ID: Shelly Greene, female    DOB: 06-Jan-1981  Age: 40 y.o. MRN: [id number]  CC: Annual Exam and Gynecologic Exam   HPI Shelly Greene is a 40 year old female with a history of hypothyroidism, brainstem lesion here for an annual physical exam. She has been unable to follow-up with Neurosurgery due to lack of medical coverage.  States she does have some gait abnormalities on some days but has learned to live with it.  I had referred her to neurology who declined referral.  Interval History: She has been on OCP for dysmenorrhea but feels she no longer needs it. She is due for Pap smear and mammogram today. Compliant with her levothyroxine for hypothyroidism. Past Medical History:  Diagnosis Date   Brain stem lesion    Being followed [facility] Neurosurgical Dept   Gestational diabetes    History of gestational diabetes 11/08/2016   Early a1c and CMP negative. Normal 3rd trimester 2 hr GTT.   Hypothyroidism    PILAR CYST 02/27/2007   Qualifier: Diagnosis of  By: Walker Kehr MD, [place]     Ptosis 06/09/2016   Vitamin D deficiency     Past Surgical History:  Procedure Laterality Date   CESAREAN SECTION N/A 06/01/2017   Procedure: CESAREAN SECTION  Patient has Brain Stem Lesion, will require General anesthesia;  Surgeon: Lavonia Drafts, MD;  Location: [place];  Service: Obstetrics;  Laterality: N/A;   DILATION AND EVACUATION N/A 02/13/2015   Procedure: DILATATION AND EVACUATION;  Surgeon: Truett Mainland, DO;  Location: [facility];  Service: Gynecology;  Laterality: N/A;   EYE SURGERY      Family History  Problem Relation Age of Onset   Prostate cancer Father     No Known Allergies  Outpatient Medications Prior to Visit  Medication Sig Dispense Refill   levothyroxine (SYNTHROID) 50 MCG tablet Take 1 tablet (50 mcg total) by mouth daily before breakfast. 60 tablet 5   amoxicillin-clavulanate (AUGMENTIN) 500-125 MG tablet Take 1 tablet  (500 mg total) by mouth 2 (two) times daily. After a meal (Patient not taking: Reported on 05/05/2021) 20 tablet 0   azithromycin (ZITHROMAX) 250 MG tablet Take 1 tablet (250 mg total) by mouth daily. Take first 2 tablets together, then 1 every day until finished. (Patient not taking: No sig reported) 6 tablet 0   benzonatate (TESSALON) 100 MG capsule Take 1 capsule (100 mg total) by mouth every 8 (eight) hours. (Patient not taking: No sig reported) 21 capsule 0   Norethindrone Acetate-Ethinyl Estrad-FE (LOESTRIN 24 FE) 1-20 MG-MCG(24) tablet Take 1 tablet by mouth daily. (Patient not taking: Reported on 05/05/2021) 30 tablet 11   predniSONE (DELTASONE) 20 MG tablet Take 2 tablets (40 mg total) by mouth daily with breakfast. (Patient not taking: Reported on 05/05/2021) 10 tablet 0   No facility-administered medications prior to visit.     ROS Review of Systems  Constitutional:  Negative for activity change, appetite change and fatigue.  HENT:  Negative for congestion, sinus pressure and sore throat.   Eyes:  Negative for visual disturbance.  Respiratory:  Negative for cough, chest tightness, shortness of breath and wheezing.   Cardiovascular:  Negative for chest pain and palpitations.  Gastrointestinal:  Negative for abdominal distention, abdominal pain and constipation.  Endocrine: Negative for polydipsia.  Genitourinary:  Negative for dysuria and frequency.  Musculoskeletal:  Negative for arthralgias and back pain.  Skin:  Negative for rash.  Neurological:  Negative for tremors, light-headedness and  numbness.  Hematological:  Does not bruise/bleed easily.  Psychiatric/Behavioral:  Negative for agitation and behavioral problems.    Objective:  BP 99/65   Pulse 72   Ht $R'5\' 3"'ae$  (1.6 m)   Wt 161 lb 12.8 oz (73.4 kg)   SpO2 98%   BMI 28.66 kg/m   BP/Weight 05/05/2021 03/24/2020 6/80/3212  Systolic BP 99 99 248  Diastolic BP 65 62 71  Wt. (Lbs) 161.8 163 145  BMI 28.66 28.87 25.69       Physical Exam Constitutional:      General: She is not in acute distress.    Appearance: She is well-developed. She is not diaphoretic.  HENT:     Head: Normocephalic.     Right Ear: External ear normal.     Left Ear: External ear normal.     Nose: Nose normal.  Eyes:     Conjunctiva/sclera: Conjunctivae normal.     Pupils: Pupils are equal, round, and reactive to light.  Neck:     Vascular: No JVD.  Cardiovascular:     Rate and Rhythm: Normal rate and regular rhythm.     Heart sounds: Normal heart sounds. No murmur heard.   No gallop.  Pulmonary:     Effort: Pulmonary effort is normal. No respiratory distress.     Breath sounds: Normal breath sounds. No wheezing or rales.  Chest:     Chest wall: No tenderness.  Breasts:    Right: No mass or tenderness.     Left: No mass or tenderness.  Abdominal:     General: Bowel sounds are normal. There is no distension.     Palpations: Abdomen is soft. There is no mass.     Tenderness: There is no abdominal tenderness.  Genitourinary:    Comments: External genitalia, vagina, cervix-normal Musculoskeletal:        General: No tenderness. Normal range of motion.     Cervical back: Normal range of motion.  Skin:    General: Skin is warm and dry.  Neurological:     Mental Status: She is alert and oriented to person, place, and time.     Deep Tendon Reflexes: Reflexes are normal and symmetric.    CMP Latest Ref Rng & Units 05/18/2019 09/27/2018 03/15/2017  Glucose 70 - 99 mg/dL 101(H) 119(H) 152(H)  BUN 6 - 20 mg/dL $Remove'6 10 7  'LKeBVCn$ Creatinine 0.44 - 1.00 mg/dL 0.60 0.70 0.52(L)  Sodium 135 - 145 mmol/L 139 136 137  Potassium 3.5 - 5.1 mmol/L 4.2 3.4(L) 3.9  Chloride 98 - 111 mmol/L 104 99 98  CO2 22 - 32 mmol/L $RemoveB'26 22 22  'LzSKouuD$ Calcium 8.9 - 10.3 mg/dL 8.7(L) 9.4 8.9  Total Protein 6.5 - 8.1 g/dL 6.6 7.9 6.3  Total Bilirubin 0.3 - 1.2 mg/dL 0.4 1.0 0.5  Alkaline Phos 38 - 126 U/L 51 78 80  AST 15 - 41 U/L 31 32 25  ALT 0 - 44 U/L $Remo'20 18  19    'bYPLC$ Lipid Panel     Component Value Date/Time   CHOL 150 06/09/2016 1025   TRIG 117 06/09/2016 1025   HDL 52 06/09/2016 1025   CHOLHDL 2.9 06/09/2016 1025   VLDL 23 06/09/2016 1025   LDLCALC 75 06/09/2016 1025    CBC    Component Value Date/Time   WBC 7.6 03/24/2020 1416   WBC 3.5 (L) 05/18/2019 1541   RBC 4.53 03/24/2020 1416   RBC 4.17 05/18/2019 1541   HGB 13.4 03/24/2020 1416  HCT 40.3 03/24/2020 1416   PLT 208 03/24/2020 1416   MCV 89 03/24/2020 1416   MCH 29.6 03/24/2020 1416   MCH 29.3 05/18/2019 1541   MCHC 33.3 03/24/2020 1416   MCHC 33.2 05/18/2019 1541   RDW 12.3 03/24/2020 1416   LYMPHSABS 2.2 03/24/2020 1416   MONOABS 0.3 05/18/2019 1541   EOSABS 0.3 03/24/2020 1416   BASOSABS 0.1 03/24/2020 1416    Lab Results  Component Value Date   HGBA1C 5.3 11/24/2016    Lab Results  Component Value Date   TSH 2.230 01/22/2020    Assessment & Plan:  1. Annual physical exam Counseled on 150 minutes of exercise per week, healthy eating (including decreased daily intake of saturated fats, cholesterol, added sugars, sodium), STI prevention, routine healthcare maintenance. - CMP14+EGFR - Lipid panel - Hemoglobin A1c - CBC with Differential/Platelet  2. Encounter for screening mammogram for malignant neoplasm of breast - MM 3D SCREEN BREAST BILATERAL; Future  3. Screening for cervical cancer - Cytology - PAP()  4. Need for hepatitis C screening test - HCV RNA quant rflx ultra or genotyp([facility])  5. Other specified hypothyroidism Controlled Last TSH was in 01/2020 Will repeat level Continue current dose of levothyroxine and will adjust regimen accordingly - T4, free - TSH    No orders of the defined types were placed in this encounter.   Follow-up: No follow-ups on file.       Charlott Rakes, MD, FAAFP. [employer] and [place], [place]   05/05/2021, 10:40 AM

## 2021-05-05 NOTE — Patient Instructions (Signed)
Health Maintenance, Female Adopting a healthy lifestyle and getting preventive care are important in promoting health and wellness. Ask your health care provider about: The right schedule for you to have regular tests and exams. Things you can do on your own to prevent diseases and keep yourself healthy. What should I know about diet, weight, and exercise? Eat a healthy diet  Eat a diet that includes plenty of vegetables, fruits, low-fat dairy products, and lean protein. Do not eat a lot of foods that are high in solid fats, added sugars, or sodium.  Maintain a healthy weight Body mass index (BMI) is used to identify weight problems. It estimates body fat based on height and weight. Your health care provider can help determineyour BMI and help you achieve or maintain a healthy weight. Get regular exercise Get regular exercise. This is one of the most important things you can do for your health. Most adults should: Exercise for at least 150 minutes each week. The exercise should increase your heart rate and make you sweat (moderate-intensity exercise). Do strengthening exercises at least twice a week. This is in addition to the moderate-intensity exercise. Spend less time sitting. Even light physical activity can be beneficial. Watch cholesterol and blood lipids Have your blood tested for lipids and cholesterol at 41 years of age, then havethis test every 5 years. Have your cholesterol levels checked more often if: Your lipid or cholesterol levels are high. You are older than 40 years of age. You are at high risk for heart disease. What should I know about cancer screening? Depending on your health history and family history, you may need to have cancer screening at various ages. This may include screening for: Breast cancer. Cervical cancer. Colorectal cancer. Skin cancer. Lung cancer. What should I know about heart disease, diabetes, and high blood pressure? Blood pressure and heart  disease High blood pressure causes heart disease and increases the risk of stroke. This is more likely to develop in people who have high blood pressure readings, are of African descent, or are overweight. Have your blood pressure checked: Every 3-5 years if you are 18-39 years of age. Every year if you are 40 years old or older. Diabetes Have regular diabetes screenings. This checks your fasting blood sugar level. Have the screening done: Once every three years after age 40 if you are at a normal weight and have a low risk for diabetes. More often and at a younger age if you are overweight or have a high risk for diabetes. What should I know about preventing infection? Hepatitis B If you have a higher risk for hepatitis B, you should be screened for this virus. Talk with your health care provider to find out if you are at risk forhepatitis B infection. Hepatitis C Testing is recommended for: Everyone born from 1945 through 1965. Anyone with known risk factors for hepatitis C. Sexually transmitted infections (STIs) Get screened for STIs, including gonorrhea and chlamydia, if: You are sexually active and are younger than 41 years of age. You are older than 41 years of age and your health care provider tells you that you are at risk for this type of infection. Your sexual activity has changed since you were last screened, and you are at increased risk for chlamydia or gonorrhea. Ask your health care provider if you are at risk. Ask your health care provider about whether you are at high risk for HIV. Your health care provider may recommend a prescription medicine to help   prevent HIV infection. If you choose to take medicine to prevent HIV, you should first get tested for HIV. You should then be tested every 3 months for as long as you are taking the medicine. Pregnancy If you are about to stop having your period (premenopausal) and you may become pregnant, seek counseling before you get  pregnant. Take 400 to 800 micrograms (mcg) of folic acid every day if you become pregnant. Ask for birth control (contraception) if you want to prevent pregnancy. Osteoporosis and menopause Osteoporosis is a disease in which the bones lose minerals and strength with aging. This can result in bone fractures. If you are 65 years old or older, or if you are at risk for osteoporosis and fractures, ask your health care provider if you should: Be screened for bone loss. Take a calcium or vitamin D supplement to lower your risk of fractures. Be given hormone replacement therapy (HRT) to treat symptoms of menopause. Follow these instructions at home: Lifestyle Do not use any products that contain nicotine or tobacco, such as cigarettes, e-cigarettes, and chewing tobacco. If you need help quitting, ask your health care provider. Do not use street drugs. Do not share needles. Ask your health care provider for help if you need support or information about quitting drugs. Alcohol use Do not drink alcohol if: Your health care provider tells you not to drink. You are pregnant, may be pregnant, or are planning to become pregnant. If you drink alcohol: Limit how much you use to 0-1 drink a day. Limit intake if you are breastfeeding. Be aware of how much alcohol is in your drink. In the [place] one drink equals one 12 oz bottle of beer (355 mL), one 5 oz glass of wine (148 mL), or one 1 oz glass of hard liquor (44 mL). General instructions Schedule regular health, dental, and eye exams. Stay current with your vaccines. Tell your health care provider if: You often feel depressed. You have ever been abused or do not feel safe at home. Summary Adopting a healthy lifestyle and getting preventive care are important in promoting health and wellness. Follow your health care provider's instructions about healthy diet, exercising, and getting tested or screened for diseases. Follow your health care provider's  instructions on monitoring your cholesterol and blood pressure. This information is not intended to replace advice given to you by your health care provider. Make sure you discuss any questions you have with your healthcare provider. Document Revised: 10/31/2018 Document Reviewed: 10/31/2018 Elsevier Patient Education  2022 Elsevier Inc.  

## 2021-05-05 NOTE — Progress Notes (Signed)
Discuss birth control pills. Physical/pap

## 2021-05-07 LAB — CBC WITH DIFFERENTIAL/PLATELET
Basophils Absolute: 0.1 10*3/uL (ref 0.0–0.2)
Basos: 1 %
EOS (ABSOLUTE): 0.1 10*3/uL (ref 0.0–0.4)
Eos: 1 %
Hematocrit: 39.9 % (ref 34.0–46.6)
Hemoglobin: 13.3 g/dL (ref 11.1–15.9)
Immature Grans (Abs): 0 10*3/uL (ref 0.0–0.1)
Immature Granulocytes: 0 %
Lymphocytes Absolute: 1.9 10*3/uL (ref 0.7–3.1)
Lymphs: 29 %
MCH: 28.7 pg (ref 26.6–33.0)
MCHC: 33.3 g/dL (ref 31.5–35.7)
MCV: 86 fL (ref 79–97)
Monocytes Absolute: 0.5 10*3/uL (ref 0.1–0.9)
Monocytes: 7 %
Neutrophils Absolute: 4 10*3/uL (ref 1.4–7.0)
Neutrophils: 62 %
Platelets: 198 10*3/uL (ref 150–450)
RBC: 4.63 x10E6/uL (ref 3.77–5.28)
RDW: 12.4 % (ref 11.7–15.4)
WBC: 6.5 10*3/uL (ref 3.4–10.8)

## 2021-05-07 LAB — HCV RNA QUANT RFLX ULTRA OR GENOTYP: HCV Quant Baseline: NOT DETECTED [IU]/mL

## 2021-05-07 LAB — TSH: TSH: 1.82 u[IU]/mL (ref 0.450–4.500)

## 2021-05-07 LAB — CMP14+EGFR
ALT: 19 IU/L (ref 0–32)
AST: 24 IU/L (ref 0–40)
Albumin/Globulin Ratio: 1.9 (ref 1.2–2.2)
Albumin: 4.6 g/dL (ref 3.8–4.8)
Alkaline Phosphatase: 63 IU/L (ref 44–121)
BUN/Creatinine Ratio: 14 (ref 9–23)
BUN: 10 mg/dL (ref 6–24)
Bilirubin Total: 0.5 mg/dL (ref 0.0–1.2)
CO2: 24 mmol/L (ref 20–29)
Calcium: 9.4 mg/dL (ref 8.7–10.2)
Chloride: 102 mmol/L (ref 96–106)
Creatinine, Ser: 0.7 mg/dL (ref 0.57–1.00)
Globulin, Total: 2.4 g/dL (ref 1.5–4.5)
Glucose: 94 mg/dL (ref 65–99)
Potassium: 4.2 mmol/L (ref 3.5–5.2)
Sodium: 138 mmol/L (ref 134–144)
Total Protein: 7 g/dL (ref 6.0–8.5)
eGFR: 111 mL/min/1.73

## 2021-05-07 LAB — LIPID PANEL
Chol/HDL Ratio: 3.1 ratio (ref 0.0–4.4)
Cholesterol, Total: 178 mg/dL (ref 100–199)
HDL: 57 mg/dL
LDL Chol Calc (NIH): 98 mg/dL (ref 0–99)
Triglycerides: 132 mg/dL (ref 0–149)
VLDL Cholesterol Cal: 23 mg/dL (ref 5–40)

## 2021-05-07 LAB — CYTOLOGY - PAP
Comment: NEGATIVE
Diagnosis: NEGATIVE
High risk HPV: NEGATIVE

## 2021-05-07 LAB — HEMOGLOBIN A1C
Est. average glucose Bld gHb Est-mCnc: 117 mg/dL
Hgb A1c MFr Bld: 5.7 % — ABNORMAL HIGH (ref 4.8–5.6)

## 2021-05-07 LAB — T4, FREE: Free T4: 1.37 ng/dL (ref 0.82–1.77)

## 2021-06-02 ENCOUNTER — Ambulatory Visit: Payer: Self-pay | Attending: Nurse Practitioner | Admitting: Nurse Practitioner

## 2021-06-02 ENCOUNTER — Ambulatory Visit: Payer: Self-pay | Admitting: *Deleted

## 2021-06-02 ENCOUNTER — Encounter: Payer: Self-pay | Admitting: Nurse Practitioner

## 2021-06-02 ENCOUNTER — Ambulatory Visit: Payer: Self-pay | Admitting: Nurse Practitioner

## 2021-06-02 ENCOUNTER — Other Ambulatory Visit: Payer: Self-pay

## 2021-06-02 ENCOUNTER — Ambulatory Visit: Payer: Self-pay | Admitting: Critical Care Medicine

## 2021-06-02 DIAGNOSIS — N946 Dysmenorrhea, unspecified: Secondary | ICD-10-CM

## 2021-06-02 MED ORDER — ACETAMINOPHEN-CODEINE #3 300-30 MG PO TABS
1.0000 | ORAL_TABLET | ORAL | 0 refills | Status: DC | PRN
  Filled 2021-06-02: qty 30, 3d supply, fill #0

## 2021-06-02 MED ORDER — ONDANSETRON 4 MG PO TBDP
4.0000 mg | ORAL_TABLET | Freq: Three times a day (TID) | ORAL | 0 refills | Status: AC | PRN
  Filled 2021-06-02: qty 42, 14d supply, fill #0

## 2021-06-02 NOTE — Progress Notes (Signed)
DUPLICATE ENCOUNTER

## 2021-06-02 NOTE — Progress Notes (Signed)
Virtual Visit via Telephone Note Due to national recommendations of social distancing due to [place] 19, telehealth visit is felt to be most appropriate for this patient at this time.  I discussed the limitations, risks, security and privacy concerns of performing an evaluation and management service by telephone and the availability of in person appointments. I also discussed with the patient that there may be a patient responsible charge related to this service. The patient expressed understanding and agreed to proceed.    I connected with Shelly Greene on 06/03/21  at  10:50 AM EDT  EDT by telephone and verified that I am speaking with the correct person using two identifiers.  Location of Patient: Private Residence   Location of Provider: [place] and [place] participating in Telemedicine visit: Geryl Rankins FNP-BC Shelly Greene    History of Present Illness: Telemedicine visit for: Dysmenorrhea   Dysmenorrhea Patient complains of menstrual symptoms. Symptoms began a few years ago. Patient describes symptoms of  dyspareunia (moderate), menstrual cramping (severe), and pelvic pain (severe). Symptoms occur with periods, which are usually 28 days apart and quite regular. Patient denies bloating/fluid retention, decreased libido, menorrhagia, and migraine headaches.  She was previously prescribed OCPs to help relieve symptoms however a few months ago she stopped taking her birth control pills. Since then her menstrual symptoms have returned and today she complains of moderate left sided pain and nausea. States the pain is 10/10. Pain is described as sharp. Last menstrual cycle was 5 days ago.    Past Medical History:  Diagnosis Date   Brain stem lesion    Being followed [facility] Neurosurgical [facility]   Gestational diabetes    History of gestational diabetes 11/08/2016   Early a1c and CMP negative. Normal 3rd trimester 2 hr GTT.    Hypothyroidism    PILAR CYST 02/27/2007   Qualifier: Diagnosis of  By: Walker Kehr MD, [place]     Ptosis 06/09/2016   Vitamin D deficiency     Past Surgical History:  Procedure Laterality Date   CESAREAN SECTION N/A 06/01/2017   Procedure: CESAREAN SECTION  Patient has Brain Stem Lesion, will require General anesthesia;  Surgeon: Lavonia Drafts, MD;  Location: [place];  Service: Obstetrics;  Laterality: N/A;   DILATION AND EVACUATION N/A 02/13/2015   Procedure: DILATATION AND EVACUATION;  Surgeon: Truett Mainland, DO;  Location: [place] [facility];  Service: Gynecology;  Laterality: N/A;   EYE SURGERY      Family History  Problem Relation Age of Onset   Prostate cancer Father     Social History   Socioeconomic History   Marital status: Married    Spouse name: Not on file   Number of children: Not on file   Years of education: Not on file   Highest education level: Not on file  Occupational History   Not on file  Tobacco Use   Smoking status: Never   Smokeless tobacco: Never  Substance and Sexual Activity   Alcohol use: No    Alcohol/week: 0.0 standard drinks   Drug use: No   Sexual activity: Yes    Birth control/protection: None  Other Topics Concern   Not on file  Social History Narrative   Not on file   Social Determinants of Health   Financial Resource Strain: Not on file  Food Insecurity: Not on file  Transportation Needs: Not on file  Physical Activity: Not on file  Stress: Not on file  Social Connections:  Not on file     Observations/Objective: Awake, alert and oriented x 3   Review of Systems  Constitutional:  Negative for fever, malaise/fatigue and weight loss.  HENT: Negative.  Negative for nosebleeds.   Eyes: Negative.  Negative for blurred vision, double vision and photophobia.  Respiratory: Negative.  Negative for cough and shortness of breath.   Cardiovascular: Negative.  Negative for chest pain, palpitations and leg swelling.  Gastrointestinal:  Negative.  Negative for heartburn, nausea and vomiting.  Genitourinary:        SEE HPI  Musculoskeletal: Negative.  Negative for myalgias.  Neurological: Negative.  Negative for dizziness, focal weakness, seizures and headaches.  Psychiatric/Behavioral: Negative.  Negative for suicidal ideas.    Assessment and Plan: Diagnoses and all orders for this visit:  Painful menstrual periods  Courtesy fill of tylenol 3. States ibuprofen 600-800 mg ineffective.  She will restart her birth control pills   Follow Up Instructions Return if symptoms worsen or fail to improve.     I discussed the assessment and treatment plan with the patient. The patient was provided an opportunity to ask questions and all were answered. The patient agreed with the plan and demonstrated an understanding of the instructions.   The patient was advised to call back or seek an in-person evaluation if the symptoms worsen or if the condition fails to improve as anticipated.  I provided 12 minutes of non-face-to-face time during this encounter including median intraservice time, reviewing previous notes, labs, imaging, medications and explaining diagnosis and management.  Gildardo Pounds, FNP-BC

## 2021-06-02 NOTE — Telephone Encounter (Signed)
Patient states she was given OCP to control her menstrual pain after her BTL-C-section for possible endometriosis pain. Patient states the treatment worked well until she stopped the OCP 2 months ago. Patient states the pain has returned and she states this cycle- the pain has been severe. Patient is requesting pain management- advised she may have to continue the OCP for pain control with cycle and treatment for endometriosis to suppress the hormones. Patient really wants something for pain. Appointment scheduled.

## 2021-06-02 NOTE — Telephone Encounter (Signed)
Reason for Disposition  [1] MODERATE pain (e.g., interferes with normal activities) AND [2] pain comes and goes (cramps) AND [3] present > 24 hours  (Exception: pain with Vomiting or Diarrhea - see that Guideline)  Answer Assessment - Initial Assessment Questions 1. LOCATION: "Where does it hurt?"      L side 2. RADIATION: "Does the pain shoot anywhere else?" (e.g., chest, back)     no 3. ONSET: "When did the pain begin?" (e.g., minutes, hours or days ago)      1 week ago with cycle 4. SUDDEN: "Gradual or sudden onset?"     gradual 5. PATTERN "Does the pain come and go, or is it constant?"    - If constant: "Is it getting better, staying the same, or worsening?"      (Note: Constant means the pain never goes away completely; most serious pain is constant and it progresses)     - If intermittent: "How long does it last?" "Do you have pain now?"     (Note: Intermittent means the pain goes away completely between bouts)     Comes and goes- only with cycle 6. SEVERITY: "How bad is the pain?"  (e.g., Scale 1-10; mild, moderate, or severe)   - MILD (1-3): doesn't interfere with normal activities, abdomen soft and not tender to touch    - MODERATE (4-7): interferes with normal activities or awakens from sleep, abdomen tender to touch    - SEVERE (8-10): excruciating pain, doubled over, unable to do any normal activities      severe 7. RECURRENT SYMPTOM: "Have you ever had this type of stomach pain before?" If Yes, ask: "When was the last time?" and "What happened that time?"      Yes- last month with cycle 8. CAUSE: "What do you think is causing the stomach pain?"     Possible endometriosis 9. RELIEVING/AGGRAVATING FACTORS: "What makes it better or worse?" (e.g., movement, antacids, bowel movement)     Pain medication 10. OTHER SYMPTOMS: "Do you have any other symptoms?" (e.g., back pain, diarrhea, fever, urination pain, vomiting)       no 11. PREGNANCY: "Is there any chance you are  pregnant?" "When was your last menstrual period?"       BTL  Protocols used: Abdominal Pain - [facility]

## 2021-06-03 ENCOUNTER — Encounter: Payer: Self-pay | Admitting: Nurse Practitioner

## 2021-06-30 ENCOUNTER — Telehealth: Payer: Self-pay | Admitting: Family Medicine

## 2021-06-30 NOTE — Telephone Encounter (Signed)
Copied from [place] [id number]. Topic: General - Other >> Jun 25, 2021  9:13 AM Shelly Greene wrote: Reason for CRM: Patient called in to inform Dr Margarita Rana that she is having issues with cramping and spotting when she is not on her menstrual cycle and want to go back to previous birth control pills. Say that she does not feel well when she take this medication.  Ph# [phone]

## 2021-06-30 NOTE — Telephone Encounter (Signed)
Please schedule patient and appointment for 810 or 130 to discuss birth control.

## 2021-07-20 ENCOUNTER — Ambulatory Visit: Payer: Self-pay

## 2021-08-31 ENCOUNTER — Ambulatory Visit: Payer: Self-pay | Attending: Family Medicine | Admitting: Family Medicine

## 2021-08-31 ENCOUNTER — Encounter: Payer: Self-pay | Admitting: Family Medicine

## 2021-08-31 ENCOUNTER — Other Ambulatory Visit: Payer: Self-pay

## 2021-08-31 VITALS — BP 97/60 | HR 64 | Ht 63.0 in | Wt 145.6 lb

## 2021-08-31 DIAGNOSIS — B351 Tinea unguium: Secondary | ICD-10-CM

## 2021-08-31 DIAGNOSIS — N946 Dysmenorrhea, unspecified: Secondary | ICD-10-CM

## 2021-08-31 DIAGNOSIS — E038 Other specified hypothyroidism: Secondary | ICD-10-CM

## 2021-08-31 MED ORDER — NORETHIN ACE-ETH ESTRAD-FE 1-20 MG-MCG PO TABS: 1.0000 | ORAL_TABLET | Freq: Every day | ORAL | 3 refills | Status: DC

## 2021-08-31 MED ORDER — TERBINAFINE HCL 250 MG PO TABS: 250.0000 mg | ORAL_TABLET | Freq: Every day | ORAL | 1 refills | Status: DC

## 2021-08-31 NOTE — Progress Notes (Signed)
Subjective:  Patient ID: Shelly Greene, female    DOB: April 21, 1981  Age: 40 y.o. MRN: [id number]  CC: Contraception   HPI Raynesha Tiedt is a 40 y.o. 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  Interval History: She noticed her dysmenorrhea was not as controlled with her most recent OCP (Loestrin) compared to what she previously received (Junel).  On checking with the different pharmacist she was informed Lenda Kelp was currently not being carried by the pharmacies but she would like a prescription to search around for her options as that is the only OCP she would rather take and she found it at one pharmacy but would cost about $30. Last cycles were in 05/2021 and 04/2021.  She is requesting prescription for an oral antifungal due to thickening and slightest yellowish discoloration of her toenails.  Tried OTC antifungals with no relief. Past Medical History:  Diagnosis Date   Brain stem lesion    Being followed [facility] Neurosurgical Dept   Gestational diabetes    History of gestational diabetes 11/08/2016   Early a1c and CMP negative. Normal 3rd trimester 2 hr GTT.   Hypothyroidism    PILAR CYST 02/27/2007   Qualifier: Diagnosis of  By: Walker Kehr MD, [place]     Ptosis 06/09/2016   Vitamin D deficiency     Past Surgical History:  Procedure Laterality Date   CESAREAN SECTION N/A 06/01/2017   Procedure: CESAREAN SECTION  Patient has Brain Stem Lesion, will require General anesthesia;  Surgeon: Lavonia Drafts, MD;  Location: [place];  Service: Obstetrics;  Laterality: N/A;   DILATION AND EVACUATION N/A 02/13/2015   Procedure: DILATATION AND EVACUATION;  Surgeon: Truett Mainland, DO;  Location: [facility];  Service: Gynecology;  Laterality: N/A;   EYE SURGERY      Family History  Problem Relation Age of Onset   Prostate cancer Father     No Known  Allergies  Outpatient Medications Prior to Visit  Medication Sig Dispense Refill   levothyroxine (SYNTHROID) 50 MCG tablet Take 1 tablet (50 mcg total) by mouth daily before breakfast. 60 tablet 5   Norethindrone Acetate-Ethinyl Estrad-FE (LOESTRIN 24 FE) 1-20 MG-MCG(24) tablet Take 1 tablet by mouth daily. 30 tablet 11   acetaminophen-codeine (TYLENOL #3) 300-30 MG tablet Take 1-2 tablets by mouth every 4 (four) hours as needed for moderate pain. (Patient not taking: Reported on 08/31/2021) 30 tablet 0   No facility-administered medications prior to visit.     ROS Review of Systems  Constitutional:  Negative for activity change, appetite change and fatigue.  HENT:  Negative for congestion, sinus pressure and sore throat.   Eyes:  Negative for visual disturbance.  Respiratory:  Negative for cough, chest tightness, shortness of breath and wheezing.   Cardiovascular:  Negative for chest pain and palpitations.  Gastrointestinal:  Negative for abdominal distention, abdominal pain and constipation.  Endocrine: Negative for polydipsia.  Genitourinary:  Positive for menstrual problem. Negative for dysuria and frequency.  Musculoskeletal:  Negative for arthralgias and back pain.  Skin:  Negative for rash.  Neurological:  Negative for tremors, light-headedness and numbness.  Hematological:  Does not bruise/bleed easily.  Psychiatric/Behavioral:  Negative for agitation and behavioral problems.    Objective:  BP 97/60   Pulse 64   Ht 5\' 3"  (1.6 m)   Wt 145 lb 9.6 oz (66 kg)   SpO2 100%   BMI  25.79 kg/m   BP/Weight 08/31/2021 0/97/3532 07/30/2425  Systolic BP 97 99 99  Diastolic BP 60 65 62  Wt. (Lbs) 145.6 161.8 163  BMI 25.79 28.66 28.87      Physical Exam Constitutional:      Appearance: She is well-developed.  Cardiovascular:     Rate and Rhythm: Normal rate.     Heart sounds: Normal heart sounds. No murmur heard. Pulmonary:     Effort: Pulmonary effort is normal.      Breath sounds: Normal breath sounds. No wheezing or rales.  Chest:     Chest wall: No tenderness.  Abdominal:     General: Bowel sounds are normal. There is no distension.     Palpations: Abdomen is soft. There is no mass.     Tenderness: There is no abdominal tenderness.  Musculoskeletal:        General: Normal range of motion.     Right lower leg: No edema.     Left lower leg: No edema.  Skin:    Comments: Slightly thickened left big toenail  Neurological:     Mental Status: She is alert and oriented to person, place, and time.  Psychiatric:        Mood and Affect: Mood normal.    CMP Latest Ref Rng & Units 05/05/2021 05/18/2019 09/27/2018  Glucose 65 - 99 mg/dL 94 101(H) 119(H)  BUN 6 - 24 mg/dL 10 6 10   Creatinine 0.57 - 1.00 mg/dL 0.70 0.60 0.70  Sodium 134 - 144 mmol/L 138 139 136  Potassium 3.5 - 5.2 mmol/L 4.2 4.2 3.4(L)  Chloride 96 - 106 mmol/L 102 104 99  CO2 20 - 29 mmol/L 24 26 22   Calcium 8.7 - 10.2 mg/dL 9.4 8.7(L) 9.4  Total Protein 6.0 - 8.5 g/dL 7.0 6.6 7.9  Total Bilirubin 0.0 - 1.2 mg/dL 0.5 0.4 1.0  Alkaline Phos 44 - 121 IU/L 63 51 78  AST 0 - 40 IU/L 24 31 32  ALT 0 - 32 IU/L 19 20 18     Lipid Panel     Component Value Date/Time   CHOL 178 05/05/2021 1108   TRIG 132 05/05/2021 1108   HDL 57 05/05/2021 1108   CHOLHDL 3.1 05/05/2021 1108   CHOLHDL 2.9 06/09/2016 1025   VLDL 23 06/09/2016 1025   LDLCALC 98 05/05/2021 1108    CBC    Component Value Date/Time   WBC 6.5 05/05/2021 1108   WBC 3.5 (L) 05/18/2019 1541   RBC 4.63 05/05/2021 1108   RBC 4.17 05/18/2019 1541   HGB 13.3 05/05/2021 1108   HCT 39.9 05/05/2021 1108   PLT 198 05/05/2021 1108   MCV 86 05/05/2021 1108   MCH 28.7 05/05/2021 1108   MCH 29.3 05/18/2019 1541   MCHC 33.3 05/05/2021 1108   MCHC 33.2 05/18/2019 1541   RDW 12.4 05/05/2021 1108   LYMPHSABS 1.9 05/05/2021 1108   MONOABS 0.3 05/18/2019 1541   EOSABS 0.1 05/05/2021 1108   BASOSABS 0.1 05/05/2021 1108    Lab  Results  Component Value Date   HGBA1C 5.7 (H) 05/05/2021    Lab Results  Component Value Date   TSH 1.820 05/05/2021    Assessment & Plan:  1. Other specified hypothyroidism Controlled We will check thyroid panel and adjust regimen accordingly - T4, free - TSH  2. Onychomycosis LFTs are normal - terbinafine (LAMISIL) 250 MG tablet; Take 1 tablet (250 mg total) by mouth daily.  Dispense: 30 tablet; Refill: 1  3. Dysmenorrhea Uncontrolled on Loestrin Will write prescription for Junel Advised that if symptoms persist she may need further evaluation for endometriosis - norethindrone-ethinyl estradiol-FE (JUNEL FE 1/20) 1-20 MG-MCG tablet; Take 1 tablet by mouth daily.  Dispense: 90 tablet; Refill: 3   Meds ordered this encounter  Medications   norethindrone-ethinyl estradiol-FE (JUNEL FE 1/20) 1-20 MG-MCG tablet    Sig: Take 1 tablet by mouth daily.    Dispense:  90 tablet    Refill:  3   terbinafine (LAMISIL) 250 MG tablet    Sig: Take 1 tablet (250 mg total) by mouth daily.    Dispense:  30 tablet    Refill:  1    Follow-up: Return in about 6 months (around 03/01/2022) for Medical conditions.       Charlott Rakes, MD, FAAFP. [facility] and [facility], [place]   08/31/2021, 12:29 PM

## 2021-08-31 NOTE — Progress Notes (Signed)
Discuss current birth control.

## 2021-09-01 ENCOUNTER — Other Ambulatory Visit: Payer: Self-pay | Admitting: Family Medicine

## 2021-09-01 DIAGNOSIS — E038 Other specified hypothyroidism: Secondary | ICD-10-CM

## 2021-09-01 LAB — TSH: TSH: 2.15 u[IU]/mL (ref 0.450–4.500)

## 2021-09-01 LAB — T4, FREE: Free T4: 1.37 ng/dL (ref 0.82–1.77)

## 2021-09-01 MED ORDER — LEVOTHYROXINE SODIUM 50 MCG PO TABS: 50.0000 ug | ORAL_TABLET | Freq: Every day | ORAL | 5 refills | Status: DC

## 2021-09-07 ENCOUNTER — Other Ambulatory Visit: Payer: Self-pay | Admitting: Family Medicine

## 2021-09-07 DIAGNOSIS — N946 Dysmenorrhea, unspecified: Secondary | ICD-10-CM

## 2021-09-07 MED ORDER — NORETHIN ACE-ETH ESTRAD-FE 1-20 MG-MCG PO TABS: 1.0000 | ORAL_TABLET | Freq: Every day | ORAL | 3 refills | Status: DC

## 2021-09-07 NOTE — Telephone Encounter (Signed)
Copied from [place] [id number]. Topic: Quick Communication - Rx Refill/Question >> Sep 07, 2021 12:37 PM Leward Quan A wrote: Medication: norethindrone-ethinyl estradiol-FE (JUNEL FE 1/20) 1-20 MG-MCG tablet   Has the patient contacted their pharmacy? Yes. Pharmacy called say patient called them (Agent: If no, request that the patient contact the pharmacy for the refill.) (Agent: If yes, when and what did the pharmacy advise?)  Preferred Pharmacy (with phone number or street name): [place], [place]  Phone:  [phone] Fax:  [phone]    Has the patient been seen for an appointment in the last year OR does the patient have an upcoming appointment? Yes.    Agent: Please be advised that RX refills may take up to 3 business days. We ask that you follow-up with your pharmacy.

## 2021-09-07 NOTE — Telephone Encounter (Signed)
Requested Prescriptions  Pending Prescriptions Disp Refills  . norethindrone-ethinyl estradiol-FE (JUNEL FE 1/20) 1-20 MG-MCG tablet 90 tablet 3    Sig: Take 1 tablet by mouth daily.     OB/GYN:  Contraceptives Passed - 09/07/2021  4:13 PM      Passed - Last BP in normal range    BP Readings from Last 1 Encounters:  08/31/21 97/60         Passed - Valid encounter within last 12 months    Recent Outpatient Visits          1 week ago Onychomycosis   [place], Charlane Ferretti, MD   3 months ago Painful menstrual periods   [place], Vernia Buff, NP   4 months ago Annual physical exam   [place], Enobong, MD   9 months ago Nasal congestion   [place] Kerin Perna, NP   1 year ago Dysuria   [facility] Antony Blackbird, MD

## 2022-01-11 ENCOUNTER — Ambulatory Visit (HOSPITAL_COMMUNITY)
Admission: RE | Admit: 2022-01-11 | Discharge: 2022-01-11 | Disposition: A | Discharge status: Home / Self Care | Payer: Self-pay | Source: Ambulatory Visit | Attending: Family Medicine | Admitting: Family Medicine

## 2022-01-11 ENCOUNTER — Encounter: Payer: Self-pay | Admitting: Family Medicine

## 2022-01-11 ENCOUNTER — Ambulatory Visit: Payer: Self-pay | Attending: Family Medicine | Admitting: Family Medicine

## 2022-01-11 ENCOUNTER — Other Ambulatory Visit: Payer: Self-pay

## 2022-01-11 ENCOUNTER — Ambulatory Visit: Payer: Self-pay

## 2022-01-11 VITALS — BP 120/80 | HR 75 | Ht 63.0 in | Wt 143.4 lb

## 2022-01-11 DIAGNOSIS — M7541 Impingement syndrome of right shoulder: Secondary | ICD-10-CM

## 2022-01-11 MED ORDER — CYCLOBENZAPRINE HCL 10 MG PO TABS: 10.0000 mg | ORAL_TABLET | Freq: Two times a day (BID) | ORAL | 1 refills | Status: DC | PRN

## 2022-01-11 MED ORDER — MELOXICAM 7.5 MG PO TABS: 7.5000 mg | ORAL_TABLET | Freq: Every day | ORAL | 1 refills | Status: DC

## 2022-01-11 NOTE — Patient Instructions (Signed)
Shoulder Impingement Syndrome Shoulder impingement syndrome is a condition that causes pain when connective tissues (tendons) surrounding the shoulder joint become pinched. These tendons are part of the group of muscles and tissues that help to stabilize the shoulder (rotator cuff). Beneath the rotator cuff is a fluid-filled sac (bursa) that allows the muscles and tendons to glide smoothly. The bursa may become swollen or irritated (bursitis). Bursitis, swelling in the rotator cuff tendons, or both conditions can decrease how much space is under a bone in the shoulder joint (acromion), resulting in impingement. What are the causes? Shoulder impingement syndrome may be caused by bursitis or swelling of the rotator cuff tendons, which may result from: Repetitive overhead arm movements. Falling onto the shoulder. Weakness in the shoulder muscles. What increases the risk? You may be more likely to develop this condition if you: Play sports that involve throwing, such as baseball. Participate in sports such as tennis, volleyball, and swimming. Work as a Curator, Games developer, or Architect. Some people are also more likely to develop impingement syndrome because of the shape of their acromion bone. What are the signs or symptoms? The main symptom of this condition is pain on the front or side of the shoulder. The pain may: Get worse when lifting or raising the arm. Get worse at night. Wake you up from sleeping. Feel sharp when the shoulder is moved and then fade to an ache. Other symptoms may include: Tenderness. Stiffness. Inability to raise the arm above shoulder level or behind the body. Weakness. How is this diagnosed? This condition may be diagnosed based on: Your symptoms and medical history. A physical exam. Imaging tests, such as: X-rays. MRI. Ultrasound. How is this treated? This condition may be treated by: Resting your shoulder and avoiding all activities that cause pain or  put stress on the shoulder. Icing your shoulder. NSAIDs to help reduce pain and swelling. One or more injections of medicines to numb the area and reduce inflammation. Physical therapy. Surgery. This may be needed if nonsurgical treatments have not helped. Surgery may involve repairing the rotator cuff, reshaping the acromion, or removing the bursa. Follow these instructions at home: Managing pain, stiffness, and swelling  If directed, put ice on the injured area. Put ice in a plastic bag. Place a towel between your skin and the bag. Leave the ice on for 20 minutes, 2-3 times a day. Activity Rest and return to your normal activities as told by your health care provider. Ask your health care provider what activities are safe for you. Do exercises as told by your health care provider. General instructions Do not use any products that contain nicotine or tobacco, such as cigarettes, e-cigarettes, and chewing tobacco. These can delay healing. If you need help quitting, ask your health care provider. Ask your health care provider when it is safe for you to drive. Take over-the-counter and prescription medicines only as told by your health care provider. Keep all follow-up visits as told by your health care provider. This is important. How is this prevented? Give your body time to rest between periods of activity. Be safe and responsible while being active. This will help you avoid falls. Maintain physical fitness, including strength and flexibility. Contact a health care provider if: Your symptoms have not improved after 1-2 months of treatment and rest. You cannot lift your arm away from your body. Summary Shoulder impingement syndrome is a condition that causes pain when connective tissues (tendons) surrounding the shoulder joint become pinched. The  main symptom of this condition is pain on the front or side of the shoulder. This condition is usually treated with rest, ice, and pain  medicines as needed. This information is not intended to replace advice given to you by your health care provider. Make sure you discuss any questions you have with your health care provider. Document Revised: 03/01/2019 Document Reviewed: 05/02/2018 Elsevier Patient Education  2022 [employer].

## 2022-01-11 NOTE — Progress Notes (Signed)
Subjective:  Patient ID: Shelly Greene, female    DOB: 28-Mar-1981  Age: 41 y.o. MRN: [id number]  CC: Arm Pain   HPI Shelly Greene is a 41 y.o. 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  Interval History: Her R arm has been hurting x2 months and it started with a cracking sound. The last 3 days symptoms worsened despite using using Ibuprofen. ROM has been difficult and she has numbness in her forearm and flexion and internal rotation provides relief hence she has been using a scarf as a sling. 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 She has been going to to the Gym and doing a lot of weight lifting and endorses repetitive abduction movements while lifting weights. Pain is an 8/10; she has no neck pain but has a pulsating sensation in her R  She is R hand dominant. Past Medical History:  Diagnosis Date   Brain stem lesion    Being followed [facility] Neurosurgical Dept   Gestational diabetes    History of gestational diabetes 11/08/2016   Early a1c and CMP negative. Normal 3rd trimester 2 hr GTT.   Hypothyroidism    PILAR CYST 02/27/2007   Qualifier: Diagnosis of  By: Walker Kehr MD, [place]     Ptosis 06/09/2016   Vitamin D deficiency     Past Surgical History:  Procedure Laterality Date   CESAREAN SECTION N/A 06/01/2017   Procedure: CESAREAN SECTION  Patient has Brain Stem Lesion, will require General anesthesia;  Surgeon: Lavonia Drafts, MD;  Location: [place];  Service: Obstetrics;  Laterality: N/A;   DILATION AND EVACUATION N/A 02/13/2015   Procedure: DILATATION AND EVACUATION;  Surgeon: Truett Mainland, DO;  Location: [facility];  Service: Gynecology;  Laterality: N/A;   EYE SURGERY      Family History  Problem Relation Age of Onset   Prostate cancer Father     No Known Allergies  Outpatient Medications Prior to Visit   Medication Sig Dispense Refill   acetaminophen-codeine (TYLENOL #3) 300-30 MG tablet Take 1-2 tablets by mouth every 4 (four) hours as needed for moderate pain. 30 tablet 0   levothyroxine (SYNTHROID) 50 MCG tablet Take 1 tablet (50 mcg total) by mouth daily before breakfast. 60 tablet 5   norethindrone-ethinyl estradiol-FE (JUNEL FE 1/20) 1-20 MG-MCG tablet Take 1 tablet by mouth daily. 90 tablet 3   terbinafine (LAMISIL) 250 MG tablet Take 1 tablet (250 mg total) by mouth daily. (Patient not taking: Reported on 01/11/2022) 30 tablet 1   No facility-administered medications prior to visit.     ROS Review of Systems  Constitutional:  Negative for activity change, appetite change and fatigue.  HENT:  Negative for congestion, sinus pressure and sore throat.   Eyes:  Negative for visual disturbance.  Respiratory:  Negative for cough, chest tightness, shortness of breath and wheezing.   Cardiovascular:  Negative for chest pain and palpitations.  Gastrointestinal:  Negative for abdominal distention, abdominal pain and constipation.  Endocrine: Negative for polydipsia.  Genitourinary:  Negative for dysuria and frequency.  Musculoskeletal:        See HPI  Skin:  Negative for rash.  Neurological:  Negative for tremors, light-headedness and numbness.  Hematological:  Does not bruise/bleed easily.  Psychiatric/Behavioral:  Negative for agitation and behavioral problems.    Objective:  BP 120/80    Pulse 75  Ht 5\' 3"  (1.6 m)    Wt 143 lb 6.4 oz (65 kg)    SpO2 97%    BMI 25.40 kg/m   BP/Weight 01/11/2022 08/31/2021 2/77/4128  Systolic BP 786 97 99  Diastolic BP 80 60 65  Wt. (Lbs) 143.4 145.6 161.8  BMI 25.4 25.79 28.66      Physical Exam Constitutional:      Appearance: She is well-developed.  Cardiovascular:     Rate and Rhythm: Normal rate.     Heart sounds: Normal heart sounds. No murmur heard. Pulmonary:     Effort: Pulmonary effort is normal.     Breath sounds: Normal  breath sounds. No wheezing or rales.  Chest:     Chest wall: No tenderness.  Abdominal:     General: Bowel sounds are normal. There is no distension.     Palpations: Abdomen is soft. There is no mass.     Tenderness: There is no abdominal tenderness.  Musculoskeletal:     Cervical back: Tenderness (slight TTP of R side of neck) present.     Right lower leg: No edema.     Left lower leg: No edema.     Comments: Tenderness on abduction, external rotation, forward elevation of right arm Normal handgrip bilaterally. Positive Neer's sign, negative Hawkin  Neurological:     Mental Status: She is alert and oriented to person, place, and time.  Psychiatric:        Mood and Affect: Mood normal.    CMP Latest Ref Rng & Units 05/05/2021 05/18/2019 09/27/2018  Glucose 65 - 99 mg/dL 94 101(H) 119(H)  BUN 6 - 24 mg/dL 10 6 10   Creatinine 0.57 - 1.00 mg/dL 0.70 0.60 0.70  Sodium 134 - 144 mmol/L 138 139 136  Potassium 3.5 - 5.2 mmol/L 4.2 4.2 3.4(L)  Chloride 96 - 106 mmol/L 102 104 99  CO2 20 - 29 mmol/L 24 26 22   Calcium 8.7 - 10.2 mg/dL 9.4 8.7(L) 9.4  Total Protein 6.0 - 8.5 g/dL 7.0 6.6 7.9  Total Bilirubin 0.0 - 1.2 mg/dL 0.5 0.4 1.0  Alkaline Phos 44 - 121 IU/L 63 51 78  AST 0 - 40 IU/L 24 31 32  ALT 0 - 32 IU/L 19 20 18     Lipid Panel     Component Value Date/Time   CHOL 178 05/05/2021 1108   TRIG 132 05/05/2021 1108   HDL 57 05/05/2021 1108   CHOLHDL 3.1 05/05/2021 1108   CHOLHDL 2.9 06/09/2016 1025   VLDL 23 06/09/2016 1025   LDLCALC 98 05/05/2021 1108    CBC    Component Value Date/Time   WBC 6.5 05/05/2021 1108   WBC 3.5 (L) 05/18/2019 1541   RBC 4.63 05/05/2021 1108   RBC 4.17 05/18/2019 1541   HGB 13.3 05/05/2021 1108   HCT 39.9 05/05/2021 1108   PLT 198 05/05/2021 1108   MCV 86 05/05/2021 1108   MCH 28.7 05/05/2021 1108   MCH 29.3 05/18/2019 1541   MCHC 33.3 05/05/2021 1108   MCHC 33.2 05/18/2019 1541   RDW 12.4 05/05/2021 1108   LYMPHSABS 1.9 05/05/2021  1108   MONOABS 0.3 05/18/2019 1541   EOSABS 0.1 05/05/2021 1108   BASOSABS 0.1 05/05/2021 1108    Lab Results  Component Value Date   HGBA1C 5.7 (H) 05/05/2021    Assessment & Plan:  1. Impingement syndrome of right shoulder Educated about pathophysiology of this condition Counseled on the need for physical therapy but the patient  declines this. She would like to have an x-ray done even though I have explained to her that standard of care is to proceed with medication and physical therapy first.  She is also inquiring about an MRI.  I will leave the decision for MRI at the discretion of the orthopedics once she has a visit with them. Due to persistence and the fact that she declined PT I have referred shoulder x-ray and the plan will be to refer to orthopedics once x-ray result is obtained.  I have explained to her that it is very likely that x-ray might be unrevealing. - DG Shoulder Right; Future - meloxicam (MOBIC) 7.5 MG tablet; Take 1 tablet (7.5 mg total) by mouth daily.  Dispense: 30 tablet; Refill: 1 - cyclobenzaprine (FLEXERIL) 10 MG tablet; Take 1 tablet (10 mg total) by mouth 2 (two) times daily as needed for muscle spasms.  Dispense: 30 tablet; Refill: 1   Meds ordered this encounter  Medications   meloxicam (MOBIC) 7.5 MG tablet    Sig: Take 1 tablet (7.5 mg total) by mouth daily.    Dispense:  30 tablet    Refill:  1   cyclobenzaprine (FLEXERIL) 10 MG tablet    Sig: Take 1 tablet (10 mg total) by mouth 2 (two) times daily as needed for muscle spasms.    Dispense:  30 tablet    Refill:  1    Follow-up: Return if symptoms worsen or fail to improve.       Charlott Rakes, MD, FAAFP. [facility] and [place], [place]   01/11/2022, 9:49 AM

## 2022-01-11 NOTE — Progress Notes (Signed)
Pain in right arm  

## 2022-01-11 NOTE — Telephone Encounter (Signed)
°  Appointment found for this morning.    Chief Complaint: arm pain more than 2 weeks Symptoms: cannot move arm - very painful Frequency: constant Pertinent Negatives: Patient denies Fever, redness swelling Disposition: [] ED /[] Urgent Care (no appt availability in office) / [] Appointment(In office/virtual)/ []  [place] [facility]/ [] Home Care/ [] Refused Recommended Disposition /[] [facility]/ [x]  Follow-up with PCP Additional Notes: Appointment available this morning. Pt took tylenol and Rx with Tylenol in medication last night.   Reason for Disposition  [1] MODERATE pain (e.g., interferes with normal activities) AND [2] present > 3 days  Answer Assessment - Initial Assessment Questions 1. ONSET: "When did the pain start?"     2 months ago - very mild 2. LOCATION: "Where is the pain located?"     Right arm shoulder 3. PAIN: "How bad is the pain?" (Scale 1-10; or mild, moderate, severe)   - MILD (1-3): doesn't interfere with normal activities   - MODERATE (4-7): interferes with normal activities (e.g., work or school) or awakens from sleep   - SEVERE (8-10): excruciating pain, unable to do any normal activities, unable to hold a cup of water     9 4. WORK OR EXERCISE: "Has there been any recent work or exercise that involved this part of the body?"     yes 5. CAUSE: "What do you think is causing the arm pain?"     unsure 6. OTHER SYMPTOMS: "Do you have any other symptoms?" (e.g., neck pain, swelling, rash, fever, numbness, weakness)     no 7. PREGNANCY: "Is there any chance you are pregnant?" "When was your last menstrual period?"     na  Protocols used: Arm Pain-A-AH

## 2022-01-12 ENCOUNTER — Other Ambulatory Visit: Payer: Self-pay | Admitting: Family Medicine

## 2022-01-12 ENCOUNTER — Telehealth: Payer: Self-pay

## 2022-01-12 DIAGNOSIS — M7541 Impingement syndrome of right shoulder: Secondary | ICD-10-CM

## 2022-01-12 NOTE — Telephone Encounter (Signed)
Routing to PCP for review.

## 2022-01-12 NOTE — Telephone Encounter (Signed)
Letter has been sent to her via [place]

## 2022-01-12 NOTE — Telephone Encounter (Signed)
Copied from [place] [id number]. Topic: General - Other >> Jan 12, 2022 10:28 AM Bayard Beaver wrote: Reason for CRM: pt called in asking for note to be sent to [facility] where she goes showing that she can't do the excercises there.

## 2022-01-13 ENCOUNTER — Encounter: Payer: Self-pay | Admitting: Family Medicine

## 2022-01-26 ENCOUNTER — Ambulatory Visit: Payer: Self-pay | Admitting: Orthopedic Surgery

## 2022-03-31 ENCOUNTER — Other Ambulatory Visit: Payer: Self-pay

## 2022-04-05 ENCOUNTER — Other Ambulatory Visit: Payer: Self-pay | Admitting: Family Medicine

## 2022-04-05 DIAGNOSIS — Z1231 Encounter for screening mammogram for malignant neoplasm of breast: Secondary | ICD-10-CM

## 2022-05-02 ENCOUNTER — Inpatient Hospital Stay: Admission: RE | Admit: 2022-05-02 | Payer: Self-pay | Source: Ambulatory Visit

## 2022-08-22 ENCOUNTER — Other Ambulatory Visit: Payer: Self-pay | Admitting: Family Medicine

## 2022-08-22 DIAGNOSIS — N946 Dysmenorrhea, unspecified: Secondary | ICD-10-CM

## 2022-08-22 NOTE — Telephone Encounter (Signed)
Medication Refill - Medication: norethindrone-ethinyl estradiol-FE (JUNEL FE 1/20) 1-20 MG-MCG tablet [[id number]]   Has the patient contacted their pharmacy? Yes.  Pharmacy is calling  (Agent: If no, request that the patient contact the pharmacy for the refill. If patient does not wish to contact the pharmacy document the reason why and proceed with request.) (Agent: If yes, when and what did the pharmacy advise?)  Preferred Pharmacy (with phone number or street name):  [facility], [place] [address]  Phone: [phone] Fax: [phone]  Hours: Open 24 hours   Has the patient been seen for an appointment in the last year OR does the patient have an upcoming appointment? Yes.  08/31/21  Agent: Please be advised that RX refills may take up to 3 business days. We ask that you follow-up with your pharmacy.

## 2022-08-23 MED ORDER — NORETHIN ACE-ETH ESTRAD-FE 1-20 MG-MCG PO TABS: 1.0000 | ORAL_TABLET | Freq: Every day | ORAL | 1 refills | Status: DC

## 2022-08-23 NOTE — Telephone Encounter (Signed)
Patient will need an office visit for further refills. Requested Prescriptions  Pending Prescriptions Disp Refills  . norethindrone-ethinyl estradiol-FE (JUNEL FE 1/20) 1-20 MG-MCG tablet 90 tablet 1    Sig: Take 1 tablet by mouth daily.     OB/GYN:  Contraceptives Passed - 08/22/2022  1:46 PM      Passed - Last BP in normal range    BP Readings from Last 1 Encounters:  01/11/22 120/80         Passed - Valid encounter within last 12 months    Recent Outpatient Visits          7 months ago Impingement syndrome of right shoulder   [place], Enobong, MD   11 months ago Onychomycosis   [place], Enobong, MD   1 year ago Painful menstrual periods   [place], Zelda W, NP   1 year ago Annual physical exam   [place], MD   1 year ago Nasal congestion   [place] Kerin Perna, NP             Passed - Patient is not a smoker

## 2022-10-11 ENCOUNTER — Other Ambulatory Visit: Payer: Self-pay | Admitting: Family Medicine

## 2022-10-11 DIAGNOSIS — E038 Other specified hypothyroidism: Secondary | ICD-10-CM

## 2022-10-11 NOTE — Telephone Encounter (Signed)
Requested Prescriptions  Pending Prescriptions Disp Refills   levothyroxine (SYNTHROID) 50 MCG tablet [Pharmacy Med Name: levothyroxine 50 mcg tablet] 90 tablet 0    Sig: Take 1 tablet (50 mcg total) by mouth daily before breakfast.     Endocrinology:  Hypothyroid Agents Failed - 10/11/2022  2:27 PM      Failed - TSH in normal range and within 360 days    TSH  Date Value Ref Range Status  08/31/2021 2.150 0.450 - 4.500 uIU/mL Final         Passed - Valid encounter within last 12 months    Recent Outpatient Visits           9 months ago Impingement syndrome of right shoulder   [place] Charlott Rakes, MD   1 year ago Onychomycosis   [place], Enobong, MD   1 year ago Painful menstrual periods   [place], Vernia Buff, NP   1 year ago Annual physical exam   [place], MD   1 year ago Nasal congestion   [place] Kerin Perna, NP

## 2023-03-28 ENCOUNTER — Ambulatory Visit: Payer: Self-pay

## 2023-04-18 ENCOUNTER — Ambulatory Visit: Payer: Self-pay

## 2023-05-16 ENCOUNTER — Ambulatory Visit: Payer: Self-pay

## 2023-06-07 ENCOUNTER — Ambulatory Visit: Payer: Self-pay

## 2023-06-28 ENCOUNTER — Encounter: Payer: Self-pay | Admitting: Family Medicine

## 2023-10-17 ENCOUNTER — Other Ambulatory Visit: Payer: Self-pay | Admitting: Family Medicine

## 2023-10-17 DIAGNOSIS — N946 Dysmenorrhea, unspecified: Secondary | ICD-10-CM

## 2023-10-23 ENCOUNTER — Telehealth: Payer: Self-pay

## 2023-10-23 ENCOUNTER — Other Ambulatory Visit: Payer: Self-pay

## 2023-10-23 NOTE — Telephone Encounter (Signed)
Routing to PCP for review.

## 2023-10-23 NOTE — Telephone Encounter (Signed)
First available appointment is in January can patient do a virtual visit.

## 2023-10-23 NOTE — Telephone Encounter (Signed)
Yes

## 2023-10-23 NOTE — Telephone Encounter (Signed)
Last visit  was 22 months ago. She needs an OV

## 2023-10-23 NOTE — Telephone Encounter (Signed)
Patent came in requesting for [place] to send a refill to her [facility] pharmacy for her birth control Norethindrone-Ethinyl Estradiol- Ferrous Fumarate. Expressed to patient that she may need a office visit for further refills. Patient denied appointment.

## 2023-10-25 NOTE — Telephone Encounter (Signed)
Patient has been called and scheduled a mychart video visit

## 2023-10-26 ENCOUNTER — Encounter: Payer: Self-pay | Admitting: Family Medicine

## 2023-10-26 ENCOUNTER — Telehealth (HOSPITAL_BASED_OUTPATIENT_CLINIC_OR_DEPARTMENT_OTHER): Payer: Self-pay | Admitting: Family Medicine

## 2023-10-26 DIAGNOSIS — Z91148 Patient's other noncompliance with medication regimen for other reason: Secondary | ICD-10-CM

## 2023-10-26 DIAGNOSIS — E038 Other specified hypothyroidism: Secondary | ICD-10-CM

## 2023-10-26 DIAGNOSIS — N946 Dysmenorrhea, unspecified: Secondary | ICD-10-CM

## 2023-10-26 MED ORDER — NORETHIN ACE-ETH ESTRAD-FE 1-20 MG-MCG PO TABS: 1.0000 | ORAL_TABLET | Freq: Every day | ORAL | 3 refills | Status: DC

## 2023-10-26 NOTE — Progress Notes (Signed)
Virtual Visit via Telephone Note  I connected with Shelly Greene, on 10/26/2023 at 8:31 AM by telephone and verified that I am speaking with the correct person using two identifiers.   Consent: I discussed the limitations, risks, security and privacy concerns of performing an evaluation and management service by telephone and the availability of in person appointments. I also discussed with the patient that there may be a patient responsible charge related to this service. The patient expressed understanding and agreed to proceed.   Location of Patient: Home  Location of Provider: Clinic   Persons participating in Telemedicine visit: Shelly Greene Interpreter Dr. Alvis Lemmings     History of Present Illness: Shelly Greene is a 42 y.o. 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    Discussed the use of AI scribe software for clinical note transcription with the patient, who gave verbal consent to proceed.  She  presents with a request for a refill of her birth control medication. She reports using the birth control pills solely for the management of severe menstrual pain, not for contraception, as she has previously undergone surgery to prevent pregnancy. She has been feeling well overall and has been exercising regularly for the past two years. She has not seen a doctor in the last 22 months.  Three months ago, she decided to stop taking her thyroid medication, intending to have her thyroid levels checked again after a period of not taking the medication. She acknowledges that this decision may not have been medically advisable. She plans to schedule an appointment in January to assess her thyroid status.      Past Medical History:  Diagnosis Date   Brain stem lesion    Being followed [facility] Neurosurgical Dept   Gestational diabetes    History  of gestational diabetes 11/08/2016   Early a1c and CMP negative. Normal 3rd trimester 2 hr GTT.   Hypothyroidism    PILAR CYST 02/27/2007   Qualifier: Diagnosis of  By: Sheffield Slider MD, Wayne     Ptosis 06/09/2016   Vitamin D deficiency    No Known Allergies  Current Outpatient Medications on File Prior to Visit  Medication Sig Dispense Refill   acetaminophen-codeine (TYLENOL #3) 300-30 MG tablet Take 1-2 tablets by mouth every 4 (four) hours as needed for moderate pain. 30 tablet 0   cyclobenzaprine (FLEXERIL) 10 MG tablet Take 1 tablet (10 mg total) by mouth 2 (two) times daily as needed for muscle spasms. 30 tablet 1   levothyroxine (SYNTHROID) 50 MCG tablet Take 1 tablet (50 mcg total) by mouth daily before breakfast. 90 tablet 0   meloxicam (MOBIC) 7.5 MG tablet Take 1 tablet (7.5 mg total) by mouth daily. 30 tablet 1   terbinafine (LAMISIL) 250 MG tablet Take 1 tablet (250 mg total) by mouth daily. (Patient not taking: Reported on 01/11/2022) 30 tablet 1   No current facility-administered medications on file prior to visit.    ROS: See HPI  Observations/Objective: Awake, alert, oriented x3 Not in acute distress Normal mood      Latest Ref Rng & Units 05/05/2021   11:08 AM 05/18/2019    3:41 PM 09/27/2018    4:55 PM  CMP  Glucose 65 - 99 mg/dL 94  161  096   BUN 6 - 24 mg/dL 10  6  10    Creatinine 0.57 - 1.00 mg/dL 0.45  4.09  0.70   Sodium 134 - 144 mmol/L 138  139  136   Potassium 3.5 - 5.2 mmol/L 4.2  4.2  3.4   Chloride 96 - 106 mmol/L 102  104  99   CO2 20 - 29 mmol/L 24  26  22    Calcium 8.7 - 10.2 mg/dL 9.4  8.7  9.4   Total Protein 6.0 - 8.5 g/dL 7.0  6.6  7.9   Total Bilirubin 0.0 - 1.2 mg/dL 0.5  0.4  1.0   Alkaline Phos 44 - 121 IU/L 63  51  78   AST 0 - 40 IU/L 24  31  32   ALT 0 - 32 IU/L 19  20  18      Lipid Panel     Component Value Date/Time   CHOL 178 05/05/2021 1108   TRIG 132 05/05/2021 1108   HDL 57 05/05/2021 1108   CHOLHDL 3.1 05/05/2021 1108    CHOLHDL 2.9 06/09/2016 1025   VLDL 23 06/09/2016 1025   LDLCALC 98 05/05/2021 1108   LABVLDL 23 05/05/2021 1108    Lab Results  Component Value Date   HGBA1C 5.7 (H) 05/05/2021    Assessment and Plan: 1. Dysmenorrhea - norethindrone-ethinyl estradiol-FE (JUNEL FE 1/20) 1-20 MG-MCG tablet; Take 1 tablet by mouth daily.  Dispense: 90 tablet; Refill: 3  2. Other specified hypothyroidism Non adherent with Levothyroxine She would like to check labs prior to retstarting  3. Nonadherence to medication Emphasized the need to be adherent   Follow Up Instructions: Keep upcoming appointment    I discussed the assessment and treatment plan with the patient. The patient was provided an opportunity to ask questions and all were answered. The patient agreed with the plan and demonstrated an understanding of the instructions.   The patient was advised to call back or seek an in-person evaluation if the symptoms worsen or if the condition fails to improve as anticipated.     I provided 12 minutes total of non-face-to-face time during this encounter.   Hoy Register, MD, FAAFP. [facility] [place], [place] [phone]   10/26/2023, 8:31 AM

## 2023-11-10 ENCOUNTER — Ambulatory Visit (INDEPENDENT_AMBULATORY_CARE_PROVIDER_SITE_OTHER): Payer: Self-pay

## 2023-11-10 DIAGNOSIS — Z538 Procedure and treatment not carried out for other reasons: Secondary | ICD-10-CM

## 2024-03-13 ENCOUNTER — Encounter: Payer: Self-pay | Admitting: Family Medicine

## 2024-03-13 ENCOUNTER — Ambulatory Visit: Payer: Self-pay | Attending: Family Medicine | Admitting: Family Medicine

## 2024-03-13 VITALS — BP 121/72 | HR 61 | Ht 63.0 in | Wt 156.6 lb

## 2024-03-13 DIAGNOSIS — R7303 Prediabetes: Secondary | ICD-10-CM

## 2024-03-13 DIAGNOSIS — E038 Other specified hypothyroidism: Secondary | ICD-10-CM

## 2024-03-13 DIAGNOSIS — Z13228 Encounter for screening for other metabolic disorders: Secondary | ICD-10-CM

## 2024-03-13 DIAGNOSIS — N946 Dysmenorrhea, unspecified: Secondary | ICD-10-CM

## 2024-03-13 DIAGNOSIS — G939 Disorder of brain, unspecified: Secondary | ICD-10-CM

## 2024-03-13 DIAGNOSIS — Z0001 Encounter for general adult medical examination with abnormal findings: Secondary | ICD-10-CM

## 2024-03-13 MED ORDER — NORETHIN ACE-ETH ESTRAD-FE 1-20 MG-MCG PO TABS: 1.0000 | ORAL_TABLET | Freq: Every day | ORAL | 3 refills | Status: DC

## 2024-03-13 NOTE — Progress Notes (Signed)
 Subjective:  Patient ID: Shelly Greene, female    DOB: 1981/07/09  Age: 43 y.o. MRN: [id number]  CC: Medical Management of Chronic Issues     Discussed the use of AI scribe software for clinical note transcription with the patient, who gave verbal consent to proceed.  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  She reports feeling generally well and has been managing her prediabetes with regular exercise, attending the gym at least three times a week. She expresses concern about her blood sugar levels, occasionally feeling deeply sleepy, which she attributes to potential high blood sugar. She also mentions occasional dizziness, which she believes is related to her brain lesion. The patient reports a significant improvement in her balance and overall health since starting regular exercise. She also mentions a brief period of feeling lightheaded upon waking, which lasted for about a week. The patient stopped taking thyroid medication last year due to uncertainty about her diagnosis and wishes to have her thyroid levels checked. She also requests a refill for her birth control medication she takes for her dysmenorrhea.    Past Medical History:  Diagnosis Date   Brain stem lesion    Being followed [facility] Neurosurgical Dept   Gestational diabetes    History of gestational diabetes 11/08/2016   Early a1c and CMP negative. Normal 3rd trimester 2 hr GTT.   Hypothyroidism    PILAR CYST 02/27/2007   Qualifier: Diagnosis of  By: Tarry Farmer MD, Wayne     Ptosis 06/09/2016   Vitamin D  deficiency     Past Surgical History:  Procedure Laterality Date   CESAREAN SECTION N/A 06/01/2017   Procedure: CESAREAN SECTION  Patient has Brain Stem Lesion, will require General anesthesia;  Surgeon: Lenord Radon,  MD;  Location: [facility] BIRTHING SUITES;  Service: Obstetrics;  Laterality: N/A;   DILATION AND EVACUATION N/A 02/13/2015   Procedure: DILATATION AND EVACUATION;  Surgeon: Malka Sea, DO;  Location: [facility] ORS;  Service: Gynecology;  Laterality: N/A;   EYE SURGERY      Family History  Problem Relation Age of Onset   Prostate cancer Father     Social History   Socioeconomic History   Marital status: Married    Spouse name: Not on file   Number of children: Not on file   Years of education: Not on file   Highest education level: Not on file  Occupational History   Not on file  Tobacco Use   Smoking status: Never   Smokeless tobacco: Never  Substance and Sexual Activity   Alcohol use: No    Alcohol/week: 0.0 standard drinks of alcohol   Drug use: No   Sexual activity: Yes    Birth control/protection: None  Other Topics Concern   Not on file  Social History Narrative   Not on file   Social Drivers of Health   Financial Resource Strain: Not on file  Food Insecurity: Food Insecurity Present (12/04/2018)   Hunger Vital Sign    Worried About Running Out of Food in the Last Year: Often true    Ran Out of Food in the Last Year: Often true  Transportation Needs: No Transportation Needs (12/04/2018)   PRAPARE - Administrator, Civil Service (Medical): No    Lack of Transportation (Non-Medical): No  Physical Activity: Not on file  Stress: Not on file  Social Connections: Not on file    No Known Allergies  Outpatient Medications Prior to Visit  Medication Sig Dispense Refill   norethindrone-ethinyl estradiol-FE (JUNEL FE 1/20) 1-20 MG-MCG tablet Take 1 tablet by mouth daily. 90 tablet 3   acetaminophen -codeine  (TYLENOL  #3) 300-30 MG tablet Take 1-2 tablets by mouth every 4 (four) hours as needed for moderate pain. (Patient not taking: Reported on 03/13/2024) 30 tablet 0   cyclobenzaprine  (FLEXERIL ) 10 MG tablet Take 1 tablet (10 mg total) by mouth 2 (two) times daily as  needed for muscle spasms. (Patient not taking: Reported on 03/13/2024) 30 tablet 1   levothyroxine  (SYNTHROID ) 50 MCG tablet Take 1 tablet (50 mcg total) by mouth daily before breakfast. (Patient not taking: Reported on 03/13/2024) 90 tablet 0   meloxicam  (MOBIC ) 7.5 MG tablet Take 1 tablet (7.5 mg total) by mouth daily. (Patient not taking: Reported on 03/13/2024) 30 tablet 1   terbinafine  (LAMISIL ) 250 MG tablet Take 1 tablet (250 mg total) by mouth daily. (Patient not taking: Reported on 03/13/2024) 30 tablet 1   No facility-administered medications prior to visit.     ROS Review of Systems  Constitutional:  Negative for activity change and appetite change.  HENT:  Negative for sinus pressure and sore throat.   Respiratory:  Negative for chest tightness, shortness of breath and wheezing.   Cardiovascular:  Negative for chest pain and palpitations.  Gastrointestinal:  Negative for abdominal distention, abdominal pain and constipation.  Genitourinary: Negative.   Musculoskeletal: Negative.   Neurological:  Positive for light-headedness.  Psychiatric/Behavioral:  Negative for behavioral problems and dysphoric mood.     Objective:  BP 121/72   Pulse 61   Ht 5\' 3"  (1.6 m)   Wt 156 lb 9.6 oz (71 kg)   SpO2 100%   BMI 27.74 kg/m      03/13/2024    9:58 AM 01/11/2022    9:01 AM 08/31/2021    9:34 AM  BP/Weight  Systolic BP 121 120 97  Diastolic BP 72 80 60  Wt. (Lbs) 156.6 143.4 145.6  BMI 27.74 kg/m2 25.4 kg/m2 25.79 kg/m2      Physical Exam Constitutional:      Appearance: She is well-developed.  HENT:     Right Ear: Tympanic membrane normal.     Left Ear: Tympanic membrane normal.     Mouth/Throat:     Mouth: Mucous membranes are moist.  Cardiovascular:     Rate and Rhythm: Normal rate.     Heart sounds: Normal heart sounds. No murmur heard. Pulmonary:     Effort: Pulmonary effort is normal.     Breath sounds: Normal breath sounds. No wheezing or rales.  Chest:      Chest wall: No tenderness.  Abdominal:     General: Bowel sounds are normal. There is no distension.     Palpations: Abdomen is soft. There is no mass.     Tenderness: There is no abdominal tenderness.  Musculoskeletal:        General: Normal range of motion.     Right lower leg: No edema.     Left lower leg: No edema.  Neurological:     Mental Status: She is alert and oriented to person, place, and time.     Motor: No weakness.     Coordination: Coordination normal.     Gait: Gait normal.  Psychiatric:        Mood and Affect: Mood normal.  Latest Ref Rng & Units 05/05/2021   11:08 AM 05/18/2019    3:41 PM 09/27/2018    4:55 PM  CMP  Glucose 65 - 99 mg/dL 94  376  283   BUN 6 - 24 mg/dL 10  6  10    Creatinine 0.57 - 1.00 mg/dL 1.51  7.61  6.07   Sodium 134 - 144 mmol/L 138  139  136   Potassium 3.5 - 5.2 mmol/L 4.2  4.2  3.4   Chloride 96 - 106 mmol/L 102  104  99   CO2 20 - 29 mmol/L 24  26  22    Calcium 8.7 - 10.2 mg/dL 9.4  8.7  9.4   Total Protein 6.0 - 8.5 g/dL 7.0  6.6  7.9   Total Bilirubin 0.0 - 1.2 mg/dL 0.5  0.4  1.0   Alkaline Phos 44 - 121 IU/L 63  51  78   AST 0 - 40 IU/L 24  31  32   ALT 0 - 32 IU/L 19  20  18      Lipid Panel     Component Value Date/Time   CHOL 178 05/05/2021 1108   TRIG 132 05/05/2021 1108   HDL 57 05/05/2021 1108   CHOLHDL 3.1 05/05/2021 1108   CHOLHDL 2.9 06/09/2016 1025   VLDL 23 06/09/2016 1025   LDLCALC 98 05/05/2021 1108    CBC    Component Value Date/Time   WBC 6.5 05/05/2021 1108   WBC 3.5 (L) 05/18/2019 1541   RBC 4.63 05/05/2021 1108   RBC 4.17 05/18/2019 1541   HGB 13.3 05/05/2021 1108   HCT 39.9 05/05/2021 1108   PLT 198 05/05/2021 1108   MCV 86 05/05/2021 1108   MCH 28.7 05/05/2021 1108   MCH 29.3 05/18/2019 1541   MCHC 33.3 05/05/2021 1108   MCHC 33.2 05/18/2019 1541   RDW 12.4 05/05/2021 1108   LYMPHSABS 1.9 05/05/2021 1108   MONOABS 0.3 05/18/2019 1541   EOSABS 0.1 05/05/2021 1108   BASOSABS 0.1  05/05/2021 1108    Lab Results  Component Value Date   HGBA1C 5.7 (H) 05/05/2021       Assessment & Plan Annual physical exam with abnormal findings  Routine wellness visit. Discussed exercise routine, dietary habits, and preventive care. She waived mammogram, opting for self-examinations. - Order blood work to assess current health status. -Up-to-date on Pap smear, next Pap smear due in 2027 - Review results via MyChart communication. - Discuss results and follow-up in one year.  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 Recent severe dizziness episode noted. No recent neurosurgery follow-up due to lack of medical coverage-patient's decision - Order complete blood count to rule out anemia as a cause of dizziness. - Consider neurosurgery follow-up if symptoms worsen.  Hypothyroidism Hypothyroidism with nonadherence to medication. She stopped medication last year due to uncertainty about treatment necessity. Occasional dizziness reported. - Order thyroid function tests to assess current status. - Review thyroid function test results via Mit[facility].   Prediabetes Prediabetes managed through exercise and diet. No hyperglycemia symptoms,  Last A1c was 5.7 in 2022 - Order blood work to assess current A1c and blood sugar levels.         No orders of the defined types were placed in this encounter.   Follow-up: No follow-ups on file.       Joaquin Mulberry, MD, FAAFP. [facility] [place], [place] [phone]   03/13/2024, 10:03 AM

## 2024-03-13 NOTE — Patient Instructions (Signed)
 VISIT SUMMARY:  During your routine checkup, we discussed your prediabetes, thyroid issues, and brain lesion. You reported feeling generally well and have been managing your prediabetes with regular exercise. You expressed concerns about your blood sugar levels and occasional dizziness. We also discussed your thyroid medication and the need for a refill of your birth control medication.  YOUR PLAN:  -BRAIN LESION: A brain lesion is an area of abnormal tissue in the brain. Your symptoms have improved with regular exercise, but you experienced a recent episode of severe dizziness. We will order a complete blood count to rule out anemia as a cause of your dizziness. If your symptoms worsen, consider following up with neurosurgery.  -HYPOTHYROIDISM: Hypothyroidism is a condition where your thyroid gland does not produce enough thyroid hormone. You stopped taking your thyroid medication last year due to uncertainty about your diagnosis. We will order thyroid function tests to assess your current status and discuss the results via Mitrax.   -PREDIABETES: Prediabetes is a condition where your blood sugar levels are higher than normal but not high enough to be classified as diabetes. You have been managing it well with exercise and diet. We will order blood work to assess your current A1c and blood sugar levels and review the results via Mitrax.  -WELLNESS VISIT: This was a routine wellness visit where we discussed your exercise routine, dietary habits, and preventive care. You opted to waive the mammogram in favor of self-examinations. We will order blood work to assess your current health status and review the results via Mitrax. We will discuss the results and follow up in one year.  INSTRUCTIONS:  We will review the results of your blood work and thyroid function tests via Mitrax communication. If your symptoms worsen, especially dizziness, consider following up with neurosurgery. We will discuss the  results of your tests and follow up in one year.

## 2024-03-14 ENCOUNTER — Encounter: Payer: Self-pay | Admitting: Family Medicine

## 2024-03-14 LAB — CBC WITH DIFFERENTIAL/PLATELET
Basophils Absolute: 0.1 10*3/uL (ref 0.0–0.2)
Basos: 1 %
EOS (ABSOLUTE): 0.3 10*3/uL (ref 0.0–0.4)
Eos: 4 %
Hematocrit: 43 % (ref 34.0–46.6)
Hemoglobin: 14 g/dL (ref 11.1–15.9)
Immature Grans (Abs): 0 10*3/uL (ref 0.0–0.1)
Immature Granulocytes: 0 %
Lymphocytes Absolute: 2.2 10*3/uL (ref 0.7–3.1)
Lymphs: 30 %
MCH: 29 pg (ref 26.6–33.0)
MCHC: 32.6 g/dL (ref 31.5–35.7)
MCV: 89 fL (ref 79–97)
Monocytes Absolute: 0.5 10*3/uL (ref 0.1–0.9)
Monocytes: 8 %
Neutrophils Absolute: 4.2 10*3/uL (ref 1.4–7.0)
Neutrophils: 57 %
Platelets: 194 10*3/uL (ref 150–450)
RBC: 4.83 x10E6/uL (ref 3.77–5.28)
RDW: 12.8 % (ref 11.7–15.4)
WBC: 7.2 10*3/uL (ref 3.4–10.8)

## 2024-03-14 LAB — CMP14+EGFR
ALT: 19 IU/L (ref 0–32)
AST: 32 IU/L (ref 0–40)
Albumin: 4.5 g/dL (ref 3.9–4.9)
Alkaline Phosphatase: 65 IU/L (ref 44–121)
BUN/Creatinine Ratio: 15 (ref 9–23)
BUN: 12 mg/dL (ref 6–24)
Bilirubin Total: 0.6 mg/dL (ref 0.0–1.2)
CO2: 22 mmol/L (ref 20–29)
Calcium: 9.3 mg/dL (ref 8.7–10.2)
Chloride: 102 mmol/L (ref 96–106)
Creatinine, Ser: 0.82 mg/dL (ref 0.57–1.00)
Globulin, Total: 2.6 g/dL (ref 1.5–4.5)
Glucose: 93 mg/dL (ref 70–99)
Potassium: 4.8 mmol/L (ref 3.5–5.2)
Sodium: 139 mmol/L (ref 134–144)
Total Protein: 7.1 g/dL (ref 6.0–8.5)
eGFR: 91 mL/min/{1.73_m2} (ref >59)

## 2024-03-14 LAB — TSH: TSH: 3.02 u[IU]/mL (ref 0.450–4.500)

## 2024-03-14 LAB — LP+NON-HDL CHOLESTEROL
Cholesterol, Total: 185 mg/dL (ref 100–199)
HDL: 61 mg/dL (ref >39)
LDL Chol Calc (NIH): 105 mg/dL — ABNORMAL HIGH (ref 0–99)
Total Non-HDL-Chol (LDL+VLDL): 124 mg/dL (ref 0–129)
Triglycerides: 107 mg/dL (ref 0–149)
VLDL Cholesterol Cal: 19 mg/dL (ref 5–40)

## 2024-03-14 LAB — T4, FREE: Free T4: 1.06 ng/dL (ref 0.82–1.77)

## 2024-03-14 LAB — HEMOGLOBIN A1C
Est. average glucose Bld gHb Est-mCnc: 117 mg/dL
Hgb A1c MFr Bld: 5.7 % — ABNORMAL HIGH (ref 4.8–5.6)

## 2024-03-14 LAB — T3: T3, Total: 120 ng/dL (ref 71–180)

## 2024-04-02 ENCOUNTER — Other Ambulatory Visit: Payer: Self-pay | Admitting: Family Medicine

## 2024-04-02 DIAGNOSIS — N946 Dysmenorrhea, unspecified: Secondary | ICD-10-CM

## 2024-06-21 ENCOUNTER — Telehealth: Payer: Self-pay | Admitting: Family Medicine

## 2024-06-21 NOTE — Telephone Encounter (Signed)
 Called patient to confirm upcoming appointment 06/24/2024 at 3:50 pm. Patient appointment has been successfully confirmed

## 2024-06-24 ENCOUNTER — Ambulatory Visit: Payer: Self-pay | Attending: Family Medicine | Admitting: Family Medicine

## 2024-06-24 ENCOUNTER — Encounter: Payer: Self-pay | Admitting: Family Medicine

## 2024-06-24 ENCOUNTER — Other Ambulatory Visit: Payer: Self-pay

## 2024-06-24 VITALS — BP 116/74 | HR 60 | Ht 63.0 in | Wt 155.0 lb

## 2024-06-24 DIAGNOSIS — R102 Pelvic and perineal pain: Secondary | ICD-10-CM

## 2024-06-24 DIAGNOSIS — N946 Dysmenorrhea, unspecified: Secondary | ICD-10-CM

## 2024-06-24 DIAGNOSIS — K5900 Constipation, unspecified: Secondary | ICD-10-CM

## 2024-06-24 DIAGNOSIS — G8929 Other chronic pain: Secondary | ICD-10-CM

## 2024-06-24 MED ORDER — POLYETHYLENE GLYCOL 3350 17 GM/SCOOP PO POWD
17.0000 g | Freq: Every day | ORAL | 1 refills | Status: AC
  Filled 2024-06-24: qty 510, 30d supply, fill #0

## 2024-06-24 MED ORDER — NORETHIN ACE-ETH ESTRAD-FE 1-20 MG-MCG PO TABS
1.0000 | ORAL_TABLET | Freq: Every day | ORAL | 3 refills | Status: AC
  Filled 2024-06-24: qty 112, 112d supply, fill #0

## 2024-06-24 NOTE — Patient Instructions (Signed)
 VISIT SUMMARY:  Today, you were seen for severe pelvic pain and constipation. You described the pelvic pain as 'labor pains' that have been ongoing for the past two weeks, primarily in the left lower quadrant, and sometimes radiating to both sides. The pain is very intense, reaching a 10 out of 10 in severity, and is not related to your menstrual cycle. You also reported new onset constipation for the past four days, with painful bowel movements and straining.  YOUR PLAN:  -CHRONIC PELVIC PAIN WITH DYSMENORRHEA: Chronic pelvic pain with dysmenorrhea means you have long-term pelvic pain associated with menstrual cramps. Your pain is severe and not linked to menstrual bleeding due to continuous birth control use. We will order a pelvic ultrasound to investigate further. If the ultrasound is inconclusive or the pain persists, you will be referred to a gynecologist. You should continue taking your birth control pills to manage menstrual pain. You may also consider taking Aleve  or ibuprofen  for pain relief, but be aware of potential side effects. Your birth control prescription has been sent to the pharmacy.  -CONSTIPATION: Constipation means you are having difficulty with bowel movements, which can be painful and involve straining. This may be related to your diet and premenopausal status. We have prescribed Miralax  for daily use to help relieve constipation. You should also increase your dietary fiber intake by eating foods like oatmeal and spinach, and consider adding Activia yogurt for probiotics. Regular consumption of vegetables is also recommended.  INSTRUCTIONS:  Please follow up after your pelvic ultrasound or sooner if your pain worsens. If the ultrasound does not provide clear answers, we will refer you to a gynecologist. Continue with the prescribed medications and dietary recommendations, and monitor your symptoms closely.

## 2024-06-24 NOTE — Progress Notes (Signed)
 Subjective:  Patient ID: Shelly Greene, female    DOB: 1980/12/28  Age: 43 y.o. MRN: [id number]  CC: Abdominal Pain (2 weeks /Constipation for 4 days)     Discussed the use of AI scribe software for clinical note transcription with the patient, who gave verbal consent to proceed.  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  She experiences severe pelvic pain described as 'labor pains' for the past two weeks, primarily in the left lower quadrant, occasionally radiating bilaterally. The pain is squeezing and twisting, with episodes reaching 10 out of 10 in severity.She has not menstruated for a year due to continuous birth control pill use.  Shee skips the placebo pills and just takes the active pills.  She has been on birth control pills for dysmenorrhea.  Cold and hot sensations occur during pain episodes, but there is no bleeding. Physical activities and intercourse exacerbate the pain.  She has been on birth control pills for six years to manage menstrual pain, initially effective but now requiring daily Tylenol  for relief. She takes two 500 mg Tylenol  tablets daily, which alleviates the pain for most of the day. She has a history of severe menstrual cramps and has considered stopping birth control due to pain.  She reports new onset constipation for four days, with painful bowel movements and straining. Her diet is consistent, and she attempts to eat healthily due to prediabetes. She has not used laxatives but is considering increasing fiber intake.  Her past medical history includes a C-section and tubal ligation at age 40, after which pelvic pain began. A Pap smear in 2022 was negative. Previous ultrasounds from 2019 showed no abnormalities. There is no heavy bleeding or  fever, but she experiences nausea and painful intercourse.    Past Medical History:  Diagnosis Date   Brain stem lesion    Being followed [facility]   Gestational diabetes    History of gestational diabetes 11/08/2016   Early a1c and CMP negative. Normal 3rd trimester 2 hr GTT.   Hypothyroidism    PILAR CYST 02/27/2007   Qualifier: Diagnosis of  By: Levonne MD, Wayne     Ptosis 06/09/2016   Vitamin D  deficiency     Past Surgical History:  Procedure Laterality Date   CESAREAN SECTION N/A 06/01/2017   Procedure: CESAREAN SECTION  Patient has Brain Stem Lesion, will require General anesthesia;  Surgeon: Corene Coy, MD;  Location: [facility];  Service: Obstetrics;  Laterality: N/A;   DILATION AND EVACUATION N/A 02/13/2015   Procedure: DILATATION AND EVACUATION;  Surgeon: Lang JINNY Peel, DO;  Location: [facility] OR[facility];  Service: Gynecology;  Laterality: N/A;   EYE SURGERY      Family History  Problem Relation Age of Onset   Prostate cancer Father     Social History   Socioeconomic History   Marital status: Married    Spouse name: Not on file   Number of children: Not on file   Years of education: Not on file   Highest education level: Not on file  Occupational History   Not on file  Tobacco Use   Smoking status: Never   Smokeless tobacco: Never  Substance and Sexual Activity   Alcohol use: No    Alcohol/week: 0.0 standard drinks of alcohol   Drug use: No  Sexual activity: Yes    Birth control/protection: None  Other Topics Concern   Not on file  Social History Narrative   Not on file   Social Drivers of Health   Financial Resource Strain: Not on file  Food Insecurity: Food Insecurity Present (12/04/2018)   Hunger Vital Sign    Worried About Running Out of Food in the Last Year: Often true    Ran Out of Food in the Last Year: Often true  Transportation Needs: No Transportation Needs (12/04/2018)   PRAPARE - Doctor, general practice (Medical): No    Lack of Transportation (Non-Medical): No  Physical Activity: Not on file  Stress: Not on file  Social Connections: Not on file    No Known Allergies  Outpatient Medications Prior to Visit  Medication Sig Dispense Refill   norethindrone -ethinyl estradiol -FE (JUNEL  FE 1/20) 1-20 MG-MCG tablet Take 1 tablet by mouth daily. 90 tablet 3   levothyroxine  (SYNTHROID ) 50 MCG tablet Take 1 tablet (50 mcg total) by mouth daily before breakfast. (Patient not taking: Reported on 06/24/2024) 90 tablet 0   No facility-administered medications prior to visit.     ROS Review of Systems  Constitutional:  Negative for activity change and appetite change.  HENT:  Negative for sinus pressure and sore throat.   Respiratory:  Negative for chest tightness, shortness of breath and wheezing.   Cardiovascular:  Negative for chest pain and palpitations.  Gastrointestinal:  Positive for constipation and nausea. Negative for abdominal distention and abdominal pain.  Genitourinary: Negative.   Musculoskeletal: Negative.   Psychiatric/Behavioral:  Negative for behavioral problems and dysphoric mood.     Objective:  BP 116/74   Pulse 60   Ht 5' 3 (1.6 m)   Wt 155 lb (70.3 kg)   SpO2 99%   BMI 27.46 kg/m      06/24/2024    4:13 PM 03/13/2024    9:58 AM 01/11/2022    9:01 AM  BP/Weight  Systolic BP 116 121 120  Diastolic BP 74 72 80  Wt. (Lbs) 155 156.6 143.4  BMI 27.46 kg/m2 27.74 kg/m2 25.4 kg/m2      Physical Exam Constitutional:      Appearance: She is well-developed.  Cardiovascular:     Rate and Rhythm: Normal rate.     Heart sounds: Normal heart sounds. No murmur heard. Pulmonary:     Effort: Pulmonary effort is normal.     Breath sounds: Normal breath sounds. No wheezing or rales.  Chest:     Chest wall: No tenderness.  Abdominal:     General: Bowel sounds are normal. There is no distension.     Palpations: Abdomen is soft. There is no mass.      Tenderness: There is no abdominal tenderness.  Musculoskeletal:        General: Normal range of motion.     Right lower leg: No edema.     Left lower leg: No edema.  Neurological:     Mental Status: She is alert and oriented to person, place, and time.  Psychiatric:        Mood and Affect: Mood normal.        Latest Ref Rng & Units 03/13/2024   10:23 AM 05/05/2021   11:08 AM 05/18/2019    3:41 PM  CMP  Glucose 70 - 99 mg/dL 93  94  898   BUN 6 - 24 mg/dL 12  10  6    Creatinine 0.57 -  1.00 mg/dL 9.17  9.29  9.39   Sodium 134 - 144 mmol/L 139  138  139   Potassium 3.5 - 5.2 mmol/L 4.8  4.2  4.2   Chloride 96 - 106 mmol/L 102  102  104   CO2 20 - 29 mmol/L 22  24  26    Calcium 8.7 - 10.2 mg/dL 9.3  9.4  8.7   Total Protein 6.0 - 8.5 g/dL 7.1  7.0  6.6   Total Bilirubin 0.0 - 1.2 mg/dL 0.6  0.5  0.4   Alkaline Phos 44 - 121 IU/L 65  63  51   AST 0 - 40 IU/L 32  24  31   ALT 0 - 32 IU/L 19  19  20      Lipid Panel     Component Value Date/Time   CHOL 185 03/13/2024 1023   TRIG 107 03/13/2024 1023   HDL 61 03/13/2024 1023   CHOLHDL 3.1 05/05/2021 1108   CHOLHDL 2.9 06/09/2016 1025   VLDL 23 06/09/2016 1025   LDLCALC 105 (H) 03/13/2024 1023    CBC    Component Value Date/Time   WBC 7.2 03/13/2024 1023   WBC 3.5 (L) 05/18/2019 1541   RBC 4.83 03/13/2024 1023   RBC 4.17 05/18/2019 1541   HGB 14.0 03/13/2024 1023   HCT 43.0 03/13/2024 1023   PLT 194 03/13/2024 1023   MCV 89 03/13/2024 1023   MCH 29.0 03/13/2024 1023   MCH 29.3 05/18/2019 1541   MCHC 32.6 03/13/2024 1023   MCHC 33.2 05/18/2019 1541   RDW 12.8 03/13/2024 1023   LYMPHSABS 2.2 03/13/2024 1023   MONOABS 0.3 05/18/2019 1541   EOSABS 0.3 03/13/2024 1023   BASOSABS 0.1 03/13/2024 1023    Lab Results  Component Value Date   HGBA1C 5.7 (H) 03/13/2024       Assessment & Plan Chronic pelvic pain with dysmenorrhea Severe left lower quadrant pain, not linked to menstrual bleeding due to continuous  birth control. Differential includes possible endometriosis or hernia. Previous imaging unremarkable, symptoms worsening. Associated with nausea, temperature changes, dyspareunia. Concern about daily Tylenol  use. - Order pelvic ultrasound. - Refer to GYN if ultrasound inconclusive or pain persists. - Continue birth control for dysmenorrhea management. - Consider Aleve  or ibuprofen , noting adverse effects with ibuprofen . - Send birth control prescription to pharmacy.  Constipation Recent onset, likely dietary and premenopausal. No laxative use, aware of dietary modification needs. - Prescribe Miralax  daily. - Advise increasing dietary fiber, including oatmeal and spinach. - Recommend Activia yogurt for probiotics. - Encourage regular vegetable consumption.    Meds ordered this encounter  Medications   polyethylene glycol powder (GLYCOLAX /MIRALAX ) 17 GM/SCOOP powder    Sig: Take 17 g by mouth daily.    Dispense:  3350 g    Refill:  1   norethindrone -ethinyl estradiol -FE (JUNEL  FE 1/20) 1-20 MG-MCG tablet    Sig: Take 1 tablet by mouth daily.    Dispense:  90 tablet    Refill:  3    Follow-up: Return in about 6 months (around 12/25/2024) for Chronic medical conditions, cancel previous appointment.       Corrina Sabin, MD, FAAFP. [facility] [place], [place] [phone]   06/24/2024, 4:37 PM

## 2024-07-03 ENCOUNTER — Other Ambulatory Visit: Payer: Self-pay

## 2024-07-03 ENCOUNTER — Ambulatory Visit (HOSPITAL_COMMUNITY)
Admission: RE | Admit: 2024-07-03 | Discharge: 2024-07-03 | Disposition: A | Discharge status: Home / Self Care | Payer: Self-pay | Source: Ambulatory Visit | Attending: Family Medicine | Admitting: Family Medicine

## 2024-07-03 DIAGNOSIS — N946 Dysmenorrhea, unspecified: Secondary | ICD-10-CM | POA: Insufficient documentation

## 2024-07-03 DIAGNOSIS — R102 Pelvic and perineal pain: Secondary | ICD-10-CM | POA: Insufficient documentation

## 2024-07-14 ENCOUNTER — Encounter: Payer: Self-pay | Admitting: Family Medicine

## 2024-07-15 ENCOUNTER — Ambulatory Visit: Payer: Self-pay | Admitting: Family Medicine

## 2024-07-15 DIAGNOSIS — R102 Pelvic and perineal pain: Secondary | ICD-10-CM

## 2024-07-15 DIAGNOSIS — N946 Dysmenorrhea, unspecified: Secondary | ICD-10-CM

## 2024-07-17 ENCOUNTER — Ambulatory Visit: Payer: Self-pay | Admitting: Family Medicine
